# Patient Record
Sex: Female | Born: 1935 | Race: Black or African American | Hispanic: No | Smoking: Former smoker
Health system: Southern US, Community
[De-identification: ages and names within clinical notes are randomized; demographics above are authoritative.]

## PROBLEM LIST (undated history)

## (undated) DIAGNOSIS — I10 Essential (primary) hypertension: Secondary | ICD-10-CM

## (undated) DIAGNOSIS — I639 Cerebral infarction, unspecified: Secondary | ICD-10-CM

## (undated) DIAGNOSIS — N189 Chronic kidney disease, unspecified: Secondary | ICD-10-CM

## (undated) DIAGNOSIS — I2510 Atherosclerotic heart disease of native coronary artery without angina pectoris: Secondary | ICD-10-CM

## (undated) DIAGNOSIS — M069 Rheumatoid arthritis, unspecified: Secondary | ICD-10-CM

## (undated) DIAGNOSIS — I2721 Secondary pulmonary arterial hypertension: Secondary | ICD-10-CM

## (undated) DIAGNOSIS — I4891 Unspecified atrial fibrillation: Secondary | ICD-10-CM

## (undated) HISTORY — DX: Essential (primary) hypertension: I10

## (undated) HISTORY — DX: Secondary pulmonary arterial hypertension: I27.21

## (undated) HISTORY — DX: Unspecified atrial fibrillation: I48.91

## (undated) HISTORY — DX: Rheumatoid arthritis, unspecified: M06.9

## (undated) HISTORY — DX: Chronic kidney disease, unspecified: N18.9

---

## 1998-05-07 ENCOUNTER — Encounter: Admission: RE | Admit: 1998-05-07 | Discharge: 1998-05-07 | Payer: Self-pay | Admitting: Internal Medicine

## 1998-05-18 ENCOUNTER — Encounter: Admission: RE | Admit: 1998-05-18 | Discharge: 1998-05-18 | Payer: Self-pay | Admitting: Internal Medicine

## 1998-05-28 ENCOUNTER — Encounter: Admission: RE | Admit: 1998-05-28 | Discharge: 1998-05-28 | Payer: Self-pay | Admitting: Internal Medicine

## 1998-08-05 ENCOUNTER — Encounter: Admission: RE | Admit: 1998-08-05 | Discharge: 1998-08-05 | Payer: Self-pay | Admitting: Internal Medicine

## 1999-01-19 ENCOUNTER — Encounter: Admission: RE | Admit: 1999-01-19 | Discharge: 1999-01-19 | Payer: Self-pay | Admitting: Hematology and Oncology

## 1999-07-19 ENCOUNTER — Encounter: Admission: RE | Admit: 1999-07-19 | Discharge: 1999-07-19 | Payer: Self-pay | Admitting: Internal Medicine

## 2000-01-10 ENCOUNTER — Ambulatory Visit (HOSPITAL_COMMUNITY): Admission: RE | Admit: 2000-01-10 | Discharge: 2000-01-10 | Payer: Self-pay | Admitting: Internal Medicine

## 2000-01-10 ENCOUNTER — Encounter: Admission: RE | Admit: 2000-01-10 | Discharge: 2000-01-10 | Payer: Self-pay | Admitting: Internal Medicine

## 2000-01-10 ENCOUNTER — Encounter: Payer: Self-pay | Admitting: Internal Medicine

## 2000-07-06 ENCOUNTER — Encounter: Admission: RE | Admit: 2000-07-06 | Discharge: 2000-07-06 | Payer: Self-pay | Admitting: Hematology and Oncology

## 2000-07-21 ENCOUNTER — Encounter: Admission: RE | Admit: 2000-07-21 | Discharge: 2000-07-21 | Payer: Self-pay | Admitting: Internal Medicine

## 2000-08-31 ENCOUNTER — Encounter: Admission: RE | Admit: 2000-08-31 | Discharge: 2000-08-31 | Payer: Self-pay | Admitting: Hematology and Oncology

## 2000-12-28 ENCOUNTER — Encounter: Admission: RE | Admit: 2000-12-28 | Discharge: 2000-12-28 | Payer: Self-pay | Admitting: Hematology and Oncology

## 2001-01-24 ENCOUNTER — Encounter: Admission: RE | Admit: 2001-01-24 | Discharge: 2001-01-24 | Payer: Self-pay | Admitting: Hematology and Oncology

## 2001-01-29 HISTORY — PX: CARDIAC CATHETERIZATION: SHX172

## 2001-01-31 ENCOUNTER — Encounter: Admission: RE | Admit: 2001-01-31 | Discharge: 2001-01-31 | Payer: Self-pay | Admitting: Hematology and Oncology

## 2001-02-15 ENCOUNTER — Encounter: Admission: RE | Admit: 2001-02-15 | Discharge: 2001-02-15 | Payer: Self-pay | Admitting: Hematology and Oncology

## 2001-02-15 ENCOUNTER — Ambulatory Visit (HOSPITAL_COMMUNITY): Admission: RE | Admit: 2001-02-15 | Discharge: 2001-02-15 | Payer: Self-pay | Admitting: Hematology and Oncology

## 2001-02-17 ENCOUNTER — Encounter: Payer: Self-pay | Admitting: Emergency Medicine

## 2001-02-17 ENCOUNTER — Inpatient Hospital Stay (HOSPITAL_COMMUNITY): Admission: EM | Admit: 2001-02-17 | Discharge: 2001-02-21 | Payer: Self-pay | Admitting: Emergency Medicine

## 2001-02-20 ENCOUNTER — Encounter: Payer: Self-pay | Admitting: *Deleted

## 2001-03-02 ENCOUNTER — Encounter: Admission: RE | Admit: 2001-03-02 | Discharge: 2001-03-02 | Payer: Self-pay | Admitting: Internal Medicine

## 2001-04-02 ENCOUNTER — Encounter: Admission: RE | Admit: 2001-04-02 | Discharge: 2001-04-02 | Payer: Self-pay | Admitting: Internal Medicine

## 2001-07-30 ENCOUNTER — Encounter: Admission: RE | Admit: 2001-07-30 | Discharge: 2001-07-30 | Payer: Self-pay | Admitting: Internal Medicine

## 2001-08-02 ENCOUNTER — Ambulatory Visit (HOSPITAL_COMMUNITY): Admission: RE | Admit: 2001-08-02 | Discharge: 2001-08-02 | Payer: Self-pay | Admitting: Internal Medicine

## 2002-01-14 ENCOUNTER — Encounter: Admission: RE | Admit: 2002-01-14 | Discharge: 2002-01-14 | Payer: Self-pay | Admitting: Internal Medicine

## 2002-02-11 ENCOUNTER — Encounter: Admission: RE | Admit: 2002-02-11 | Discharge: 2002-02-11 | Payer: Self-pay | Admitting: Internal Medicine

## 2002-07-29 ENCOUNTER — Encounter: Admission: RE | Admit: 2002-07-29 | Discharge: 2002-07-29 | Payer: Self-pay | Admitting: Internal Medicine

## 2002-07-29 ENCOUNTER — Ambulatory Visit (HOSPITAL_COMMUNITY): Admission: RE | Admit: 2002-07-29 | Discharge: 2002-07-29 | Payer: Self-pay | Admitting: Internal Medicine

## 2002-08-09 ENCOUNTER — Encounter: Payer: Self-pay | Admitting: Internal Medicine

## 2002-08-09 ENCOUNTER — Encounter: Admission: RE | Admit: 2002-08-09 | Discharge: 2002-08-09 | Payer: Self-pay | Admitting: Internal Medicine

## 2002-08-14 ENCOUNTER — Ambulatory Visit (HOSPITAL_COMMUNITY): Admission: RE | Admit: 2002-08-14 | Discharge: 2002-08-14 | Payer: Self-pay | Admitting: Internal Medicine

## 2002-08-14 ENCOUNTER — Encounter: Payer: Self-pay | Admitting: Cardiology

## 2002-08-19 ENCOUNTER — Encounter: Admission: RE | Admit: 2002-08-19 | Discharge: 2002-08-19 | Payer: Self-pay | Admitting: Internal Medicine

## 2002-09-16 ENCOUNTER — Encounter: Admission: RE | Admit: 2002-09-16 | Discharge: 2002-09-16 | Payer: Self-pay | Admitting: Internal Medicine

## 2003-01-03 ENCOUNTER — Encounter: Admission: RE | Admit: 2003-01-03 | Discharge: 2003-01-03 | Payer: Self-pay | Admitting: Internal Medicine

## 2003-08-07 ENCOUNTER — Encounter: Admission: RE | Admit: 2003-08-07 | Discharge: 2003-08-07 | Payer: Self-pay | Admitting: Internal Medicine

## 2003-08-15 ENCOUNTER — Encounter: Admission: RE | Admit: 2003-08-15 | Discharge: 2003-08-15 | Payer: Self-pay | Admitting: Internal Medicine

## 2004-02-23 ENCOUNTER — Encounter: Admission: RE | Admit: 2004-02-23 | Discharge: 2004-02-23 | Payer: Self-pay | Admitting: Internal Medicine

## 2004-03-03 ENCOUNTER — Encounter: Admission: RE | Admit: 2004-03-03 | Discharge: 2004-03-03 | Payer: Self-pay | Admitting: Internal Medicine

## 2004-05-25 ENCOUNTER — Encounter: Admission: RE | Admit: 2004-05-25 | Discharge: 2004-05-25 | Payer: Self-pay | Admitting: Internal Medicine

## 2004-06-02 ENCOUNTER — Encounter: Admission: RE | Admit: 2004-06-02 | Discharge: 2004-06-02 | Payer: Self-pay | Admitting: *Deleted

## 2004-06-30 ENCOUNTER — Emergency Department (HOSPITAL_COMMUNITY): Admission: EM | Admit: 2004-06-30 | Discharge: 2004-06-30 | Payer: Self-pay | Admitting: Emergency Medicine

## 2004-07-09 ENCOUNTER — Ambulatory Visit: Payer: Self-pay | Admitting: Internal Medicine

## 2004-10-05 ENCOUNTER — Ambulatory Visit: Payer: Self-pay | Admitting: Internal Medicine

## 2004-10-19 ENCOUNTER — Ambulatory Visit: Payer: Self-pay | Admitting: Internal Medicine

## 2004-12-07 ENCOUNTER — Ambulatory Visit (HOSPITAL_COMMUNITY): Admission: RE | Admit: 2004-12-07 | Discharge: 2004-12-07 | Payer: Self-pay | Admitting: Internal Medicine

## 2004-12-07 ENCOUNTER — Ambulatory Visit: Payer: Self-pay | Admitting: Internal Medicine

## 2005-01-17 ENCOUNTER — Ambulatory Visit (HOSPITAL_COMMUNITY): Admission: RE | Admit: 2005-01-17 | Discharge: 2005-01-17 | Payer: Self-pay | Admitting: *Deleted

## 2005-02-25 ENCOUNTER — Ambulatory Visit (HOSPITAL_COMMUNITY): Admission: RE | Admit: 2005-02-25 | Discharge: 2005-02-25 | Payer: Self-pay | Admitting: Orthopedic Surgery

## 2005-03-04 ENCOUNTER — Ambulatory Visit: Payer: Self-pay | Admitting: Internal Medicine

## 2005-04-11 ENCOUNTER — Ambulatory Visit: Payer: Self-pay | Admitting: Internal Medicine

## 2005-06-02 ENCOUNTER — Ambulatory Visit: Payer: Self-pay | Admitting: Internal Medicine

## 2005-06-09 ENCOUNTER — Ambulatory Visit: Payer: Self-pay | Admitting: Internal Medicine

## 2005-06-17 ENCOUNTER — Ambulatory Visit: Payer: Self-pay | Admitting: Internal Medicine

## 2005-06-27 ENCOUNTER — Ambulatory Visit: Payer: Self-pay | Admitting: Cardiology

## 2005-07-05 ENCOUNTER — Ambulatory Visit: Payer: Self-pay | Admitting: Internal Medicine

## 2005-07-14 ENCOUNTER — Ambulatory Visit: Payer: Self-pay | Admitting: Internal Medicine

## 2005-10-13 ENCOUNTER — Ambulatory Visit: Payer: Self-pay | Admitting: Internal Medicine

## 2006-03-01 ENCOUNTER — Ambulatory Visit: Payer: Self-pay | Admitting: Internal Medicine

## 2006-03-15 ENCOUNTER — Encounter: Admission: RE | Admit: 2006-03-15 | Discharge: 2006-03-15 | Payer: Self-pay | Admitting: Internal Medicine

## 2006-04-20 ENCOUNTER — Encounter: Admission: RE | Admit: 2006-04-20 | Discharge: 2006-04-20 | Payer: Self-pay | Admitting: Otolaryngology

## 2006-05-09 ENCOUNTER — Ambulatory Visit: Payer: Self-pay | Admitting: Hospitalist

## 2006-08-22 ENCOUNTER — Ambulatory Visit: Payer: Self-pay | Admitting: Internal Medicine

## 2006-08-22 ENCOUNTER — Encounter (INDEPENDENT_AMBULATORY_CARE_PROVIDER_SITE_OTHER): Payer: Self-pay | Admitting: *Deleted

## 2006-08-22 LAB — CONVERTED CEMR LAB
ALT: 17 units/L (ref 0–40)
HCT: 41.1 % (ref 36.0–46.0)
Hemoglobin: 14.1 g/dL (ref 12.0–15.0)
MCV: 89.5 fL (ref 78.0–100.0)
Potassium: 3.3 meq/L — ABNORMAL LOW (ref 3.5–5.3)
RBC: 4.59 M/uL (ref 3.87–5.11)
TSH: 3.217 microintl units/mL (ref 0.350–5.50)

## 2006-11-13 ENCOUNTER — Encounter: Admission: RE | Admit: 2006-11-13 | Discharge: 2006-11-13 | Payer: Self-pay | Admitting: Orthopedic Surgery

## 2007-01-23 ENCOUNTER — Telehealth: Payer: Self-pay | Admitting: *Deleted

## 2007-01-24 ENCOUNTER — Telehealth: Payer: Self-pay | Admitting: *Deleted

## 2007-02-06 ENCOUNTER — Ambulatory Visit: Payer: Self-pay | Admitting: *Deleted

## 2007-02-06 DIAGNOSIS — M1A9XX1 Chronic gout, unspecified, with tophus (tophi): Secondary | ICD-10-CM

## 2007-02-06 DIAGNOSIS — N259 Disorder resulting from impaired renal tubular function, unspecified: Secondary | ICD-10-CM | POA: Insufficient documentation

## 2007-02-06 DIAGNOSIS — I10 Essential (primary) hypertension: Secondary | ICD-10-CM | POA: Insufficient documentation

## 2007-02-06 DIAGNOSIS — E785 Hyperlipidemia, unspecified: Secondary | ICD-10-CM

## 2007-02-06 DIAGNOSIS — K449 Diaphragmatic hernia without obstruction or gangrene: Secondary | ICD-10-CM | POA: Insufficient documentation

## 2007-02-06 DIAGNOSIS — K219 Gastro-esophageal reflux disease without esophagitis: Secondary | ICD-10-CM

## 2007-02-06 DIAGNOSIS — R5381 Other malaise: Secondary | ICD-10-CM | POA: Insufficient documentation

## 2007-02-06 DIAGNOSIS — H409 Unspecified glaucoma: Secondary | ICD-10-CM | POA: Insufficient documentation

## 2007-02-06 DIAGNOSIS — E876 Hypokalemia: Secondary | ICD-10-CM | POA: Insufficient documentation

## 2007-02-06 DIAGNOSIS — J84111 Idiopathic interstitial pneumonia, not otherwise specified: Secondary | ICD-10-CM | POA: Insufficient documentation

## 2007-02-06 DIAGNOSIS — R5383 Other fatigue: Secondary | ICD-10-CM

## 2007-02-06 DIAGNOSIS — F411 Generalized anxiety disorder: Secondary | ICD-10-CM | POA: Insufficient documentation

## 2007-02-06 DIAGNOSIS — M069 Rheumatoid arthritis, unspecified: Secondary | ICD-10-CM | POA: Insufficient documentation

## 2007-02-06 LAB — CONVERTED CEMR LAB
Blood Glucose, Fingerstick: 169
Calcium: 8.8 mg/dL (ref 8.4–10.5)
Creatinine, Ser: 1.61 mg/dL — ABNORMAL HIGH (ref 0.40–1.20)
Glucose, Bld: 173 mg/dL — ABNORMAL HIGH (ref 70–99)
Hgb A1c MFr Bld: 5.7 %
Potassium: 3.1 meq/L — ABNORMAL LOW (ref 3.5–5.3)
Sodium: 144 meq/L (ref 135–145)

## 2007-02-13 ENCOUNTER — Encounter (INDEPENDENT_AMBULATORY_CARE_PROVIDER_SITE_OTHER): Payer: Self-pay | Admitting: *Deleted

## 2007-02-21 ENCOUNTER — Ambulatory Visit: Payer: Self-pay | Admitting: Hospitalist

## 2007-02-21 ENCOUNTER — Encounter (INDEPENDENT_AMBULATORY_CARE_PROVIDER_SITE_OTHER): Payer: Self-pay | Admitting: *Deleted

## 2007-02-21 LAB — CONVERTED CEMR LAB
Calcium: 9.3 mg/dL (ref 8.4–10.5)
Chloride: 103 meq/L (ref 96–112)
Cholesterol: 206 mg/dL — ABNORMAL HIGH (ref 0–200)
Creatinine, Ser: 1.62 mg/dL — ABNORMAL HIGH (ref 0.40–1.20)
Glucose, Bld: 124 mg/dL — ABNORMAL HIGH (ref 70–99)
HDL: 60 mg/dL (ref >39)
VLDL: 37 mg/dL (ref 0–40)

## 2007-02-27 ENCOUNTER — Encounter (INDEPENDENT_AMBULATORY_CARE_PROVIDER_SITE_OTHER): Payer: Self-pay | Admitting: *Deleted

## 2007-07-17 ENCOUNTER — Ambulatory Visit: Payer: Self-pay | Admitting: *Deleted

## 2007-07-17 DIAGNOSIS — R634 Abnormal weight loss: Secondary | ICD-10-CM

## 2007-07-17 LAB — CONVERTED CEMR LAB: Hgb A1c MFr Bld: 5.2 %

## 2007-07-19 LAB — CONVERTED CEMR LAB
ALT: 17 units/L (ref 0–35)
Alkaline Phosphatase: 121 units/L — ABNORMAL HIGH (ref 39–117)
Calcium: 9.5 mg/dL (ref 8.4–10.5)
Creatinine, Ser: 1.56 mg/dL — ABNORMAL HIGH (ref 0.40–1.20)
Glucose, Bld: 95 mg/dL (ref 70–99)
Total Bilirubin: 1.1 mg/dL (ref 0.3–1.2)
Total Protein: 7.4 g/dL (ref 6.0–8.3)

## 2007-08-29 ENCOUNTER — Encounter (INDEPENDENT_AMBULATORY_CARE_PROVIDER_SITE_OTHER): Payer: Self-pay | Admitting: *Deleted

## 2007-09-01 HISTORY — PX: OTHER SURGICAL HISTORY: SHX169

## 2007-09-04 ENCOUNTER — Ambulatory Visit: Payer: Self-pay | Admitting: *Deleted

## 2007-09-05 LAB — CONVERTED CEMR LAB
BUN: 26 mg/dL — ABNORMAL HIGH (ref 6–23)
CO2: 24 meq/L (ref 19–32)
Calcium: 9.4 mg/dL (ref 8.4–10.5)
Chloride: 101 meq/L (ref 96–112)
Glucose, Bld: 120 mg/dL — ABNORMAL HIGH (ref 70–99)
Potassium: 3.2 meq/L — ABNORMAL LOW (ref 3.5–5.3)

## 2007-09-10 ENCOUNTER — Ambulatory Visit: Payer: Self-pay | Admitting: Cardiology

## 2007-09-14 ENCOUNTER — Encounter: Payer: Self-pay | Admitting: Internal Medicine

## 2007-09-14 ENCOUNTER — Ambulatory Visit: Payer: Self-pay | Admitting: Internal Medicine

## 2007-09-14 ENCOUNTER — Inpatient Hospital Stay (HOSPITAL_COMMUNITY): Admission: RE | Admit: 2007-09-14 | Discharge: 2007-09-15 | Payer: Self-pay | Admitting: Ophthalmology

## 2007-09-24 ENCOUNTER — Ambulatory Visit: Payer: Self-pay | Admitting: Internal Medicine

## 2007-09-24 DIAGNOSIS — I4891 Unspecified atrial fibrillation: Secondary | ICD-10-CM

## 2007-10-12 ENCOUNTER — Telehealth (INDEPENDENT_AMBULATORY_CARE_PROVIDER_SITE_OTHER): Payer: Self-pay | Admitting: *Deleted

## 2007-10-15 ENCOUNTER — Ambulatory Visit: Payer: Self-pay | Admitting: Internal Medicine

## 2007-10-15 LAB — CONVERTED CEMR LAB: INR: 2

## 2007-10-29 ENCOUNTER — Telehealth: Payer: Self-pay | Admitting: *Deleted

## 2007-11-05 ENCOUNTER — Ambulatory Visit: Payer: Self-pay | Admitting: Internal Medicine

## 2007-11-05 LAB — CONVERTED CEMR LAB: INR: 2.4

## 2007-11-27 ENCOUNTER — Telehealth: Payer: Self-pay | Admitting: *Deleted

## 2007-12-03 ENCOUNTER — Ambulatory Visit: Payer: Self-pay | Admitting: Hospitalist

## 2007-12-03 LAB — CONVERTED CEMR LAB

## 2007-12-15 ENCOUNTER — Emergency Department (HOSPITAL_COMMUNITY): Admission: EM | Admit: 2007-12-15 | Discharge: 2007-12-15 | Payer: Self-pay | Admitting: Emergency Medicine

## 2007-12-24 ENCOUNTER — Ambulatory Visit: Payer: Self-pay | Admitting: Internal Medicine

## 2007-12-24 LAB — CONVERTED CEMR LAB

## 2008-01-03 ENCOUNTER — Ambulatory Visit: Payer: Self-pay | Admitting: *Deleted

## 2008-01-21 ENCOUNTER — Ambulatory Visit: Payer: Self-pay | Admitting: *Deleted

## 2008-01-21 LAB — CONVERTED CEMR LAB: INR: 2.8

## 2008-02-25 ENCOUNTER — Ambulatory Visit: Payer: Self-pay | Admitting: Hospitalist

## 2008-02-26 ENCOUNTER — Telehealth: Payer: Self-pay | Admitting: *Deleted

## 2008-03-11 ENCOUNTER — Ambulatory Visit: Payer: Self-pay | Admitting: *Deleted

## 2008-03-11 ENCOUNTER — Ambulatory Visit (HOSPITAL_COMMUNITY): Admission: RE | Admit: 2008-03-11 | Discharge: 2008-03-11 | Payer: Self-pay | Admitting: *Deleted

## 2008-03-11 ENCOUNTER — Encounter: Payer: Self-pay | Admitting: Pharmacist

## 2008-03-18 ENCOUNTER — Telehealth (INDEPENDENT_AMBULATORY_CARE_PROVIDER_SITE_OTHER): Payer: Self-pay | Admitting: *Deleted

## 2008-03-19 ENCOUNTER — Ambulatory Visit (HOSPITAL_COMMUNITY): Admission: RE | Admit: 2008-03-19 | Discharge: 2008-03-19 | Payer: Self-pay | Admitting: *Deleted

## 2008-03-19 ENCOUNTER — Ambulatory Visit: Payer: Self-pay | Admitting: Cardiovascular Disease

## 2008-03-19 ENCOUNTER — Encounter (INDEPENDENT_AMBULATORY_CARE_PROVIDER_SITE_OTHER): Payer: Self-pay | Admitting: *Deleted

## 2008-03-31 ENCOUNTER — Ambulatory Visit: Payer: Self-pay | Admitting: Internal Medicine

## 2008-03-31 LAB — CONVERTED CEMR LAB

## 2008-04-09 ENCOUNTER — Encounter: Admission: RE | Admit: 2008-04-09 | Discharge: 2008-04-09 | Payer: Self-pay | Admitting: *Deleted

## 2008-04-21 ENCOUNTER — Ambulatory Visit: Payer: Self-pay | Admitting: Internal Medicine

## 2008-04-21 LAB — CONVERTED CEMR LAB: INR: 2.3

## 2008-04-28 ENCOUNTER — Telehealth: Payer: Self-pay | Admitting: *Deleted

## 2008-05-06 ENCOUNTER — Ambulatory Visit: Payer: Self-pay | Admitting: *Deleted

## 2008-05-19 ENCOUNTER — Ambulatory Visit: Payer: Self-pay | Admitting: Infectious Diseases

## 2008-05-19 LAB — CONVERTED CEMR LAB

## 2008-05-30 ENCOUNTER — Telehealth (INDEPENDENT_AMBULATORY_CARE_PROVIDER_SITE_OTHER): Payer: Self-pay | Admitting: *Deleted

## 2008-06-02 ENCOUNTER — Ambulatory Visit: Payer: Self-pay | Admitting: Infectious Diseases

## 2008-06-02 LAB — CONVERTED CEMR LAB: INR: 1.1

## 2008-06-25 ENCOUNTER — Telehealth: Payer: Self-pay | Admitting: *Deleted

## 2008-06-30 ENCOUNTER — Ambulatory Visit: Payer: Self-pay | Admitting: Internal Medicine

## 2008-06-30 LAB — CONVERTED CEMR LAB

## 2008-07-24 ENCOUNTER — Encounter (INDEPENDENT_AMBULATORY_CARE_PROVIDER_SITE_OTHER): Payer: Self-pay | Admitting: *Deleted

## 2008-07-24 ENCOUNTER — Ambulatory Visit: Payer: Self-pay | Admitting: Internal Medicine

## 2008-07-24 DIAGNOSIS — R51 Headache: Secondary | ICD-10-CM

## 2008-07-24 DIAGNOSIS — R519 Headache, unspecified: Secondary | ICD-10-CM | POA: Insufficient documentation

## 2008-07-25 DIAGNOSIS — E079 Disorder of thyroid, unspecified: Secondary | ICD-10-CM | POA: Insufficient documentation

## 2008-07-25 LAB — CONVERTED CEMR LAB
AST: 35 units/L (ref 0–37)
Albumin: 3.1 g/dL — ABNORMAL LOW (ref 3.5–5.2)
CO2: 22 meq/L (ref 19–32)
Calcium: 9.4 mg/dL (ref 8.4–10.5)
Glucose, Bld: 113 mg/dL — ABNORMAL HIGH (ref 70–99)
HCT: 40.7 % (ref 36.0–46.0)
Lymphs Abs: 1 10*3/uL (ref 0.7–4.0)
MCHC: 35.1 g/dL (ref 30.0–36.0)
MCV: 90.3 fL (ref 78.0–100.0)
Monocytes Relative: 13 % — ABNORMAL HIGH (ref 3–12)
RBC: 4.51 M/uL (ref 3.87–5.11)
Sed Rate: 2 mm/hr (ref 0–22)
Sodium: 142 meq/L (ref 135–145)
WBC: 5.3 10*3/uL (ref 4.0–10.5)

## 2008-07-28 ENCOUNTER — Ambulatory Visit: Payer: Self-pay | Admitting: Internal Medicine

## 2008-07-28 LAB — CONVERTED CEMR LAB: INR: 3.4

## 2008-08-02 ENCOUNTER — Ambulatory Visit (HOSPITAL_COMMUNITY): Admission: RE | Admit: 2008-08-02 | Discharge: 2008-08-02 | Payer: Self-pay | Admitting: *Deleted

## 2008-08-04 ENCOUNTER — Telehealth: Payer: Self-pay | Admitting: *Deleted

## 2008-08-04 DIAGNOSIS — I6789 Other cerebrovascular disease: Secondary | ICD-10-CM

## 2008-08-18 ENCOUNTER — Ambulatory Visit: Payer: Self-pay | Admitting: Internal Medicine

## 2008-08-18 ENCOUNTER — Encounter (INDEPENDENT_AMBULATORY_CARE_PROVIDER_SITE_OTHER): Payer: Self-pay | Admitting: *Deleted

## 2008-08-19 ENCOUNTER — Ambulatory Visit: Payer: Self-pay | Admitting: Internal Medicine

## 2008-08-21 LAB — CONVERTED CEMR LAB
Free T4: 0.69 ng/dL — ABNORMAL LOW (ref 0.89–1.80)
TSH: 4.468 microintl units/mL (ref 0.350–4.50)

## 2008-09-02 ENCOUNTER — Ambulatory Visit: Payer: Self-pay | Admitting: *Deleted

## 2008-09-02 ENCOUNTER — Encounter (INDEPENDENT_AMBULATORY_CARE_PROVIDER_SITE_OTHER): Payer: Self-pay | Admitting: Internal Medicine

## 2008-09-03 LAB — CONVERTED CEMR LAB
Calcium: 9.2 mg/dL (ref 8.4–10.5)
Creatinine, Ser: 1.65 mg/dL — ABNORMAL HIGH (ref 0.40–1.20)
Glucose, Bld: 132 mg/dL — ABNORMAL HIGH (ref 70–99)
Sodium: 142 meq/L (ref 135–145)

## 2008-09-16 ENCOUNTER — Ambulatory Visit: Payer: Self-pay | Admitting: *Deleted

## 2008-09-16 LAB — CONVERTED CEMR LAB
Cholesterol: 205 mg/dL — ABNORMAL HIGH (ref 0–200)
HDL: 46 mg/dL (ref >39)
INR: 1.4
Total CHOL/HDL Ratio: 4.5
Triglycerides: 259 mg/dL — ABNORMAL HIGH (ref <150)
VLDL: 52 mg/dL — ABNORMAL HIGH (ref 0–40)

## 2008-09-22 ENCOUNTER — Ambulatory Visit: Payer: Self-pay | Admitting: *Deleted

## 2008-09-22 ENCOUNTER — Encounter: Payer: Self-pay | Admitting: Pharmacist

## 2008-09-22 LAB — CONVERTED CEMR LAB: INR: 1.8

## 2008-10-13 ENCOUNTER — Ambulatory Visit: Payer: Self-pay | Admitting: Internal Medicine

## 2008-11-03 ENCOUNTER — Telehealth (INDEPENDENT_AMBULATORY_CARE_PROVIDER_SITE_OTHER): Payer: Self-pay | Admitting: *Deleted

## 2008-11-03 ENCOUNTER — Ambulatory Visit: Payer: Self-pay | Admitting: Internal Medicine

## 2008-11-03 LAB — CONVERTED CEMR LAB: INR: 1.9

## 2008-11-04 ENCOUNTER — Encounter (INDEPENDENT_AMBULATORY_CARE_PROVIDER_SITE_OTHER): Payer: Self-pay | Admitting: *Deleted

## 2008-11-14 IMAGING — CR DG KNEE 1-2V*L*
2 series · 2 of 2 positions shown · non-contrast
Comparison: None available.

CLINICAL DATA: Bilateral hip pain.   Patient with rheumatoid arthritis. 
SINGLE VIEW OF PELVIS AND TWO VIEWS OF EACH HIP:

[t knee ap left]
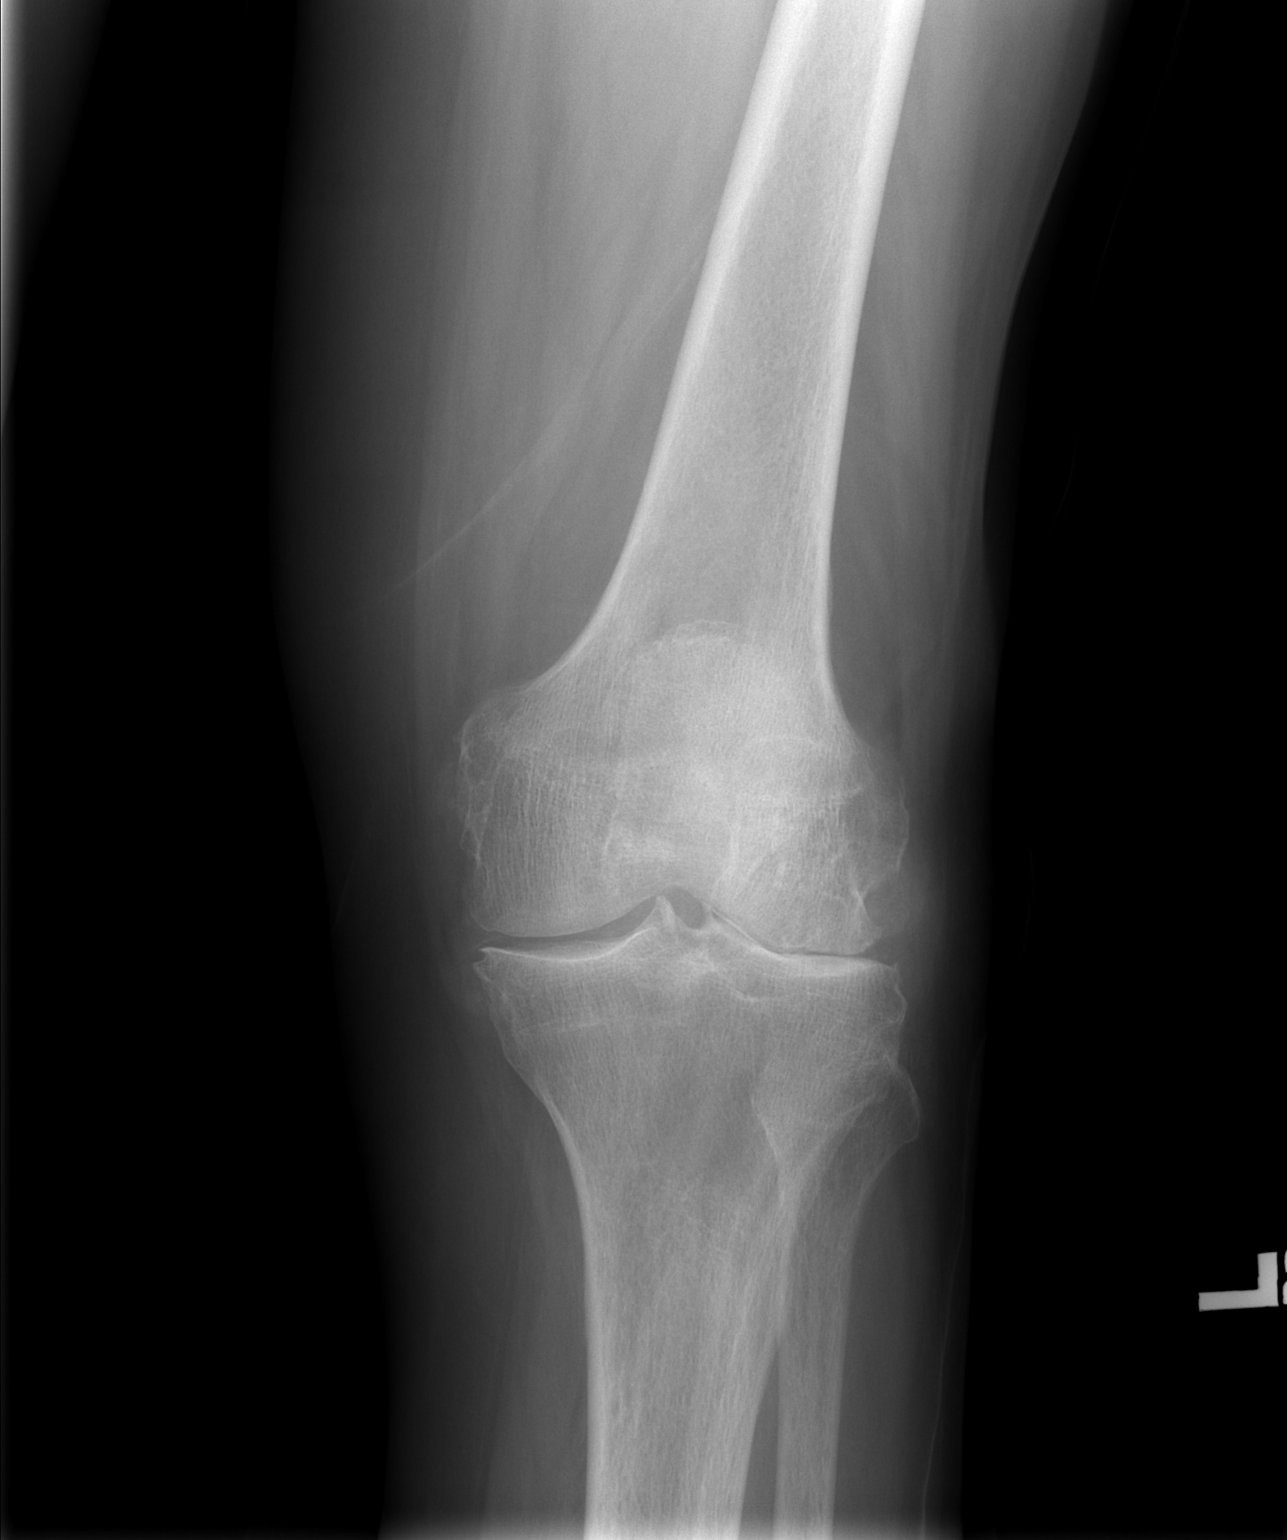

[t knee lat left]
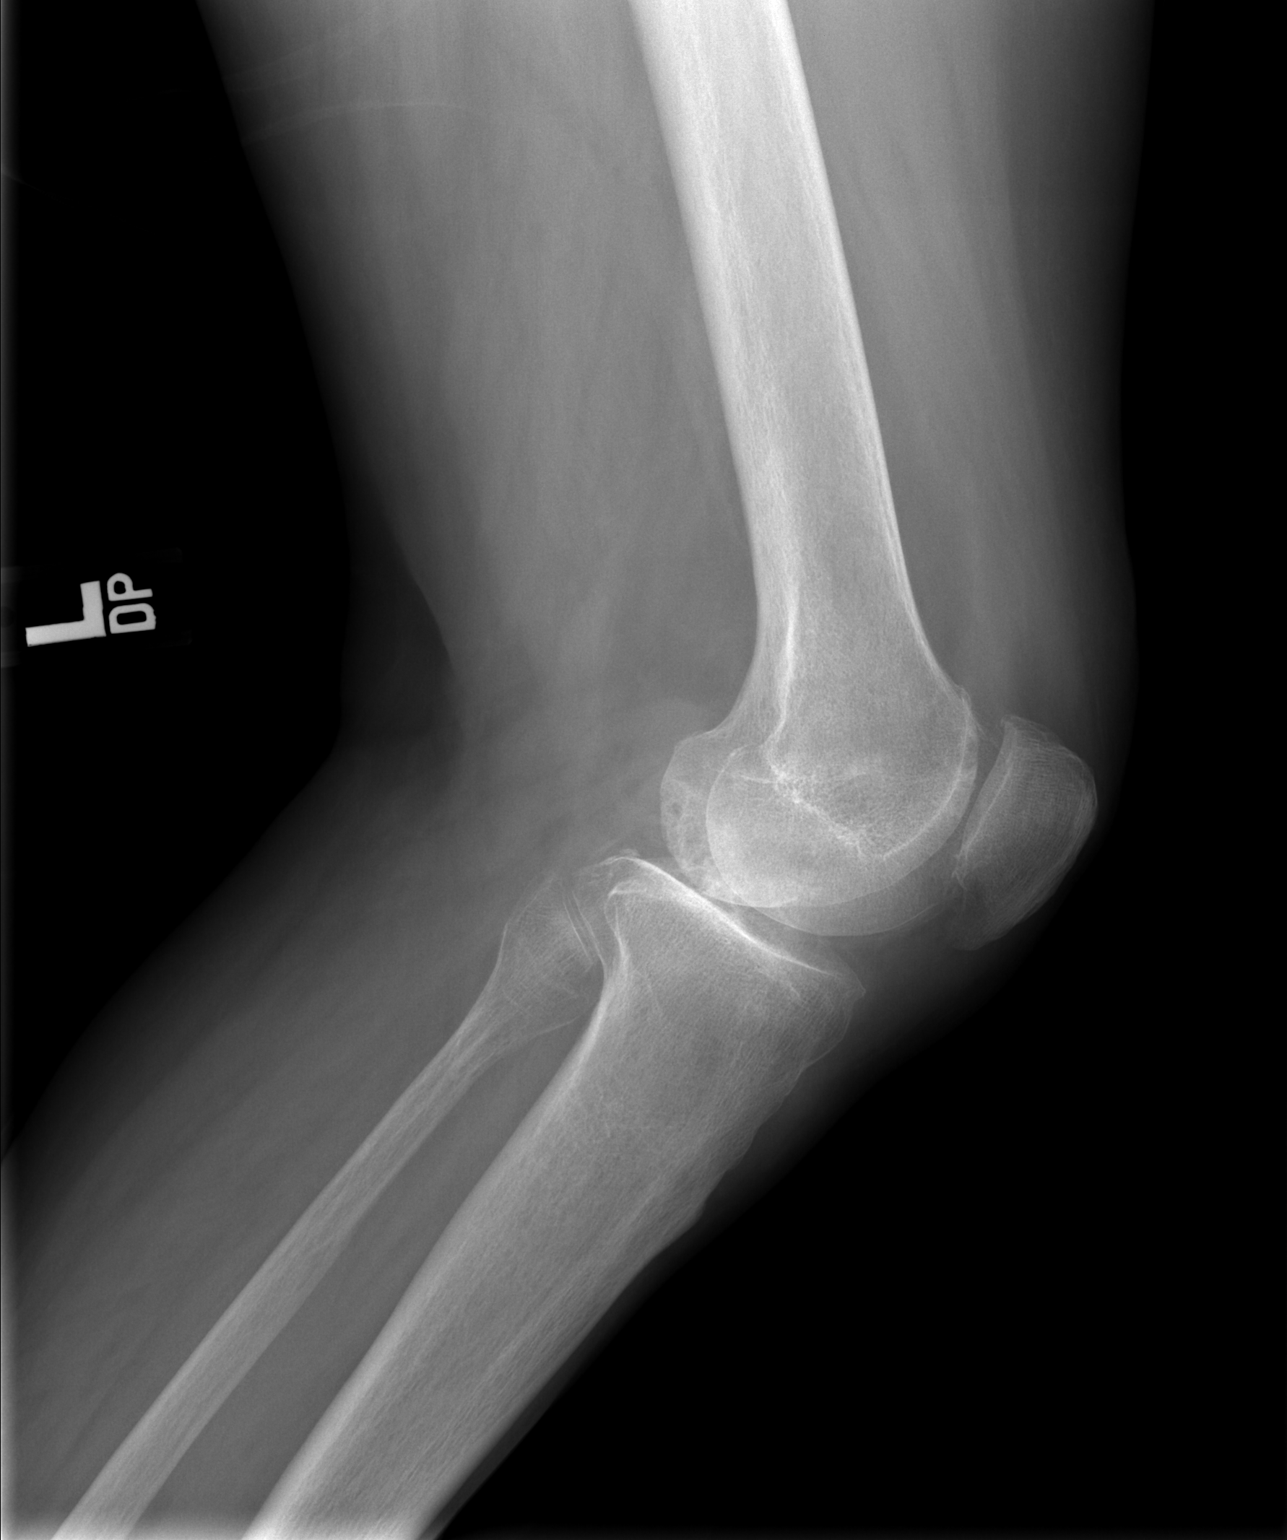

[2 of 2 positions shown; findings below may reference images not displayed]

FINDINGS: The patient does have bilateral axial joint space narrowing compatible with given diagnosis of rheumatoid arthritis.  The bones are somewhat osteopenic.  There is no fracture.  No notable acetabular protrusio.
IMPRESSION: 1.  Bilateral axial joint space narrowing of the hips compatible with the given diagnosis of rheumatoid arthritis.
2.  Osteopenia. 
LEFT KNEE - 2 VIEW:
FINDINGS: There is bone-on-bone joint space narrowing in the lateral compartment with marked medial compartment joint space narrowing also identified.  There is evidence of erosive change along the lateral femoral condyle.  A small joint effusion is noted.  Small osteophytes are present in all three compartments.  Chondrocalcinosis is visualized in the medial compartment.  The bones are osteopenic.   No fracture.
IMPRESSION: Findings compatible with both the given diagnosis rheumatoid arthritis and osteoarthritis about the knee.  The changes are most severe in the lateral compartment. 
RIGHT KNEE - 2 VIEW:
FINDINGS: There is marked tricompartmental joint space narrowing.  Subchondral cyst formation and possibly erosions are identified in the patellofemoral compartment and the lateral compartment.  Chondrocalcinosis is noted in both the medial and lateral compartments.  There is a small joint effusion.  No fracture.
IMPRESSION: Findings compatible with both rheumatoid arthritis and osteoarthritis of the knee, worst in the lateral and patellofemoral compartments.  Note is made that the patellofemoral compartment is much more severely affected in the right knee compared to the left.

## 2008-12-08 ENCOUNTER — Ambulatory Visit: Payer: Self-pay | Admitting: Pulmonary Disease

## 2008-12-08 ENCOUNTER — Ambulatory Visit: Payer: Self-pay | Admitting: Infectious Disease

## 2008-12-08 ENCOUNTER — Observation Stay (HOSPITAL_COMMUNITY): Admission: EM | Admit: 2008-12-08 | Discharge: 2008-12-10 | Payer: Self-pay | Admitting: Emergency Medicine

## 2008-12-09 ENCOUNTER — Encounter (INDEPENDENT_AMBULATORY_CARE_PROVIDER_SITE_OTHER): Payer: Self-pay | Admitting: Emergency Medicine

## 2008-12-25 ENCOUNTER — Ambulatory Visit: Payer: Self-pay | Admitting: Internal Medicine

## 2008-12-25 LAB — CONVERTED CEMR LAB

## 2009-01-05 ENCOUNTER — Encounter: Payer: Self-pay | Admitting: Internal Medicine

## 2009-01-05 ENCOUNTER — Ambulatory Visit: Payer: Self-pay | Admitting: Internal Medicine

## 2009-01-05 DIAGNOSIS — I5032 Chronic diastolic (congestive) heart failure: Secondary | ICD-10-CM

## 2009-01-05 LAB — CONVERTED CEMR LAB
BUN: 20 mg/dL (ref 6–23)
Calcium: 9 mg/dL (ref 8.4–10.5)
Chloride: 102 meq/L (ref 96–112)
Creatinine, Ser: 1.78 mg/dL — ABNORMAL HIGH (ref 0.40–1.20)
INR: 3.1

## 2009-01-13 ENCOUNTER — Telehealth (INDEPENDENT_AMBULATORY_CARE_PROVIDER_SITE_OTHER): Payer: Self-pay | Admitting: *Deleted

## 2009-01-14 ENCOUNTER — Encounter (INDEPENDENT_AMBULATORY_CARE_PROVIDER_SITE_OTHER): Payer: Self-pay | Admitting: *Deleted

## 2009-01-26 ENCOUNTER — Ambulatory Visit: Payer: Self-pay | Admitting: Internal Medicine

## 2009-01-26 LAB — CONVERTED CEMR LAB: INR: 2.1

## 2009-01-27 ENCOUNTER — Telehealth: Payer: Self-pay | Admitting: *Deleted

## 2009-02-02 ENCOUNTER — Ambulatory Visit: Payer: Self-pay | Admitting: Internal Medicine

## 2009-02-02 ENCOUNTER — Encounter (INDEPENDENT_AMBULATORY_CARE_PROVIDER_SITE_OTHER): Payer: Self-pay | Admitting: *Deleted

## 2009-02-02 LAB — FECAL OCCULT BLOOD, GUAIAC: Fecal Occult Blood: NEGATIVE

## 2009-02-05 DIAGNOSIS — K921 Melena: Secondary | ICD-10-CM | POA: Insufficient documentation

## 2009-02-17 ENCOUNTER — Ambulatory Visit: Payer: Self-pay | Admitting: Internal Medicine

## 2009-02-17 ENCOUNTER — Encounter (INDEPENDENT_AMBULATORY_CARE_PROVIDER_SITE_OTHER): Payer: Self-pay | Admitting: Internal Medicine

## 2009-02-17 DIAGNOSIS — R040 Epistaxis: Secondary | ICD-10-CM | POA: Insufficient documentation

## 2009-02-17 LAB — CONVERTED CEMR LAB
Eosinophils Absolute: 0 10*3/uL (ref 0.0–0.7)
Eosinophils Relative: 0 % (ref 0–5)
HCT: 40.9 % (ref 36.0–46.0)
Hemoglobin: 14.3 g/dL (ref 12.0–15.0)
INR: 3.9
Lymphocytes Relative: 13 % (ref 12–46)
Lymphs Abs: 0.7 10*3/uL (ref 0.7–4.0)
MCV: 90.4 fL (ref 78.0–100.0)
Monocytes Absolute: 0.6 10*3/uL (ref 0.1–1.0)
RDW: 16.1 % — ABNORMAL HIGH (ref 11.5–15.5)
WBC: 5.5 10*3/uL (ref 4.0–10.5)

## 2009-02-18 ENCOUNTER — Encounter (INDEPENDENT_AMBULATORY_CARE_PROVIDER_SITE_OTHER): Payer: Self-pay | Admitting: *Deleted

## 2009-02-23 ENCOUNTER — Ambulatory Visit: Payer: Self-pay | Admitting: Internal Medicine

## 2009-02-23 ENCOUNTER — Encounter: Payer: Self-pay | Admitting: Pharmacist

## 2009-02-23 LAB — CONVERTED CEMR LAB: INR: 2.4

## 2009-03-11 ENCOUNTER — Telehealth (INDEPENDENT_AMBULATORY_CARE_PROVIDER_SITE_OTHER): Payer: Self-pay | Admitting: *Deleted

## 2009-03-23 ENCOUNTER — Ambulatory Visit: Payer: Self-pay | Admitting: Internal Medicine

## 2009-03-23 LAB — CONVERTED CEMR LAB

## 2009-03-24 ENCOUNTER — Telehealth: Payer: Self-pay | Admitting: *Deleted

## 2009-04-02 ENCOUNTER — Encounter: Payer: Self-pay | Admitting: Internal Medicine

## 2009-04-05 ENCOUNTER — Encounter (INDEPENDENT_AMBULATORY_CARE_PROVIDER_SITE_OTHER): Payer: Self-pay | Admitting: Emergency Medicine

## 2009-04-05 ENCOUNTER — Inpatient Hospital Stay (HOSPITAL_COMMUNITY): Admission: EM | Admit: 2009-04-05 | Discharge: 2009-04-07 | Payer: Self-pay | Admitting: Emergency Medicine

## 2009-04-05 ENCOUNTER — Ambulatory Visit: Payer: Self-pay | Admitting: Vascular Surgery

## 2009-04-05 ENCOUNTER — Ambulatory Visit: Payer: Self-pay | Admitting: *Deleted

## 2009-04-07 ENCOUNTER — Encounter (INDEPENDENT_AMBULATORY_CARE_PROVIDER_SITE_OTHER): Payer: Self-pay | Admitting: *Deleted

## 2009-04-20 ENCOUNTER — Ambulatory Visit: Payer: Self-pay | Admitting: Internal Medicine

## 2009-04-20 LAB — CONVERTED CEMR LAB: INR: 5.6

## 2009-04-28 ENCOUNTER — Encounter (INDEPENDENT_AMBULATORY_CARE_PROVIDER_SITE_OTHER): Payer: Self-pay | Admitting: *Deleted

## 2009-04-29 ENCOUNTER — Telehealth (INDEPENDENT_AMBULATORY_CARE_PROVIDER_SITE_OTHER): Payer: Self-pay | Admitting: Internal Medicine

## 2009-05-11 ENCOUNTER — Ambulatory Visit: Payer: Self-pay | Admitting: Internal Medicine

## 2009-05-11 LAB — CONVERTED CEMR LAB: INR: 2.7

## 2009-05-13 ENCOUNTER — Telehealth: Payer: Self-pay | Admitting: Internal Medicine

## 2009-05-18 ENCOUNTER — Encounter: Payer: Self-pay | Admitting: Internal Medicine

## 2009-05-18 ENCOUNTER — Ambulatory Visit: Payer: Self-pay | Admitting: Internal Medicine

## 2009-05-18 DIAGNOSIS — D485 Neoplasm of uncertain behavior of skin: Secondary | ICD-10-CM

## 2009-05-18 LAB — CONVERTED CEMR LAB
Calcium: 9.5 mg/dL (ref 8.4–10.5)
Cholesterol, target level: 200 mg/dL
Creatinine, Ser: 1.98 mg/dL — ABNORMAL HIGH (ref 0.40–1.20)
HDL goal, serum: 40 mg/dL
LDL Goal: 130 mg/dL

## 2009-06-08 ENCOUNTER — Ambulatory Visit: Payer: Self-pay | Admitting: Internal Medicine

## 2009-06-08 LAB — CONVERTED CEMR LAB: INR: 3.2

## 2009-06-09 LAB — CONVERTED CEMR LAB
Calcium: 9.3 mg/dL (ref 8.4–10.5)
Glucose, Bld: 103 mg/dL — ABNORMAL HIGH (ref 70–99)
Sodium: 142 meq/L (ref 135–145)

## 2009-06-22 ENCOUNTER — Ambulatory Visit: Payer: Self-pay | Admitting: Internal Medicine

## 2009-06-23 ENCOUNTER — Encounter: Payer: Self-pay | Admitting: Internal Medicine

## 2009-06-23 LAB — CONVERTED CEMR LAB
BUN: 15 mg/dL (ref 6–23)
Chloride: 101 meq/L (ref 96–112)
Creatinine, Ser: 1.62 mg/dL — ABNORMAL HIGH (ref 0.40–1.20)
Glucose, Bld: 115 mg/dL — ABNORMAL HIGH (ref 70–99)
Sodium: 142 meq/L (ref 135–145)

## 2009-07-13 ENCOUNTER — Ambulatory Visit: Payer: Self-pay | Admitting: Infectious Diseases

## 2009-07-13 LAB — CONVERTED CEMR LAB: INR: 4.2

## 2009-07-20 ENCOUNTER — Telehealth: Payer: Self-pay | Admitting: Internal Medicine

## 2009-08-03 ENCOUNTER — Ambulatory Visit: Payer: Self-pay | Admitting: Internal Medicine

## 2009-08-12 ENCOUNTER — Ambulatory Visit (HOSPITAL_COMMUNITY): Admission: RE | Admit: 2009-08-12 | Discharge: 2009-08-12 | Payer: Self-pay | Admitting: Ophthalmology

## 2009-08-31 ENCOUNTER — Ambulatory Visit: Payer: Self-pay | Admitting: Internal Medicine

## 2009-08-31 LAB — CONVERTED CEMR LAB: INR: 2.2

## 2009-09-21 ENCOUNTER — Telehealth: Payer: Self-pay | Admitting: Internal Medicine

## 2009-09-28 ENCOUNTER — Ambulatory Visit: Payer: Self-pay | Admitting: Internal Medicine

## 2009-09-28 LAB — CONVERTED CEMR LAB: INR: 2.3

## 2009-11-02 ENCOUNTER — Ambulatory Visit: Payer: Self-pay | Admitting: Internal Medicine

## 2009-11-02 LAB — CONVERTED CEMR LAB: INR: 2

## 2009-11-20 ENCOUNTER — Telehealth: Payer: Self-pay | Admitting: Internal Medicine

## 2009-11-23 ENCOUNTER — Ambulatory Visit: Payer: Self-pay | Admitting: Internal Medicine

## 2009-12-18 ENCOUNTER — Telehealth: Payer: Self-pay | Admitting: Internal Medicine

## 2009-12-21 ENCOUNTER — Ambulatory Visit: Payer: Self-pay | Admitting: Internal Medicine

## 2009-12-21 LAB — CONVERTED CEMR LAB

## 2009-12-28 ENCOUNTER — Encounter: Payer: Self-pay | Admitting: Internal Medicine

## 2010-01-01 ENCOUNTER — Encounter: Payer: Self-pay | Admitting: Internal Medicine

## 2010-01-01 ENCOUNTER — Emergency Department (HOSPITAL_COMMUNITY): Admission: EM | Admit: 2010-01-01 | Discharge: 2010-01-01 | Payer: Self-pay | Admitting: Emergency Medicine

## 2010-01-04 ENCOUNTER — Ambulatory Visit: Payer: Self-pay | Admitting: Internal Medicine

## 2010-01-04 DIAGNOSIS — B029 Zoster without complications: Secondary | ICD-10-CM | POA: Insufficient documentation

## 2010-01-21 ENCOUNTER — Telehealth: Payer: Self-pay | Admitting: Internal Medicine

## 2010-01-26 ENCOUNTER — Encounter (INDEPENDENT_AMBULATORY_CARE_PROVIDER_SITE_OTHER): Payer: Self-pay | Admitting: Cardiovascular Disease

## 2010-01-26 ENCOUNTER — Inpatient Hospital Stay (HOSPITAL_COMMUNITY): Admission: EM | Admit: 2010-01-26 | Discharge: 2010-02-02 | Payer: Self-pay | Admitting: Emergency Medicine

## 2010-01-26 ENCOUNTER — Ambulatory Visit: Payer: Self-pay | Admitting: Internal Medicine

## 2010-02-02 ENCOUNTER — Encounter (INDEPENDENT_AMBULATORY_CARE_PROVIDER_SITE_OTHER): Payer: Self-pay | Admitting: Internal Medicine

## 2010-02-08 ENCOUNTER — Ambulatory Visit: Payer: Self-pay | Admitting: Internal Medicine

## 2010-02-08 LAB — CONVERTED CEMR LAB: INR: 6.8

## 2010-02-15 ENCOUNTER — Ambulatory Visit: Payer: Self-pay | Admitting: Internal Medicine

## 2010-02-15 LAB — CONVERTED CEMR LAB

## 2010-02-22 ENCOUNTER — Ambulatory Visit: Payer: Self-pay | Admitting: Internal Medicine

## 2010-02-22 LAB — CONVERTED CEMR LAB: INR: 2.9

## 2010-03-15 ENCOUNTER — Ambulatory Visit: Payer: Self-pay | Admitting: Internal Medicine

## 2010-03-15 LAB — CONVERTED CEMR LAB

## 2010-03-16 ENCOUNTER — Inpatient Hospital Stay (HOSPITAL_COMMUNITY): Admission: AD | Admit: 2010-03-16 | Discharge: 2010-03-18 | Payer: Self-pay | Admitting: Cardiovascular Disease

## 2010-03-17 ENCOUNTER — Encounter (INDEPENDENT_AMBULATORY_CARE_PROVIDER_SITE_OTHER): Payer: Self-pay | Admitting: Cardiovascular Disease

## 2010-03-18 ENCOUNTER — Telehealth: Payer: Self-pay | Admitting: Internal Medicine

## 2010-03-22 ENCOUNTER — Ambulatory Visit: Payer: Self-pay | Admitting: Internal Medicine

## 2010-03-26 ENCOUNTER — Telehealth (INDEPENDENT_AMBULATORY_CARE_PROVIDER_SITE_OTHER): Payer: Self-pay | Admitting: *Deleted

## 2010-03-30 ENCOUNTER — Ambulatory Visit: Payer: Self-pay | Admitting: Internal Medicine

## 2010-03-30 LAB — CONVERTED CEMR LAB: INR: 2.1

## 2010-04-19 ENCOUNTER — Ambulatory Visit: Payer: Self-pay | Admitting: Internal Medicine

## 2010-04-19 LAB — CONVERTED CEMR LAB: INR: 2

## 2010-05-10 ENCOUNTER — Ambulatory Visit: Payer: Self-pay | Admitting: Internal Medicine

## 2010-05-10 LAB — CONVERTED CEMR LAB: INR: 2.6

## 2010-05-25 ENCOUNTER — Encounter: Payer: Self-pay | Admitting: Licensed Clinical Social Worker

## 2010-05-25 ENCOUNTER — Ambulatory Visit: Payer: Self-pay | Admitting: Internal Medicine

## 2010-05-25 ENCOUNTER — Telehealth: Payer: Self-pay | Admitting: Internal Medicine

## 2010-05-25 DIAGNOSIS — E559 Vitamin D deficiency, unspecified: Secondary | ICD-10-CM | POA: Insufficient documentation

## 2010-05-28 ENCOUNTER — Telehealth: Payer: Self-pay | Admitting: Internal Medicine

## 2010-05-28 DIAGNOSIS — E039 Hypothyroidism, unspecified: Secondary | ICD-10-CM | POA: Insufficient documentation

## 2010-05-28 LAB — CONVERTED CEMR LAB
ALT: 38 units/L — ABNORMAL HIGH (ref 0–35)
Albumin: 3.8 g/dL (ref 3.5–5.2)
Basophils Absolute: 0.1 10*3/uL (ref 0.0–0.1)
CO2: 20 meq/L (ref 19–32)
Chloride: 102 meq/L (ref 96–112)
Cholesterol: 183 mg/dL (ref 0–200)
Eosinophils Relative: 3 % (ref 0–5)
Free T4: 0.8 ng/dL (ref 0.80–1.80)
Glucose, Bld: 155 mg/dL — ABNORMAL HIGH (ref 70–99)
Lymphocytes Relative: 22 % (ref 12–46)
Lymphs Abs: 1.3 10*3/uL (ref 0.7–4.0)
Neutro Abs: 3.7 10*3/uL (ref 1.7–7.7)
Neutrophils Relative %: 63 % (ref 43–77)
Platelets: 263 10*3/uL (ref 150–400)
Potassium: 3.7 meq/L (ref 3.5–5.3)
RDW: 17.3 % — ABNORMAL HIGH (ref 11.5–15.5)
Sodium: 139 meq/L (ref 135–145)
TSH: 6.532 microintl units/mL — ABNORMAL HIGH (ref 0.350–4.5)
Total Bilirubin: 0.8 mg/dL (ref 0.3–1.2)
Total Protein: 6.9 g/dL (ref 6.0–8.3)
VLDL: 42 mg/dL — ABNORMAL HIGH (ref 0–40)
Vitamin B-12: 877 pg/mL (ref 211–911)
WBC: 5.8 10*3/uL (ref 4.0–10.5)

## 2010-05-31 ENCOUNTER — Telehealth: Payer: Self-pay | Admitting: *Deleted

## 2010-06-07 ENCOUNTER — Ambulatory Visit: Payer: Self-pay | Admitting: Internal Medicine

## 2010-06-07 LAB — CONVERTED CEMR LAB

## 2010-06-09 ENCOUNTER — Encounter: Admission: RE | Admit: 2010-06-09 | Discharge: 2010-06-09 | Payer: Self-pay | Admitting: Internal Medicine

## 2010-06-09 LAB — HM MAMMOGRAPHY: HM Mammogram: NEGATIVE

## 2010-06-11 ENCOUNTER — Telehealth: Payer: Self-pay | Admitting: Internal Medicine

## 2010-06-28 ENCOUNTER — Ambulatory Visit: Payer: Self-pay | Admitting: Internal Medicine

## 2010-07-12 ENCOUNTER — Telehealth: Payer: Self-pay | Admitting: Licensed Clinical Social Worker

## 2010-07-26 ENCOUNTER — Ambulatory Visit: Payer: Self-pay | Admitting: Internal Medicine

## 2010-07-26 LAB — CONVERTED CEMR LAB

## 2010-08-16 ENCOUNTER — Ambulatory Visit: Payer: Self-pay | Admitting: Internal Medicine

## 2010-08-16 LAB — CONVERTED CEMR LAB: INR: 1.2

## 2010-08-30 ENCOUNTER — Ambulatory Visit: Payer: Self-pay | Admitting: Internal Medicine

## 2010-08-30 LAB — CONVERTED CEMR LAB: INR: 2

## 2010-09-17 ENCOUNTER — Telehealth (INDEPENDENT_AMBULATORY_CARE_PROVIDER_SITE_OTHER): Payer: Self-pay | Admitting: *Deleted

## 2010-09-22 ENCOUNTER — Telehealth (INDEPENDENT_AMBULATORY_CARE_PROVIDER_SITE_OTHER): Payer: Self-pay | Admitting: *Deleted

## 2010-10-04 ENCOUNTER — Ambulatory Visit: Payer: Self-pay | Admitting: Internal Medicine

## 2010-10-04 LAB — CONVERTED CEMR LAB: INR: 2.9

## 2010-10-05 ENCOUNTER — Telehealth (INDEPENDENT_AMBULATORY_CARE_PROVIDER_SITE_OTHER): Payer: Self-pay | Admitting: Pharmacist

## 2010-11-01 ENCOUNTER — Ambulatory Visit: Payer: Self-pay

## 2010-11-08 ENCOUNTER — Ambulatory Visit
Admission: RE | Admit: 2010-11-08 | Discharge: 2010-11-08 | Payer: Self-pay | Source: Home / Self Care | Attending: Internal Medicine | Admitting: Internal Medicine

## 2010-11-21 DIAGNOSIS — I6789 Other cerebrovascular disease: Secondary | ICD-10-CM

## 2010-11-21 DIAGNOSIS — I4891 Unspecified atrial fibrillation: Secondary | ICD-10-CM

## 2010-11-21 DIAGNOSIS — Z7901 Long term (current) use of anticoagulants: Secondary | ICD-10-CM

## 2010-11-28 LAB — CONVERTED CEMR LAB
Albumin: 3.2 g/dL — ABNORMAL LOW (ref 3.5–5.2)
Alkaline Phosphatase: 92 units/L (ref 39–117)
BUN: 15 mg/dL (ref 6–23)
Creatinine, Ser: 1.51 mg/dL — ABNORMAL HIGH (ref 0.40–1.20)
Creatinine, Urine: 83.5 mg/dL
Microalb Creat Ratio: 13.9 mg/g (ref 0.0–30.0)
Sodium: 140 meq/L (ref 135–145)
TSH: 3.379 microintl units/mL (ref 0.350–5.50)
Total Protein: 7 g/dL (ref 6.0–8.3)

## 2010-11-29 ENCOUNTER — Emergency Department (HOSPITAL_COMMUNITY)
Admission: EM | Admit: 2010-11-29 | Discharge: 2010-11-29 | Payer: Self-pay | Source: Home / Self Care | Admitting: Emergency Medicine

## 2010-11-29 ENCOUNTER — Ambulatory Visit: Admit: 2010-11-29 | Payer: Self-pay

## 2010-11-30 NOTE — Assessment & Plan Note (Signed)
Summary: 261/CFB  Anticoagulant Therapy Managed by: [name]. [name]  PharmD CACP PCP: [name] [facility] Attending: Lowella Bandy MD Indication 1: Atrial fibrillation Indication 2: Encounter for therapeutic drug monitoring  V58.83 Start date: 09/15/2007  Patient Assessment Reviewed by: [name] PharmD  February 08, 2010 Medication review: verified warfarin dosage & schedule,verified previous prescription medications, verified doses & any changes, verified new medications, reviewed OTC medications, reviewed OTC health products-vitamins supplements etc Complications: dangerous INR (critical high or far low with high risk of thrombosis) Comments: HIGH INR today 6.8 (was greater than 10 on admission to hospital) Dietary changes: none   Health status changes: none   Lifestyle changes: none   Recent/future hospitalizations: yes  Details: YES. See details of Dr. Janalyn Harder discharge summary. Recent/future procedures: none   Recent/future dental: none Patient Assessment Part 2:  Have you MISSED ANY DOSES or CHANGED TABLETS?  No missed Warfarin doses or changed tablets.  Have you had any BRUISING or BLEEDING ( nose or gum bleeds,blood in urine or stool)?  No reported bruising or bleeding in nose, gums, urine, stool.  Have you STARTED or STOPPED any MEDICATIONS, including OTC meds,herbals or supplements?  No other medications or herbal supplements were started or stopped.  Have you CHANGED your DIET, especially green vegetables,or ALCOHOL intake?  No changes in diet or alcohol intake.  Have you had any ILLNESSES or HOSPITALIZATIONS?  YES. Hospitalized last week.  Have you had any signs of CLOTTING?(chest discomfort,dizziness,shortness of breath,arms tingling,slurred speech,swelling or redness in leg)    No chest discomfort, dizziness, shortness of breath, tingling in arm, slurred speech, swelling, or redness in leg.     Treatment  Target INR: 2.0-3.0 INR: 6.8  Date:  02/08/2010 Regimen In:  35.0mg /week INR reflects regimen in: 6.8  New  Tablet strength: : 5mg  Regimen Out:     Sunday: 1 Tablet     Monday: 1/2 Tablet     Tuesday: 1 Tablet     Wednesday: 1/2 Tablet     Thursday: 1/2 Tablet      Friday: 1/2 Tablet     Saturday: 1/2 Tablet Total Weekly: 22.5mg /wk mg  Next INR Due: 02/15/2010 Adjusted by: [name]. Calob Baskette [name] PharmD CACP   Return to anticoagulation clinic:  02/15/2010 Time of next visit: 1045  Hold:  2 Days    Comments: OMIT/HOLD x 2 days/doses; decrease 15+% to 22.5mg /wk, RTC in 1 week.  Allergies: No Known Drug Allergies

## 2010-11-30 NOTE — Assessment & Plan Note (Signed)
Summary: COU/CH  Anticoagulant Therapy Managed by: [name]. Janie Morning  PharmD CACP PCP: [name] [facility] Attending: Lowella Bandy MD Indication 1: Atrial fibrillation Indication 2: Encounter for therapeutic drug monitoring  V58.83 Start date: 09/15/2007  Patient Assessment Reviewed by: [name] PharmD  February 22, 2010 Medication review: verified warfarin dosage & schedule,verified previous prescription medications, verified doses & any changes, verified new medications, reviewed OTC medications, reviewed OTC health products-vitamins supplements etc Complications: none Dietary changes: none   Health status changes: none   Lifestyle changes: none   Recent/future hospitalizations: none   Recent/future procedures: none   Recent/future dental: none Patient Assessment Part 2:  Have you MISSED ANY DOSES or CHANGED TABLETS?  No missed Warfarin doses or changed tablets.  Have you had any BRUISING or BLEEDING ( nose or gum bleeds,blood in urine or stool)?  No reported bruising or bleeding in nose, gums, urine, stool.  Have you STARTED or STOPPED any MEDICATIONS, including OTC meds,herbals or supplements?  No other medications or herbal supplements were started or stopped.  Have you CHANGED your DIET, especially green vegetables,or ALCOHOL intake?  No changes in diet or alcohol intake.  Have you had any ILLNESSES or HOSPITALIZATIONS?  No reported illnesses or hospitalizations  Have you had any signs of CLOTTING?(chest discomfort,dizziness,shortness of breath,arms tingling,slurred speech,swelling or redness in leg)    No chest discomfort, dizziness, shortness of breath, tingling in arm, slurred speech, swelling, or redness in leg.     Treatment  Target INR: 2.0-3.0 INR: 2.9  Date: 02/22/2010 Regimen In:  22.5mg /wk INR reflects regimen in: 2.9  New  Tablet strength: : 5mg  Regimen Out:     Sunday: 1 Tablet     Monday: 1/2 Tablet     Tuesday: 1/2 Tablet     Wednesday: 1/2  Tablet     Thursday: 1/2 Tablet      Friday: 1/2 Tablet     Saturday: 1/2 Tablet Total Weekly: 20mg /wk mg  Next INR Due: 03/15/2010 Adjusted by: [name]. [name] PharmD CACP   Return to anticoagulation clinic:  03/15/2010 Time of next visit: 1100    Allergies: No Known Drug Allergies

## 2010-11-30 NOTE — Progress Notes (Signed)
Summary: med refill/gp  Phone Note Refill Request Message from:  Fax from Pharmacy on September 22, 2010 10:45 AM  Refills Requested: Medication #1:  WARFARIN SODIUM 5 MG TABS Take NO tablets on Wednesdays and Saturdays   Last Refilled: 08/23/2010 Last appt. 7/26; saw [name] 10/31.   Method Requested: Electronic Initial call taken by: Chinita Pester RN,  September 22, 2010 10:46 AM    Prescriptions: WARFARIN SODIUM 5 MG TABS (WARFARIN SODIUM) Take NO tablets on Wednesdays and Saturdays, and 1/2 tablet on all other days of the week  #12 x 6   Entered and Authorized by:   Zoila Shutter MD   Signed by:   Zoila Shutter MD on 09/22/2010   Method used:   Electronically to        [facility] (retail)       [address]       [place], [place]  [id number]       Ph: [phone]       Fax: [phone]   RxID:   [id number]

## 2010-11-30 NOTE — Consult Note (Signed)
Summary: [facility]   Imported By: Bradly Bienenstock 01/11/2010 16:32:50  _____________________________________________________________________  External Attachment:    Type:   Image     Comment:   External Document

## 2010-11-30 NOTE — Miscellaneous (Signed)
Summary: ED Visit  Patient came to [facility] ED with presentation of Herpes Zoster. Patient vital signs stable. Chest xray was normal. Creatinine was at baseline. INR was 1.77. Patient had been taking dexamathesone for gout which may have contributed to her development of Herpes Zoster. Will have patient take colchicine for gout, have her discontinue dexamethasone, start valacyclovir 1000mg  three times a day X 7 days, and give vicodin for pain. Will have patient follow up in clinic on monday or tuesday to reasses.  Patient Instructions: 1)  Please return to the clinic on Monday, January 04, 2010.  2)  Start taking Valacyclovir and Vicodin as directed. 3)  Page [phone] and punch in your telephone number if you would like to talk to a Doctor about any concerns during the weekend. 4)  Call clinic on monday to see at what time your appointment is.  5)  Stop taking dexamethasone.    Prescriptions: VICODIN 5-500 MG TABS (HYDROCODONE-ACETAMINOPHEN) Take 1 tablet by mouth every 6 hours as needed for pain  #20 x 0   Entered and Authorized by:   Laren Everts MD   Signed by:   Laren Everts MD on 01/01/2010   Method used:   Print then Give to Patient   RxID:   [id number] VALACYCLOVIR HCL 1 GM TABS (VALACYCLOVIR HCL) Take 1 tablet by mouth three times a day X 7 days  #21 x 0   Entered and Authorized by:   Laren Everts MD   Signed by:   Laren Everts MD on 01/01/2010   Method used:   Electronically to        [facility] (retail)       [address]       [place], [place]  [address]       Ph: [phone]       Fax: [phone]   RxID:   [id number]

## 2010-11-30 NOTE — Assessment & Plan Note (Signed)
Summary: COU/CH  Nurse Visit   Allergies: 1)  ! * Pain Pills "just About All of Them"

## 2010-11-30 NOTE — Assessment & Plan Note (Signed)
Summary: 1100 AM APPT PAGE JAY/CH  Anticoagulant Therapy Managed by: [name]. Janie Morning  PharmD CACP PCP: [name] [facility] Attending: Lowella Bandy MD Indication 1: Atrial fibrillation Indication 2: Encounter for therapeutic drug monitoring  V58.83 Start date: 09/15/2007  Patient Assessment Reviewed by: [name] PharmD  Mar 30, 2010 Medication review: verified warfarin dosage & schedule,verified previous prescription medications, verified doses & any changes, verified new medications, reviewed OTC medications, reviewed OTC health products-vitamins supplements etc Complications: none Dietary changes: none   Health status changes: none   Lifestyle changes: none   Recent/future hospitalizations: none   Recent/future procedures: none   Recent/future dental: none Patient Assessment Part 2:  Have you MISSED ANY DOSES or CHANGED TABLETS?  No missed Warfarin doses or changed tablets.  Have you had any BRUISING or BLEEDING ( nose or gum bleeds,blood in urine or stool)?  No reported bruising or bleeding in nose, gums, urine, stool.  Have you STARTED or STOPPED any MEDICATIONS, including OTC meds,herbals or supplements?  No other medications or herbal supplements were started or stopped.  Have you CHANGED your DIET, especially green vegetables,or ALCOHOL intake?  No changes in diet or alcohol intake.  Have you had any ILLNESSES or HOSPITALIZATIONS?  No reported illnesses or hospitalizations  Have you had any signs of CLOTTING?(chest discomfort,dizziness,shortness of breath,arms tingling,slurred speech,swelling or redness in leg)    No chest discomfort, dizziness, shortness of breath, tingling in arm, slurred speech, swelling, or redness in leg.     Treatment  Target INR: 2.0-3.0 INR: 2.1  Date: 03/30/2010 Regimen In:  17.5mg /wk INR reflects regimen in: 2.1  New  Tablet strength: : 5mg  Regimen Out:     Sunday: 1/2 Tablet     Monday: 1/2 Tablet     Tuesday: 1/2 Tablet     Wednesday: 1/2 Tablet     Thursday: 1/2 Tablet      Friday: 1/2 Tablet     Saturday: 0 Tablet Total Weekly: 15mg /wk mg  Next INR Due: 04/19/2010 Adjusted by: [name]. [name] PharmD CACP   Return to anticoagulation clinic:  04/19/2010 Time of next visit: 0945    Allergies: No Known Drug Allergies

## 2010-11-30 NOTE — Assessment & Plan Note (Signed)
Summary: PER DR VEGA VISIT FOR F/U HERPES FOSTER(BOGGALA)/CFB   Vital Signs:  Patient profile:   77 year old female Height:      64.25 inches (163.19 cm) Weight:      159.4 pounds (72.45 kg) BMI:     27.25 Temp:     96.6 degrees F (35.89 degrees C) oral Pulse rate:   74 / minute BP sitting:   123 / 80  (left arm)  Vitals Entered By: Kim Pester RN (January 04, 2010 2:39 PM) CC: ED f/u for shingles; rash on right side. Is Patient Diabetic? No Pain Assessment Patient in pain? no      Nutritional Status BMI of 25 - 29 = overweight  Have you ever been in a relationship where you felt threatened, hurt or afraid?No   Does patient need assistance? Functional Status Self care, Cook/clean, Shopping Ambulation Impaired:Risk for fall Comments uses a cane Family lives nearby.   Primary Care Provider:  [name]  CC:  ED f/u for shingles; rash on right side.Marland Kitchen  History of Present Illness: Kim [name] is a 77 yo F recently diagnosed on 1/4 with a herpes zoster infection who presents for follow-up. She was prescribed Valtrex at that time to be taken for 1 wk. She says the rash has already improved considerably; the vesicles are "drying" and they no longer hurt unless she accidentally opens them up. She has no other concerns.  Depression History:      The patient denies a depressed mood most of the day.         Preventive Screening-Counseling & Management  Alcohol-Tobacco     Alcohol drinks/day: 0     Smoking Status: quit     Year Quit: ABOUT 40+ YEARS A GO  Caffeine-Diet-Exercise     Caffeine use/day: 1 cup o f hot tea     Does Patient Exercise: no  Current Medications (verified): 1)  Probenecid 500 Mg Tabs (Probenecid) .... Take 1 Tablet By Mouth Two Times A Day 2)  Colchicine 0.6 Mg Tabs (Colchicine) .... Take 1 Tablet By Two Times A Day 3)  Metoprolol Tartrate 100 Mg Tabs (Metoprolol Tartrate) .... Take 1 and 1/2  Tablets By Mouth Two Times A Day 4)  Valium 5 Mg  Tabs (Diazepam) .... Take 1 Tablet By Mouth Two Times A Day As Needed 5)  Clonidine Hcl 0.2 Mg Tabs (Clonidine Hcl) .... Take 1 Tablet By Mouth Two Times A Day 6)  Tylenol  Tabs (Acetaminophen Tabs) .... Use As Directed As Needed 7)  Klor-Con M20 20 Meq Tbcr (Potassium Chloride Crys Cr) .... Take 2 Tablets By Mouth Two Times A Day 8)  Xalatan 0.005 % Soln (Latanoprost) .... Use As Directed 9)  Lasix 40 Mg Tabs (Furosemide) .... Take 2  Tablets By Mouth in The Morning and One Table in The Afternoon. 10)  Norvasc 10 Mg  Tabs (Amlodipine Besylate) .... Take 1 Tablet By Mouth Once A Day 11)  Nexium 40 Mg  Cpdr (Esomeprazole Magnesium) .... Take One By Mouth Daily 12)  Warfarin Sodium 5 Mg Tabs (Warfarin Sodium) .... Take 1 Tablet By Mouth Once A Day 13)  Zantac 75 75 Mg Tabs (Ranitidine Hcl) .... Prn 14)  Valacyclovir Hcl 1 Gm Tabs (Valacyclovir Hcl) .... Take 1 Tablet By Mouth Three Times A Day X 7 Days 15)  Vicodin 5-500 Mg Tabs (Hydrocodone-Acetaminophen) .... Take 1 Tablet By Mouth Every 6 Hours As Needed For Pain 16)  Zofran 4  Mg Tabs (Ondansetron Hcl) .Marland Kitchen.. 1 Tablet Every 4 Hours As Needed For Nausea  Allergies (verified): No Known Drug Allergies  Past History:  Past Medical History: Last updated: 01/05/2009 HTN A.fib on chronic coumadin therapy. Newly diagnosed Diastolic heart failure (Feb 8th,2010) Gout RA Mild chronic renal insuffiency Glaucoma s/p right cataract surgery  Past Surgical History: Last updated: 05/18/2009 none  Family History: Last updated: 05/18/2009 Father --CHD Mother --DM  Social History: Last updated: 05/18/2009 Has medicare. Lives alone at home.  Has 3 children in town which she is close to and is able to rely on if needed.  High school ducation. 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  Risk Factors: Alcohol Use: 0 (01/04/2010) Caffeine Use: 1 cup o f hot tea (01/04/2010) Exercise: no (01/04/2010)  Risk Factors: Smoking Status: quit  (01/04/2010)  Review of Systems      See HPI  Physical Exam  General:  Well-developed,well-nourished,in no acute distress; alert,appropriate and cooperative throughout examination Head:  Normocephalic and atraumatic without obvious abnormalities. No apparent alopecia or balding. Neurologic:  alert & oriented X3.   Skin:  erythematous rash present on RLQ of abdomen wrapping around to back and down to R side of groin and mons pubis. Vesicles appeared to have all dried save for a couple open spots. Psych:  Cognition and judgment appear intact. Alert and cooperative with normal attention span and concentration. No apparent delusions, illusions, hallucinations   Impression & Recommendations:  Problem # 1:  HERPES ZOSTER (ICD-053.9) Patient improved on Valtrex. Given the extensive nature of her rash, I will prescribe one more week of Valtrex for a total treatment duration of 2 wks. The Valtrex seems to make her nauseated, so I have also written a prescription for Zofran. She should call the clinic if her symptoms worsen.  Complete Medication List: 1)  Probenecid 500 Mg Tabs (Probenecid) .... Take 1 tablet by mouth two times a day 2)  Colchicine 0.6 Mg Tabs (Colchicine) .... Take 1 tablet by two times a day 3)  Metoprolol Tartrate 100 Mg Tabs (Metoprolol tartrate) .... Take 1 and 1/2  tablets by mouth two times a day 4)  Valium 5 Mg Tabs (Diazepam) .... Take 1 tablet by mouth two times a day as needed 5)  Clonidine Hcl 0.2 Mg Tabs (Clonidine hcl) .... Take 1 tablet by mouth two times a day 6)  Tylenol Tabs (Acetaminophen tabs) .... Use as directed as needed 7)  Klor-con M20 20 Meq Tbcr (Potassium chloride crys cr) .... Take 2 tablets by mouth two times a day 8)  Xalatan 0.005 % Soln (Latanoprost) .... Use as directed 9)  Lasix 40 Mg Tabs (Furosemide) .... Take 2  tablets by mouth in the morning and one table in the afternoon. 10)  Norvasc 10 Mg Tabs (Amlodipine besylate) .... Take 1 tablet by  mouth once a day 11)  Nexium 40 Mg Cpdr (Esomeprazole magnesium) .... Take one by mouth daily 12)  Warfarin Sodium 5 Mg Tabs (Warfarin sodium) .... Take 1 tablet by mouth once a day 13)  Zantac 75 75 Mg Tabs (Ranitidine hcl) .... Prn 14)  Valacyclovir Hcl 1 Gm Tabs (Valacyclovir hcl) .... Take 1 tablet by mouth three times a day x 7 days 15)  Vicodin 5-500 Mg Tabs (Hydrocodone-acetaminophen) .... Take 1 tablet by mouth every 6 hours as needed for pain 16)  Zofran 4 Mg Tabs (Ondansetron hcl) .Marland Kitchen.. 1 tablet every 4 hours as needed for nausea  Patient  Instructions: 1)  Please schedule a follow-up appointment with your primary doctor in 2-3 months. 2)  I have increased the time for you to be on the Valtrex (valacyclovir) from 7 days to 14 days. Please go to your pharmacy and pick up the additional week of antibiotics. 3)  I have also written an anti-nausea medication for you called Zofran. Please take this as directed for nausea. Prescriptions: ZOFRAN 4 MG TABS (ONDANSETRON HCL) 1 tablet every 4 hours as needed for nausea  #30 x 0   Entered and Authorized by:   Silvestre Gunner MD   Signed by:   Silvestre Gunner MD on 01/04/2010   Method used:   Electronically to        [facility] (retail)       [address]       [place], [place]  [address]       Ph: [phone]       Fax: [phone]   RxID:   [id number] VALACYCLOVIR HCL 1 GM TABS (VALACYCLOVIR HCL) Take 1 tablet by mouth three times a day X 7 days  #21 x 0   Entered and Authorized by:   Silvestre Gunner MD   Signed by:   Silvestre Gunner MD on 01/04/2010   Method used:   Electronically to        [facility] (retail)       [address]       [place], [place]  [id number]       Ph: [phone]       Fax: [phone]   RxID:   [id number]    Prevention & Chronic Care Immunizations   Influenza vaccine: Not documented   Influenza vaccine deferral: Refused  (04/20/2009)    Influenza vaccine due: 07/01/2009    Tetanus booster: Not documented   Td booster deferral: Refused  (04/20/2009)    Pneumococcal vaccine: Not documented   Pneumococcal vaccine deferral: Refused  (04/20/2009)    H. zoster vaccine: Not documented  Colorectal Screening   Hemoccult: Not documented   Hemoccult action/deferral: Refused  (04/20/2009)    Colonoscopy: Not documented   Colonoscopy action/deferral: Refused  (04/20/2009)  Other Screening   Pap smear: Not documented   Pap smear action/deferral: Refused  (04/20/2009)    Mammogram: Assessment: BIRADS 1.   (04/09/2008)   Mammogram action/deferral: Ordered  (04/20/2009)   Mammogram due: 03/2009    DXA bone density scan: Not documented   DXA bone density action/deferral: Not indicated  (04/20/2009)   Smoking status: quit  (01/04/2010)  Lipids   Total Cholesterol: 172  (04/06/2009)   LDL: 99  (04/06/2009)   LDL Direct: Not documented   HDL: 46  (04/06/2009)   Triglycerides: 134  (04/06/2009)    SGOT (AST): 35  (07/24/2008)   SGPT (ALT): 23  (07/24/2008)   Alkaline phosphatase: 83  (07/24/2008)   Total bilirubin: 1.1  (07/24/2008)  Hypertension   Last Blood Pressure: 123 / 80  (01/04/2010)   Serum creatinine: 1.62  (06/22/2009)   Serum potassium 4.1  (06/22/2009)  Self-Management Support :    Patient will work on the following items until the next clinic visit to reach self-care goals:     Medications and monitoring: take my medicines every day, bring all of my medications to every visit  (01/04/2010)     Eating: eat more vegetables, use fresh or frozen vegetables, eat foods that are low in salt  (01/04/2010)    Hypertension self-management  support: Animator, Resources for patients handout, Written self-care plan  (01/04/2010)   Hypertension self-care plan printed.   Hypertension education handout printed    Lipid self-management support: Education handout, Resources for patients handout, Written  self-care plan  (01/04/2010)   Lipid self-care plan printed.   Lipid education handout printed      Resource handout printed.  Appended Document: Orders Update    Clinical Lists Changes  Orders: Added new Service order of Est. Patient Level II (19147) - Signed

## 2010-11-30 NOTE — Assessment & Plan Note (Signed)
Summary: COU/CH  Anticoagulant Therapy Managed by: [name]. Janie Morning  PharmD CACP PCP: [name] [facility] Attending: Josem Kaufmann MD, Lawrence Indication 1: Atrial fibrillation Indication 2: Encounter for therapeutic drug monitoring  V58.83 Start date: 09/15/2007  Patient Assessment Reviewed by: [name] PharmD  February 15, 2010 Medication review: verified warfarin dosage & schedule,verified previous prescription medications, verified doses & any changes, verified new medications, reviewed OTC medications, reviewed OTC health products-vitamins supplements etc Complications: none Dietary changes: none   Health status changes: none   Lifestyle changes: none   Recent/future hospitalizations: none   Recent/future procedures: none   Recent/future dental: none Patient Assessment Part 2:  Have you MISSED ANY DOSES or CHANGED TABLETS?  YES. She has had her dose modified by Dr. Wylie Hail.  Have you had any BRUISING or BLEEDING ( nose or gum bleeds,blood in urine or stool)?  No reported bruising or bleeding in nose, gums, urine, stool.  Have you STARTED or STOPPED any MEDICATIONS, including OTC meds,herbals or supplements?  No other medications or herbal supplements were started or stopped.  Have you CHANGED your DIET, especially green vegetables,or ALCOHOL intake?  No changes in diet or alcohol intake.  Have you had any ILLNESSES or HOSPITALIZATIONS?  No reported illnesses or hospitalizations  Have you had any signs of CLOTTING?(chest discomfort,dizziness,shortness of breath,arms tingling,slurred speech,swelling or redness in leg)    No chest discomfort, dizziness, shortness of breath, tingling in arm, slurred speech, swelling, or redness in leg.     Treatment  Target INR: 2.0-3.0 INR: 2.0  Date: 02/15/2010 Regimen In:  22.5mg /wk INR reflects regimen in: 2.0  New  Tablet strength: : 5mg      Comments: After leaving my clinic last week (on 11-Ap-11) she saw Dr. Algie Coffer on  13-Apr-11 who documented INR = 4.8 after HOLDING/OMITTING the doses on 11-Apr-11 and on 12-Apr-11.  He had her OMIT last Thursdays dose (14-Apr-11) as well as Saturdays dose 16-Apr-11.  He checked an INR on Friday 15-Apr-11 and it was 2.1  I will restart her on the regimen I had proposed before his intervention. I appreciate his daily follow-up with her INR management.  Allergies: No Known Drug Allergies  Appended Document: COU/CH Will see her in RTC visit next Monday 25-Apr-11 at 1100h.

## 2010-11-30 NOTE — Progress Notes (Signed)
Summary: med refill/gp  Phone Note Refill Request Message from:  Fax from Pharmacy on December 18, 2009 11:07 AM  Refills Requested: Medication #1:  NORVASC 10 MG  TABS Take 1 tablet by mouth once a day   Last Refilled: 11/20/2009  Method Requested: Electronic Initial call taken by: Chinita Pester RN,  December 18, 2009 11:07 AM  Follow-up for Phone Call       Follow-up by: [name],  December 20, 2009 11:27 PM    Prescriptions: NORVASC 10 MG  TABS (AMLODIPINE BESYLATE) Take 1 tablet by mouth once a day  #31 x 5   Entered and Authorized by:   [name]   Signed by:   [name] on 12/20/2009   Method used:   Electronically to        [facility] (retail)       [address]       [place], [place]  [id number]       Ph: [phone]       Fax: [phone]   RxID:   [id number]

## 2010-11-30 NOTE — Assessment & Plan Note (Signed)
Summary: COU/CH  Anticoagulant Therapy Managed by: Kim [name]. Kim [name]  PharmD CACP PCP: Kim Reveal MD [facility] Attending: Darl [name], Kim [name] Indication 1: Atrial fibrillation Indication 2: Encounter for therapeutic drug monitoring  V58.83 Start date: 09/15/2007  Patient Assessment Reviewed by: Kim [name] PharmD  Mar 22, 2010 Medication review: verified warfarin dosage & schedule,verified previous prescription medications, verified doses & any changes, verified new medications, reviewed OTC medications, reviewed OTC health products-vitamins supplements etc Complications: none Dietary changes: none   Health status changes: none   Lifestyle changes: none   Recent/future hospitalizations: yes  Details: Hospitalized by Dr. Mora [name]. See note (phone). Recent/future procedures: none   Recent/future dental: none Patient Assessment Part 2:  Have you MISSED ANY DOSES or CHANGED TABLETS?  YES. She has been OFF of warfarin for 8 days as of today for INRs running in 5-6 range while hospitalized. Day of discharge INR was approximately 4+ as per phone discussion today with Dr. Mora [name].  Have you had any BRUISING or BLEEDING ( nose or gum bleeds,blood in urine or stool)?  No reported bruising or bleeding in nose, gums, urine, stool.  Have you STARTED or STOPPED any MEDICATIONS, including OTC meds,herbals or supplements?  No other medications or herbal supplements were started or stopped.  Have you CHANGED your DIET, especially green vegetables,or ALCOHOL intake?  No changes in diet or alcohol intake.  Have you had any ILLNESSES or HOSPITALIZATIONS?  YES. See.  Have you had any signs of CLOTTING?(chest discomfort,dizziness,shortness of breath,arms tingling,slurred speech,swelling or redness in leg)    No chest discomfort, dizziness, shortness of breath, tingling in arm, slurred speech, swelling, or redness in leg.     Treatment  Target INR: 2.0-3.0 INR: 2.0  Date: 03/22/2010 Regimen In:    15mg /wk INR reflects regimen in: 2.0  New  Tablet strength: : 5mg  Regimen Out:     Sunday: 1/2 Tablet     Monday: 1/2 Tablet     Tuesday: 1/2 Tablet     Wednesday: 1/2 Tablet     Thursday: 1/2 Tablet      Friday: 1/2 Tablet     Saturday: 1/2 Tablet Total Weekly: 17.5mg /wk mg  Next INR Due: 03/30/2010 Adjusted by: Kim [name]. Kim [name] PharmD CACP   Return to anticoagulation clinic:  03/30/2010 Time of next visit: 1100   Comments: The INR TODAY (2.0) reflects 8 (EIGHT) days OFF of warfarin. She is very sensitive to warfarin's effect of recent (April hospitalization with documented INR > 10) and this most recent hospitalization the same observations. Discussed with Dr. Mora [name] by phone just now. He indicates with Afib and Pulm HTN--that we must/should continue warfarin even in the setting of marked hypoprothrombinemic responsiveness and will have to dose down for her at this time. I have reviewed her previous dosing history--and given that, will give 17.5mg /wk and follow up in 8 days (clinic is closed next Monday-[facility]). Will see in 8 days.   Allergies: No Known Drug Allergies  Appended Document: COU/CH Have given additional thought and review to her dosing history--and elect to adjust her dose DOWNWAR by giving 1/2 x 5mg  (2.5mg ) on 5 days of week and 0 (ZERO)mg on two days of week (Wed/Sat) = 12.5mg /wk and follow up on Mar 30, 2010 at 1100h.

## 2010-11-30 NOTE — Assessment & Plan Note (Signed)
Summary: COU/CH  Anticoagulant Therapy Managed by: Kim [name]. Kim [name]  [name] CACP [name]: Kim [name], Kim [name]: Atrial fibrillation Indication 2: Encounter for therapeutic drug monitoring  V58.83 Start date: 09/15/2007  Patient Assessment Reviewed by: Kim [name]  Kim [name], Kim [name]: verified warfarin dosage & schedule,verified previous prescription medications, verified doses & any changes, verified new medications, reviewed OTC medications, reviewed OTC health products-vitamins supplements etc Complications: none Dietary changes: none   Health status changes: none   Lifestyle changes: none   Recent/future hospitalizations: none   Recent/future procedures: none   Recent/future dental: none Patient Assessment Part 2:  Have you MISSED ANY DOSES or CHANGED TABLETS?  No missed Warfarin doses or changed tablets.  Have you had any BRUISING or BLEEDING ( nose or gum bleeds,blood in urine or stool)?  No reported bruising or bleeding in nose, gums, urine, stool.  Have you STARTED or STOPPED any MEDICATIONS, including OTC meds,herbals or supplements?  No other medications or herbal supplements were started or stopped.  Have you CHANGED your DIET, especially green vegetables,or ALCOHOL intake?  No changes in diet or alcohol intake.  Have you had any ILLNESSES or HOSPITALIZATIONS?  No reported illnesses or hospitalizations  Have you had any signs of CLOTTING?(chest discomfort,dizziness,shortness of breath,arms tingling,slurred speech,swelling or redness in leg)    No chest discomfort, dizziness, shortness of breath, tingling in arm, slurred speech, swelling, or redness in leg.     Treatment  Target INR: 2.0-3.0 INR: 2.0  Date: 10/[name]/Kim [name] Regimen In:  20.0mg /week INR reflects regimen in: 2.0  New  Tablet strength: : 5mg  Regimen Out:     Sunday: [name]/2 Tablet     Monday: [name] Tablet     Tuesday: [name]/2 Tablet  Wednesday: [name]/2 Tablet     Thursday: [name] Tablet      Friday: [name]/2 Tablet     Saturday: [name]/2 Tablet Total Weekly: 22.5mg /week mg  Next INR Due: 11/21/Kim [name] Adjusted by: Kim [name]. [name] [name] CACP   Return to anticoagulation clinic:  11/21/Kim [name] Time of next visit: 1100    Allergies: [name])  ! * Pain Pills "just About All of Them"

## 2010-11-30 NOTE — Progress Notes (Signed)
  Phone Note From Other Clinic   Caller: Provider Reason for Call: Schedule Patient Appt, Medication Check Request: Talk with Provider, Call Patient Action Taken: Phone Call Completed, Provider Notified Details for Reason: Seen by Dr. Coralee Pesa yesterday in my absence. INR = 2.9 on 22.5mg /week warfarin. Details of Complaint: Denies any signs or symptoms of bleeding. Details of Action Taken: Left upon same regimen. RTC in 1 month. Summary of Call: Seen by Dr. Coralee Pesa. Initial call taken by: [name] PharmD

## 2010-11-30 NOTE — Assessment & Plan Note (Signed)
Summary: COU/CH  Anticoagulant Therapy Managed by: [name]. Janie Morning  PharmD CACP PCP: [name] [facility] Attending: Darl Pikes, [name] Indication 1: Atrial fibrillation Indication 2: Encounter for therapeutic drug monitoring  V58.83 Start date: 09/15/2007  Patient Assessment Reviewed by: [name] PharmD  Mar 15, 2010 Medication review: verified warfarin dosage & schedule,verified previous prescription medications, verified doses & any changes, verified new medications, reviewed OTC medications, reviewed OTC health products-vitamins supplements etc Complications: none Dietary changes: none   Health status changes: none   Lifestyle changes: none   Recent/future hospitalizations: none   Recent/future procedures: none   Recent/future dental: none Patient Assessment Part 2:  Have you MISSED ANY DOSES or CHANGED TABLETS?  No missed Warfarin doses or changed tablets.  Have you had any BRUISING or BLEEDING ( nose or gum bleeds,blood in urine or stool)?  No reported bruising or bleeding in nose, gums, urine, stool.  Have you STARTED or STOPPED any MEDICATIONS, including OTC meds,herbals or supplements?  No other medications or herbal supplements were started or stopped.  Have you CHANGED your DIET, especially green vegetables,or ALCOHOL intake?  No changes in diet or alcohol intake.  Have you had any ILLNESSES or HOSPITALIZATIONS?  No reported illnesses or hospitalizations  Have you had any signs of CLOTTING?(chest discomfort,dizziness,shortness of breath,arms tingling,slurred speech,swelling or redness in leg)    No chest discomfort, dizziness, shortness of breath, tingling in arm, slurred speech, swelling, or redness in leg.     Treatment  Target INR: 2.0-3.0 INR: 6.4  Date: 03/15/2010 Regimen In:  20mg /wk INR reflects regimen in: 6.4  New  Tablet strength: : 5mg  Regimen Out:     Sunday: 1/2 Tablet     Monday: 0 Tablet     Tuesday: 0 Tablet     Wednesday: 1/2  Tablet     Thursday: 0 Tablet      Friday: 1/2 Tablet     Saturday: 1/2 Tablet Total Weekly: 15mg /wk mg  Next INR Due: 03/22/2010 Adjusted by: [name]. [name] PharmD CACP   Return to anticoagulation clinic:  03/22/2010 Time of next visit: 1130  Hold:  1 Days     Allergies: No Known Drug Allergies

## 2010-11-30 NOTE — Assessment & Plan Note (Signed)
Summary: COU/APPT 9:30AM/VS  Anticoagulant Therapy Managed by: [name]. Janie Morning  PharmD CACP PCP: [name] [facility] Attending: Josem Kaufmann MD, Lawrence Indication 1: Atrial fibrillation Indication 2: Encounter for therapeutic drug monitoring  V58.83 Start date: 09/15/2007  Patient Assessment Reviewed by: [name] PharmD  December 21, 2009 Medication review: verified warfarin dosage & schedule,verified previous prescription medications, verified doses & any changes, verified new medications, reviewed OTC medications, reviewed OTC health products-vitamins supplements etc Complications: none Dietary changes: none   Health status changes: none   Lifestyle changes: none   Recent/future hospitalizations: none   Recent/future procedures: none   Recent/future dental: none Patient Assessment Part 2:  Have you MISSED ANY DOSES or CHANGED TABLETS?  No missed Warfarin doses or changed tablets.  Have you had any BRUISING or BLEEDING ( nose or gum bleeds,blood in urine or stool)?  No reported bruising or bleeding in nose, gums, urine, stool.  Have you STARTED or STOPPED any MEDICATIONS, including OTC meds,herbals or supplements?  No other medications or herbal supplements were started or stopped.  Have you CHANGED your DIET, especially green vegetables,or ALCOHOL intake?  No changes in diet or alcohol intake.  Have you had any ILLNESSES or HOSPITALIZATIONS?  No reported illnesses or hospitalizations  Have you had any signs of CLOTTING?(chest discomfort,dizziness,shortness of breath,arms tingling,slurred speech,swelling or redness in leg)    No chest discomfort, dizziness, shortness of breath, tingling in arm, slurred speech, swelling, or redness in leg.     Treatment  Target INR: 2.0-3.0 INR: 2.7  Date: 12/21/2009 Regimen In:  35.0mg /week INR reflects regimen in: 2.7  New  Tablet strength: : 5mg  Regimen Out:     Sunday: 1 Tablet     Monday: 1 Tablet     Tuesday: 1 Tablet  Wednesday: 1 Tablet     Thursday: 1 Tablet      Friday: 1 Tablet     Saturday: 1 Tablet Total Weekly: 35.0mg /week mg  Next INR Due: 01/18/2010 Adjusted by: [name]. [name] PharmD CACP   Return to anticoagulation clinic:  01/18/2010 Time of next visit: 1000    Allergies: No Known Drug Allergies

## 2010-11-30 NOTE — Assessment & Plan Note (Signed)
Summary: COU/CH  Anticoagulant Therapy Managed by: [name]. Kim [name]  PharmD CACP PCP: [name] [facility] Attending: Blanch Media MD Indication 1: Atrial fibrillation Indication 2: Encounter for therapeutic drug monitoring  V58.83 Start date: 09/15/2007  Patient Assessment Reviewed by: [name] PharmD  August 16, 2010 Medication review: verified warfarin dosage & schedule,verified previous prescription medications, verified doses & any changes, verified new medications, reviewed OTC medications, reviewed OTC health products-vitamins supplements etc Complications: none Dietary changes: none   Health status changes: none   Lifestyle changes: none   Recent/future hospitalizations: none   Recent/future procedures: none   Recent/future dental: none Patient Assessment Part 2:  Have you MISSED ANY DOSES or CHANGED TABLETS?  No missed Warfarin doses or changed tablets.  Have you had any BRUISING or BLEEDING ( nose or gum bleeds,blood in urine or stool)?  No reported bruising or bleeding in nose, gums, urine, stool.  Have you STARTED or STOPPED any MEDICATIONS, including OTC meds,herbals or supplements?  No other medications or herbal supplements were started or stopped.  Have you CHANGED your DIET, especially green vegetables,or ALCOHOL intake?  No changes in diet or alcohol intake.  Have you had any ILLNESSES or HOSPITALIZATIONS?  No reported illnesses or hospitalizations  Have you had any signs of CLOTTING?(chest discomfort,dizziness,shortness of breath,arms tingling,slurred speech,swelling or redness in leg)    No chest discomfort, dizziness, shortness of breath, tingling in arm, slurred speech, swelling, or redness in leg.     Treatment  Target INR: 2.0-3.0 INR: 1.2  Date: 08/16/2010 Regimen In:  20mg /wk INR reflects regimen in: 1.2  New  Tablet strength: : 5mg  Regimen Out:     Sunday: 1/2 Tablet     Monday: 1 Tablet     Tuesday: 1/2 Tablet     Wednesday:  1/2 Tablet     Thursday: 1/2 Tablet      Friday: 1/2 Tablet     Saturday: 1/2 Tablet Total Weekly: 20.0mg /week mg  Next INR Due: 08/30/2010 Adjusted by: [name]. Kim [name] PharmD CACP   Return to anticoagulation clinic:  08/30/2010 Time of next visit: 1115   Comments: Patient has inquired about Pradaxa. I had a rather long-informed discussion. I indicated she should have a similar discussion mid-November when she sees her PCP/Resident Physician for her scheduled appointment. We have indeed had difficulty (documented) of establishing a therapeutic INR response for her.  She however has (she states) "GERD" for which she is on a PPI. This could mitigate AGAINST the potential for dyspepsis--seen with dabigatran. We can discuss further.  Allergies: 1)  ! * Pain Pills "just About All of Them"

## 2010-11-30 NOTE — Assessment & Plan Note (Signed)
Summary: COU/CH  Anticoagulant Therapy Managed by: [name]. Janie Morning  PharmD CACP PCP: [name] [facility] Attending: Josem Kaufmann MD, Lawrence Indication 1: Atrial fibrillation Indication 2: Encounter for therapeutic drug monitoring  V58.83 Start date: 09/15/2007  Patient Assessment Reviewed by: [name] PharmD  June 07, 2010 Medication review: verified warfarin dosage & schedule,verified previous prescription medications, verified doses & any changes, verified new medications, reviewed OTC medications, reviewed OTC health products-vitamins supplements etc Complications: none Dietary changes: none   Health status changes: none   Lifestyle changes: none   Recent/future hospitalizations: none   Recent/future procedures: none   Recent/future dental: none Patient Assessment Part 2:  Have you MISSED ANY DOSES or CHANGED TABLETS?  No missed Warfarin doses or changed tablets.  Have you had any BRUISING or BLEEDING ( nose or gum bleeds,blood in urine or stool)?  No reported bruising or bleeding in nose, gums, urine, stool.  Have you STARTED or STOPPED any MEDICATIONS, including OTC meds,herbals or supplements?  No other medications or herbal supplements were started or stopped.  Have you CHANGED your DIET, especially green vegetables,or ALCOHOL intake?  No changes in diet or alcohol intake.  Have you had any ILLNESSES or HOSPITALIZATIONS?  No reported illnesses or hospitalizations  Have you had any signs of CLOTTING?(chest discomfort,dizziness,shortness of breath,arms tingling,slurred speech,swelling or redness in leg)    No chest discomfort, dizziness, shortness of breath, tingling in arm, slurred speech, swelling, or redness in leg.     Treatment  Target INR: 2.0-3.0 INR: 1.8  Date: 06/07/2010 Regimen In:  17.5mg /week INR reflects regimen in: 1.8  New  Tablet strength: : 5mg  Regimen Out:     Sunday: 1/2 Tablet     Monday: 1 Tablet     Tuesday: 1/2 Tablet     Wednesday:  1 Tablet     Thursday: 1/2 Tablet      Friday: 1 Tablet     Saturday: 1/2 Tablet Total Weekly: 22.5mg /wk mg  Next INR Due: 06/21/2010 Adjusted by: [name]. [name] PharmD CACP   Return to anticoagulation clinic:  06/21/2010 Time of next visit: 1045    Allergies: 1)  ! * Pain Pills "just About All of Them"

## 2010-11-30 NOTE — Progress Notes (Signed)
Summary: Refill/gh  Phone Note Refill Request Message from:  Fax from Pharmacy on May 28, 2010 4:03 PM  Refills Requested: Medication #1:  VALIUM 5 MG TABS Take 1 tablet by mouth two times a day as needed   Dosage confirmed as above?Dosage Confirmed   Last Refilled: 10/19/2009  Method Requested: Pick up at Office Initial call taken by: Angelina Ok RN,  May 28, 2010 4:04 PM  Follow-up for Phone Call        Refill approved-nurse to complete Follow-up by: [name],  May 28, 2010 7:29 PM  Additional Follow-up for Phone Call Additional follow up Details #1::        Rx faxed to pharmacy Additional Follow-up by: Angelina Ok RN,  May 31, 2010 12:06 PM

## 2010-11-30 NOTE — Progress Notes (Signed)
Summary: refill/ hla  Phone Note Refill Request Message from:  Fax from Pharmacy on November 20, 2009 6:07 PM  Refills Requested: Medication #1:  METOPROLOL TARTRATE 100 MG TABS Take 1 and 1/2  tablets by mouth two times a day   Last Refilled: 12/20  Medication #2:  KLOR-CON M20 20 MEQ TBCR Take 2 tablets by mouth two times a day   Last Refilled: 11/22 Initial call taken by: Marin Roberts RN,  November 20, 2009 6:07 PM  Follow-up for Phone Call       Follow-up by: [name],  November 22, 2009 7:33 PM    Prescriptions: KLOR-CON M20 20 MEQ TBCR (POTASSIUM CHLORIDE CRYS CR) Take 2 tablets by mouth two times a day  #120 x 3   Entered and Authorized by:   [name]   Signed by:   [name] on 11/22/2009   Method used:   Electronically to        [facility] (retail)       [address]       [place], [place]  [address]       Ph: [phone]       Fax: [phone]   RxID:   [id number] METOPROLOL TARTRATE 100 MG TABS (METOPROLOL TARTRATE) Take 1 and 1/2  tablets by mouth two times a day  #90 x 5   Entered and Authorized by:   [name]   Signed by:   [name] on 11/22/2009   Method used:   Electronically to        [facility] (retail)       [address]       [place], [place]  [id number]       Ph: [phone]       Fax: [phone]   RxID:   [id number]

## 2010-11-30 NOTE — Assessment & Plan Note (Signed)
Summary: COU/CH  Anticoagulant Therapy Managed by: [name]. Kim [name]  PharmD CACP PCP: [name] [facility] Attending: [name] Indication 1: Atrial fibrillation Indication 2: Encounter for therapeutic drug monitoring  V58.83 Start date: 09/15/2007  Patient Assessment Reviewed by: [name] PharmD  May 10, 2010 Medication review: verified warfarin dosage & schedule,verified previous prescription medications, verified doses & any changes, verified new medications, reviewed OTC medications, reviewed OTC health products-vitamins supplements etc Complications: none Dietary changes: none   Health status changes: none   Lifestyle changes: none   Recent/future hospitalizations: none   Recent/future procedures: none   Recent/future dental: none Patient Assessment Part 2:  Have you MISSED ANY DOSES or CHANGED TABLETS?  No missed Warfarin doses or changed tablets.  Have you had any BRUISING or BLEEDING ( nose or gum bleeds,blood in urine or stool)?  No reported bruising or bleeding in nose, gums, urine, stool.  Have you STARTED or STOPPED any MEDICATIONS, including OTC meds,herbals or supplements?  No other medications or herbal supplements were started or stopped.  Have you CHANGED your DIET, especially green vegetables,or ALCOHOL intake?  No changes in diet or alcohol intake.  Have you had any ILLNESSES or HOSPITALIZATIONS?  No reported illnesses or hospitalizations  Have you had any signs of CLOTTING?(chest discomfort,dizziness,shortness of breath,arms tingling,slurred speech,swelling or redness in leg)    No chest discomfort, dizziness, shortness of breath, tingling in arm, slurred speech, swelling, or redness in leg.     Treatment  Target INR: 2.0-3.0 INR: 2.6  Date: 05/10/2010 Regimen In:  20mg /wk INR reflects regimen in: 2.6  New  Tablet strength: : 5mg  Regimen Out:     Sunday: 0 Tablet     Monday: 1 Tablet     Tuesday: 1/2 Tablet     Wednesday: 1 Tablet     Thursday: 0 Tablet      Friday: 1 Tablet     Saturday: 0 Tablet Total Weekly: 17.5mg /week mg  Next INR Due: 06/07/2010 Adjusted by: [name]. [name] PharmD CACP   Return to anticoagulation clinic:  06/07/2010 Time of next visit: 1045    Allergies: No Known Drug Allergies

## 2010-11-30 NOTE — Assessment & Plan Note (Signed)
Summary: COU/CH  Anticoagulant Therapy Managed by: [name]. Kim [name]  PharmD CACP PCP: [name] [facility] AttendingRogelia Boga MD, Elizabeth Indication 1: Atrial fibrillation Indication 2: Encounter for therapeutic drug monitoring  V58.83 Start date: 09/15/2007  Patient Assessment Reviewed by: [name] PharmD  April 19, 2010 Medication review: verified warfarin dosage & schedule,verified previous prescription medications, verified doses & any changes, verified new medications, reviewed OTC medications, reviewed OTC health products-vitamins supplements etc Complications: none Dietary changes: none   Health status changes: none   Lifestyle changes: none   Recent/future hospitalizations: none   Recent/future procedures: none   Recent/future dental: none Patient Assessment Part 2:  Have you MISSED ANY DOSES or CHANGED TABLETS?  No missed Warfarin doses or changed tablets.  Have you had any BRUISING or BLEEDING ( nose or gum bleeds,blood in urine or stool)?  No reported bruising or bleeding in nose, gums, urine, stool.  Have you STARTED or STOPPED any MEDICATIONS, including OTC meds,herbals or supplements?  No other medications or herbal supplements were started or stopped.  Have you CHANGED your DIET, especially green vegetables,or ALCOHOL intake?  No changes in diet or alcohol intake.  Have you had any ILLNESSES or HOSPITALIZATIONS?  No reported illnesses or hospitalizations  Have you had any signs of CLOTTING?(chest discomfort,dizziness,shortness of breath,arms tingling,slurred speech,swelling or redness in leg)    No chest discomfort, dizziness, shortness of breath, tingling in arm, slurred speech, swelling, or redness in leg.     Treatment  Target INR: 2.0-3.0 INR: 2.0  Date: 04/19/2010 Regimen In:  15mg /wk INR reflects regimen in: 2.0  New  Tablet strength: : 5mg  Regimen Out:     Sunday: 1/2 Tablet     Monday: 1 Tablet     Tuesday: 1/2 Tablet     Wednesday:  1/2 Tablet     Thursday: 1/2 Tablet      Friday: 1/2 Tablet     Saturday: 1/2 Tablet Total Weekly: 20mg /wk mg  Next INR Due: 05/10/2010 Adjusted by: [name]. [name] PharmD CACP   Return to anticoagulation clinic:  05/10/2010 Time of next visit: 1045    Allergies: No Known Drug Allergies

## 2010-11-30 NOTE — Assessment & Plan Note (Signed)
Summary: Soc. Work   Social Work Evaluation Date  05/25/2010 Patient name Kim [name]  Primary MD   : [name] Social Worker's name : Dorothe Pea MSW- LCSW  Home [phone]    Cell phone: .  Marland Kitchen     Alternate phone: . Marland Kitchen       Individual making referral: Dr. Gwenlyn Perking  Primary Reason for Referral:     Other Comments PACE assessment.  Patient is Medicare only and will not qualify for PCS aide.     Multiple medical and MH issues including CHF, RA,weight loss, htn, anxiety.  Hx of stroke.   Low social supports except for 24 yo granddaughter.  Action taken by Social Work: Met with patient in exam room and find a rather frail looking elderly woman who is here with her granddaughter Selena Batten.  Kim lives with patient but is in and out for work and will be attending [employer] in the fall.  Patient admits to only being able to stand for 5 minutes or so.  She no longer does her shopping or driving.  She has a walker with a seat and uses this in the home.  She denies that she cannot bathe or dress herself but likely needs assist with those ADL's.  Expresses need for assist with home tasks and meal prep.  Discussed the PACE program with patient and granddaughter and they are receptive to our referring to the program for home visit.  SW will refer to PACE and f/u with family.   Home phone is [phone] and Kim's cell is [phone].

## 2010-11-30 NOTE — Assessment & Plan Note (Signed)
Summary: Orders Update/INR  Clinical Lists Changes  Problems: Assessed ENCOUNTER FOR THERAPEUTIC DRUG MONITORING as comment only - INR 2.9. I have told patient to stay on her current regimen of medication and to follow up with Dr. Alexandria Lodge in 1 month.  Orders: T-Protime (in-house) [phone])  Orders: Added new Test order of T-Protime (in-house) [phone]) - Signed Observations: Added new observation of PEADULT: Kim Shutter MD ~General`Gen appear (10/04/2010 10:39) Added new observation of GEN APPEAR: alert and well-developed.  She is in NAD. (10/04/2010 10:39) Added new observation of NKA: F (10/04/2010 10:39) Added new observation of ALLERGY REV: Done (10/04/2010 10:39) Added new observation of MEDRECON: current updated (10/04/2010 10:39) Added new observation of HPI: 77 yr old who comes in to get her PT/INR checked. She thought that Dr. Alexandria Lodge was here today. She has not had any problems with bleeding. She has been taking her medicine.   (10/04/2010 10:39) Added new observation of PRIMARY MD: Kim Reveal MD (10/04/2010 10:39)      Impression & Recommendations:  Problem # 1:  ENCOUNTER FOR THERAPEUTIC DRUG MONITORING (ICD-V58.83) INR 2.9. I have told patient to stay on her current regimen of medication and to follow up with Dr. Alexandria Lodge in 1 month.  Orders: T-Protime (in-house) ([id number])  Complete Medication List: 1)  Probenecid 500 Mg Tabs (Probenecid) .... Take 1 tablet by mouth two times a day 2)  Colchicine 0.6 Mg Tabs (Colchicine) .... Take 1 tablet by two times a day 3)  Metoprolol Tartrate 100 Mg Tabs (Metoprolol tartrate) .... Take 1  tablet by mouth two times a day 4)  Valium 5 Mg Tabs (Diazepam) .... Take 1 tablet by mouth two times a day as needed 5)  Clonidine Hcl 0.2 Mg Tabs (Clonidine hcl) .... Take 1 tablet by mouth two times a day 6)  Tylenol Tabs (Acetaminophen tabs) .... Use as directed as needed 7)  Klor-con M20 20 Meq Tbcr (Potassium chloride crys cr) ....  Take 2 tablets by mouth two times a day 8)  Xalatan 0.005 % Soln (Latanoprost) .... Use as directed 9)  Lasix 40 Mg Tabs (Furosemide) .... Take 1 tablet by mouth in the morning and 1 tablet in the afternoon. 10)  Warfarin Sodium 5 Mg Tabs (Warfarin sodium) .... Take no tablets on wednesdays and saturdays, and 1/2 tablet on all other days of the week 11)  Zantac 150 Mg Tabs (Ranitidine hcl) .... Take 1 tablet by mouth once a day 12)  Cardizem 60 Mg Tabs (Diltiazem hcl) .... Take 1 tab by mouth every six hours 13)  Synthroid 88 Mcg Tabs (Levothyroxine sodium) .... Take 1 tablet by mouth once a day.(take medication in empty stomach and apart from other drugs)   Primary Care Provider:  [name]   History of Present Illness: 77 yr old who comes in to get her PT/INR checked. She thought that Dr. Alexandria Lodge was here today. She has not had any problems with bleeding. She has been taking her medicine.     Current Medications (verified): 1)  Probenecid 500 Mg Tabs (Probenecid) .... Take 1 Tablet By Mouth Two Times A Day 2)  Colchicine 0.6 Mg Tabs (Colchicine) .... Take 1 Tablet By Two Times A Day 3)  Metoprolol Tartrate 100 Mg Tabs (Metoprolol Tartrate) .... Take 1  Tablet By Mouth Two Times A Day 4)  Valium 5 Mg Tabs (Diazepam) .... Take 1 Tablet By Mouth Two Times A Day As Needed 5)  Clonidine Hcl 0.2 Mg  Tabs (Clonidine Hcl) .... Take 1 Tablet By Mouth Two Times A Day 6)  Tylenol  Tabs (Acetaminophen Tabs) .... Use As Directed As Needed 7)  Klor-Con M20 20 Meq Tbcr (Potassium Chloride Crys Cr) .... Take 2 Tablets By Mouth Two Times A Day 8)  Xalatan 0.005 % Soln (Latanoprost) .... Use As Directed 9)  Lasix 40 Mg Tabs (Furosemide) .... Take 1 Tablet By Mouth in The Morning and 1 Tablet in The Afternoon. 10)  Warfarin Sodium 5 Mg Tabs (Warfarin Sodium) .... Take No Tablets On Wednesdays and Saturdays, and 1/2 Tablet On All Other Days of The Week 11)  Zantac 150 Mg Tabs (Ranitidine Hcl)  .... Take 1 Tablet By Mouth Once A Day 12)  Cardizem 60 Mg Tabs (Diltiazem Hcl) .... Take 1 Tab By Mouth Every Six Hours 13)  Synthroid 88 Mcg Tabs (Levothyroxine Sodium) .... Take 1 Tablet By Mouth Once A Day.(Take Medication in Empty Stomach and Apart From Other Drugs)  Allergies (verified): 1)  ! * Pain Pills "just About All of Them"   Physical Exam  General:  alert and well-developed.  She is in NAD.

## 2010-11-30 NOTE — Progress Notes (Signed)
Summary: med refill/gp  Phone Note Refill Request Message from:  Patient on June 11, 2010 11:49 AM  Refills Requested: Medication #1:  KLOR-CON M20 20 MEQ TBCR Take 2 tablets by mouth two times a day   Dosage confirmed as above?Dosage Confirmed   Supply Requested: 6 months Pt. states she called x 5 days and has beenout x 5 days.   Method Requested: Electronic Initial call taken by: Chinita Pester RN,  June 11, 2010 11:49 AM  Follow-up for Phone Call        Please schedule follow-up with Dr. [name] in late October.  Thank You. Follow-up by: Doneen Poisson MD,  June 11, 2010 4:27 PM  Additional Follow-up for Phone Call Additional follow up Details #1::        Flag sent to [facility] for an appt. Additional Follow-up by: Chinita Pester RN,  June 11, 2010 5:07 PM    Prescriptions: KLOR-CON M20 20 MEQ TBCR (POTASSIUM CHLORIDE CRYS CR) Take 2 tablets by mouth two times a day  #120 x 3   Entered and Authorized by:   Doneen Poisson MD   Signed by:   Doneen Poisson MD on 06/11/2010   Method used:   Electronically to        [facility] (retail)       [address]       [place], [place]  [id number]       Ph: [phone]       Fax: [phone]   RxID:   [id number]

## 2010-11-30 NOTE — Assessment & Plan Note (Signed)
Summary: COU/CH  Anticoagulant Therapy Managed by: [name]. Janie Morning  PharmD CACP PCP: [name] [facility] AttendingRogelia Boga MD, Elizabeth Indication 1: Atrial fibrillation Indication 2: Encounter for therapeutic drug monitoring  V58.83 Start date: 09/15/2007  Patient Assessment Reviewed by: [name] PharmD  November 02, 2009 Medication review: verified warfarin dosage & schedule,verified previous prescription medications, verified doses & any changes, verified new medications, reviewed OTC medications, reviewed OTC health products-vitamins supplements etc Complications: none Dietary changes: none   Health status changes: none   Lifestyle changes: none   Recent/future hospitalizations: none   Recent/future procedures: none   Recent/future dental: none Patient Assessment Part 2:  Have you MISSED ANY DOSES or CHANGED TABLETS?  No missed Warfarin doses or changed tablets.  Have you had any BRUISING or BLEEDING ( nose or gum bleeds,blood in urine or stool)?  No reported bruising or bleeding in nose, gums, urine, stool.  Have you STARTED or STOPPED any MEDICATIONS, including OTC meds,herbals or supplements?  No other medications or herbal supplements were started or stopped.  Have you CHANGED your DIET, especially green vegetables,or ALCOHOL intake?  No changes in diet or alcohol intake.  Have you had any ILLNESSES or HOSPITALIZATIONS?  No reported illnesses or hospitalizations  Have you had any signs of CLOTTING?(chest discomfort,dizziness,shortness of breath,arms tingling,slurred speech,swelling or redness in leg)    No chest discomfort, dizziness, shortness of breath, tingling in arm, slurred speech, swelling, or redness in leg.     Treatment  Target INR: 2.0-3.0 INR: 2.0  Date: 11/02/2009 Regimen In:  27.5mg /week INR reflects regimen in: 2.0  New  Tablet strength: : 5mg  Regimen Out:     Sunday: 1 Tablet     Monday: 1 Tablet     Tuesday: 1 Tablet     Wednesday:  1/2 Tablet     Thursday: 1 Tablet      Friday: 1 Tablet     Saturday: 1 Tablet Total Weekly: 32.5mg /week mg  Next INR Due: 11/23/2009 Adjusted by: [name]. [name] PharmD CACP   Return to anticoagulation clinic:  11/23/2009 Time of next visit: 0930    Allergies: No Known Drug Allergies

## 2010-11-30 NOTE — Progress Notes (Signed)
Summary: Lab results//kg  ---- Converted from flag ---- ---- 05/28/2010 1:29 PM, Vassie Loll MD wrote: Lorel Monaco; call Ms. Macmasters and let her know that her Vit D and Thyroid function are abnormal and needs medications for them; I sent them electronically to [facility] and will like for her to start repletion.   Thanks!!!! ------------------------------  Phone Note Outgoing Call Call back at [facility] Phone [phone]   Call placed by: Cynda Familia [facility]),  May 31, 2010 1:31 PM Call placed to: Patient Summary of Call: Call made to pt to inform her of  lab results and new rx.  Pt states she is reluctant to take the synthroid because didnt like what she read about the possible side effects of this med. Instructed pt to contact the office if she decides not to take it.

## 2010-11-30 NOTE — Assessment & Plan Note (Signed)
Summary: ACUTE-LOSING WEIGHT-BOGGALA/CFB   Vital Signs:  Patient profile:   77 year old female Height:      64 inches (162.56 cm) Weight:      145.1 pounds (65.95 kg) BMI:     25.00 O2 Sat:      100 % on Room air Temp:     96.9 degrees F Pulse rate:   55 / minute BP sitting:   108 / 65  (left arm) Cuff size:   regular  Vitals Entered By: Dorie Rank RN (May 25, 2010 8:37 AM)  O2 Flow:  Room air CC: appetite declining over past year - ever since came home from hospital in April, increased wt loss "seems like a pound a day lately" - Tired, weak Is Patient Diabetic? No Pain Assessment Patient in pain? no      Nutritional Status BMI of 25 - 29 = overweight  Have you ever been in a relationship where you felt threatened, hurt or afraid?Unable to ask  Domestic Violence Intervention granddaughter at side  Does patient need assistance? Functional Status Self care, Cook/clean, Shopping, Social activities Ambulation Impaired:Risk for fall, Wheelchair Comments able to feed and help dress self but needs asst with most other ADL and ambulation - gait halting and unsteady   Primary Care Provider:  [name]  CC:  appetite declining over past year - ever since came home from hospital in April, increased wt loss "seems like a pound a day lately" - Tired, and weak.  History of Present Illness: 77 y/o female with pmh as describedin the EMR; who comes to the clinic complaining of acute weight loss. Patient has documented unintentional 25 pounds lost since July 2010; she denies any melena, hematochezia, abdominal pain, nausea, vomiting, CP, fever or SOB.  Per patient her appetite is the main problem; she is also reporting decreased energy and weakness; also complaining of decrease sleep at night.  Patient is unable to cook as she use to in the past due to pain on her legs and arms; she leaves by herself and daughter come by every day for a while, but unable to stay.   Preventive  Screening-Counseling & Management  Alcohol-Tobacco     Alcohol drinks/day: 0     Smoking Status: quit     Year Quit: ABOUT 40+ YEARS A GO  Caffeine-Diet-Exercise     Caffeine use/day: 1 cup o f hot tea     Does Patient Exercise: no     Exercise Counseling: to improve exercise regimen  Problems Prior to Update: 1)  Herpes Zoster  (ICD-053.9) 2)  Neoplasm of Uncertain Behavior of Skin  (ICD-238.2) 3)  Epistaxis  (ICD-784.7) 4)  Hemoccult Positive Stool  (ICD-578.1) 5)  Chronic Diastolic Heart Failure  (ICD-428.32) 6)  Cerebellar Infarct  (ICD-436) 7)  Thyroid Stimulating Hormone, Abnormal  (ICD-246.9) 8)  Headache, Chronic  (ICD-784.0) 9)  Encounter For Therapeutic Drug Monitoring  (ICD-V58.83) 10)  Atrial Fibrillation  (ICD-427.31) 11)  Weight Loss  (ICD-783.21) 12)  Fatigue  (ICD-780.79) 13)  Hypokalemia  (ICD-276.8) 14)  Arthritis, Rheumatoid  (ICD-714.0) 15)  Anxiety  (ICD-300.00) 16)  Glaucoma  (ICD-365.9) 17)  Renal Insufficiency  (ICD-588.9) 18)  Hyperlipidemia  (ICD-272.4) 19)  Gouty Tophi  (ICD-274.82) 20)  Reflux, Esophageal  (ICD-530.81) 21)  Hiatal Hernia  (ICD-553.3) 22)  Hypertension  (ICD-401.9) 23)  Pulmonary Fibrosis, Interstitial  (ICD-516.3)  Current Problems (verified): 1)  Herpes Zoster  (ICD-053.9) 2)  Neoplasm of Uncertain Behavior of Skin  (  ICD-238.2) 3)  Epistaxis  (ICD-784.7) 4)  Hemoccult Positive Stool  (ICD-578.1) 5)  Chronic Diastolic Heart Failure  (ICD-428.32) 6)  Cerebellar Infarct  (ICD-436) 7)  Thyroid Stimulating Hormone, Abnormal  (ICD-246.9) 8)  Headache, Chronic  (ICD-784.0) 9)  Encounter For Therapeutic Drug Monitoring  (ICD-V58.83) 10)  Atrial Fibrillation  (ICD-427.31) 11)  Weight Loss  (ICD-783.21) 12)  Fatigue  (ICD-780.79) 13)  Hypokalemia  (ICD-276.8) 14)  Arthritis, Rheumatoid  (ICD-714.0) 15)  Anxiety  (ICD-300.00) 16)  Glaucoma  (ICD-365.9) 17)  Renal Insufficiency  (ICD-588.9) 18)  Hyperlipidemia  (ICD-272.4) 19)   Gouty Tophi  (ICD-274.82) 20)  Reflux, Esophageal  (ICD-530.81) 21)  Hiatal Hernia  (ICD-553.3) 22)  Hypertension  (ICD-401.9) 23)  Pulmonary Fibrosis, Interstitial  (ICD-516.3)  Medications Prior to Update: 1)  Probenecid 500 Mg Tabs (Probenecid) .... Take 1 Tablet By Mouth Two Times A Day 2)  Colchicine 0.6 Mg Tabs (Colchicine) .... Take 1 Tablet By Two Times A Day 3)  Metoprolol Tartrate 100 Mg Tabs (Metoprolol Tartrate) .... Take 1  Tablet By Mouth Two Times A Day 4)  Valium 5 Mg Tabs (Diazepam) .... Take 1 Tablet By Mouth Two Times A Day As Needed 5)  Clonidine Hcl 0.2 Mg Tabs (Clonidine Hcl) .... Take 1 Tablet By Mouth Two Times A Day 6)  Tylenol  Tabs (Acetaminophen Tabs) .... Use As Directed As Needed 7)  Klor-Con M20 20 Meq Tbcr (Potassium Chloride Crys Cr) .... Take 2 Tablets By Mouth Two Times A Day 8)  Xalatan 0.005 % Soln (Latanoprost) .... Use As Directed 9)  Lasix 40 Mg Tabs (Furosemide) .... Take 1 Tablet By Mouth in The Morning and 1 Tablet in The Afternoon. 10)  Nexium 40 Mg  Cpdr (Esomeprazole Magnesium) .... Take One By Mouth Daily 11)  Warfarin Sodium 5 Mg Tabs (Warfarin Sodium) .... Take No Tablets On Wednesdays and Saturdays, and 1/2 Tablet On All Other Days of The Week 12)  Zantac 75 75 Mg Tabs (Ranitidine Hcl) .... Prn 13)  Vicodin 5-500 Mg Tabs (Hydrocodone-Acetaminophen) .... Take 1 Tablet By Mouth Every 6 Hours As Needed For Pain 14)  Zofran 4 Mg Tabs (Ondansetron Hcl) .Marland Kitchen.. 1 Tablet Every 8 Hours As Needed For Nausea 15)  Cardizem 60 Mg Tabs (Diltiazem Hcl) .... Take 1 Tab By Mouth Every Six Hours  Current Medications (verified): 1)  Probenecid 500 Mg Tabs (Probenecid) .... Take 1 Tablet By Mouth Two Times A Day 2)  Colchicine 0.6 Mg Tabs (Colchicine) .... Take 1 Tablet By Two Times A Day 3)  Metoprolol Tartrate 100 Mg Tabs (Metoprolol Tartrate) .... Take 1  Tablet By Mouth Two Times A Day 4)  Valium 5 Mg Tabs (Diazepam) .... Take 1 Tablet By Mouth Two Times A  Day As Needed 5)  Clonidine Hcl 0.2 Mg Tabs (Clonidine Hcl) .... Take 1 Tablet By Mouth Two Times A Day 6)  Tylenol  Tabs (Acetaminophen Tabs) .... Use As Directed As Needed 7)  Klor-Con M20 20 Meq Tbcr (Potassium Chloride Crys Cr) .... Take 2 Tablets By Mouth Two Times A Day 8)  Xalatan 0.005 % Soln (Latanoprost) .... Use As Directed 9)  Lasix 40 Mg Tabs (Furosemide) .... Take 1 Tablet By Mouth in The Morning and 1 Tablet in The Afternoon. 10)  Warfarin Sodium 5 Mg Tabs (Warfarin Sodium) .... Take No Tablets On Wednesdays and Saturdays, and 1/2 Tablet On All Other Days of The Week 11)  Zantac 75 75 Mg Tabs (Ranitidine Hcl) .Marland KitchenMarland KitchenMarland Kitchen  Prn 12)  Cardizem 60 Mg Tabs (Diltiazem Hcl) .... Take 1 Tab By Mouth Every Six Hours  Allergies (verified): 1)  ! * Pain Pills "just About All of Them"  Past History:  Past Medical History: Last updated: 01/05/2009 HTN A.fib on chronic coumadin therapy. Newly diagnosed Diastolic heart failure (Feb 8th,2010) Gout RA Mild chronic renal insuffiency Glaucoma s/p right cataract surgery  Past Surgical History: Last updated: 05/18/2009 none  Family History: Last updated: 05/18/2009 Father --CHD Mother --DM  Social History: Last updated: 05/18/2009 Has medicare. Lives alone at home.  Has 3 children in town which she is close to and is able to rely on if needed.  High school ducation. 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  Risk Factors: Alcohol Use: 0 (05/25/2010) Caffeine Use: 1 cup o f hot tea (05/25/2010) Exercise: no (05/25/2010)  Risk Factors: Smoking Status: quit (05/25/2010)  Review of Systems       The patient complains of anorexia, weight loss, and muscle weakness.  The patient denies fever, chest pain, syncope, peripheral edema, prolonged cough, abdominal pain, melena, hematochezia, severe indigestion/heartburn, hematuria, and incontinence.    Physical Exam  General:  alert, well-hydrated, and underweight appearing.   Lungs:   Normal respiratory effort, chest expands symmetrically. Lungs are clear to auscultation, no crackles or wheezes. Heart:  Normal rate and regular rhythm. S1 and S2 normal without gallop, murmur, click, rub or other extra sounds. Abdomen:  Bowel sounds positive,abdomen soft and non-tender without masses, organomegaly or hernias noted. Msk:  Patient described pain all over her joints, but especially ankles and shoulders. No joint swelling, no joint warmth, and no redness over joints.   Extremities:  No clubbing, cyanosis or edema.   Neurologic:  alert & oriented X3.  cranial nerves II-XII intact. Decreased strenght bilaterally  3-4/5 (probably due to poor effort). Patient is using a wheelchair today. Sensation is intact.    Impression & Recommendations:  Problem # 1:  HYPOTHYROIDISM (ICD-244.9)  Symptoms of poor energy, increased fatigue, sleepdisorder and also poor appetite can be associated to hypothyroidism; patient elevated TSH.Will start treatment with synthroid daily and follow levels again 3-4 months.  Her updated medication list for this problem includes:    Synthroid 88 Mcg Tabs (Levothyroxine sodium) .Marland Kitchen... Take 1 tablet by mouth once a day.(take medication in empty stomach and apart from other drugs)  Problem # 2:  VITAMIN D DEFICIENCY (ICD-268.9) Labs done today dmonstrated and Vit D level of 18, will start repletion forthe next 12 weeks with weekly high dose VIt D (50000); will repeat labs after whole acute repletion is done and will calculate maintenance dose.  Problem # 3:  WEIGHT LOSS (ICD-783.21) Due to poor appetite. Labs demonstrating a thyroid dysfunction. Will start treatment for hypothyroidism and will follow her symptoms along. Patient will also continue daily use of ensure to boost appetite and to help with calorie intake.  Orders: T-TSH [phone]) T-T4, Free [phone]) T-Vitamin D (25-Hydroxy) [phone]) T-CBC w/Diff [phone]) T-Vitamin  B12 [phone]) Social Work Referral (Social )  Problem # 4:  HYPERTENSION (ICD-401.9) Well controlled and at goal. Will continue current regimen. Patient renalfunction and electrolytes at baseline. Patient advised to follow a low sodium diet.  Her updated medication list for this problem includes:    Metoprolol Tartrate 100 Mg Tabs (Metoprolol tartrate) .Marland Kitchen... Take 1  tablet by mouth two times a day    Clonidine Hcl 0.2 Mg Tabs (Clonidine hcl) .Marland Kitchen... Take 1 tablet  by mouth two times a day    Lasix 40 Mg Tabs (Furosemide) .Marland Kitchen... Take 1 tablet by mouth in the morning and 1 tablet in the afternoon.    Cardizem 60 Mg Tabs (Diltiazem hcl) .Marland Kitchen... Take 1 tab by mouth every six hours  BP today: 108/65 Prior BP: 123/80 (01/04/2010)  Prior 10 Yr Risk Heart Disease: 8 % (05/18/2009)  Labs Reviewed: K+: 4.1 (06/22/2009) Creat: : 1.62 (06/22/2009)   Chol: 172 (04/06/2009)   HDL: 46 (04/06/2009)   LDL: 99 (04/06/2009)   TG: 134 (04/06/2009)  Problem # 5:  REFLUX, ESOPHAGEAL (ICD-530.81) Well controlled with the use of zantac. Insurance company will not pay for zantac 75mg  daily; but will pay for 150mg .Since patient is not using nexium will change drugs to 150mg  daily and will provide lifestyle modifications intructionsfor better control of her GERD.  The following medications were removed from the medication list:    Nexium 40 Mg Cpdr (Esomeprazole magnesium) .Marland Kitchen... Take one by mouth daily Her updated medication list for this problem includes:    Zantac 150 Mg Tabs (Ranitidine hcl) .Marland Kitchen... Take 1 tablet by mouth once a day  Problem # 6:  GOUTY TOPHI (ICD-274.82) Uric acid 11.8; willrefill hercolchicine and also her probenecid and will provide a list offoodto avoid.  Her updated medication list for this problem includes:    Probenecid 500 Mg Tabs (Probenecid) .Marland Kitchen... Take 1 tablet by mouth two times a day    Colchicine 0.6 Mg Tabs (Colchicine) .Marland Kitchen... Take 1 tablet by two times a day  Problem # 7:   ATRIAL FIBRILLATION (ICD-427.31) Rate controlled.Willcontinue current regimen and will encourage patient to continue following with Dr. Alexandria Lodge for coumadin adjustments.  Her updated medication list for this problem includes:    Metoprolol Tartrate 100 Mg Tabs (Metoprolol tartrate) .Marland Kitchen... Take 1  tablet by mouth two times a day    Warfarin Sodium 5 Mg Tabs (Warfarin sodium) .Marland Kitchen... Take no tablets on wednesdays and saturdays, and 1/2 tablet on all other days of the week    Cardizem 60 Mg Tabs (Diltiazem hcl) .Marland Kitchen... Take 1 tab by mouth every six hours  Problem # 8:  HYPERLIPIDEMIA (ICD-272.4) LDL at goal (which is less than 100); will advised her to follow a low fat diet andwill continue monitoring her lipid profile sporadically to determine needs of statins.  Orders: T-Lipid Profile ([id number])  Problem # 9:  RENAL INSUFFICIENCY (ICD-588.9) Assessment: Deteriorated Stable.Will continue controlling her BPand will ask to her to followa low sodium and low protein.  Complete Medication List: 1)  Probenecid 500 Mg Tabs (Probenecid) .... Take 1 tablet by mouth two times a day 2)  Colchicine 0.6 Mg Tabs (Colchicine) .... Take 1 tablet by two times a day 3)  Metoprolol Tartrate 100 Mg Tabs (Metoprolol tartrate) .... Take 1  tablet by mouth two times a day 4)  Valium 5 Mg Tabs (Diazepam) .... Take 1 tablet by mouth two times a day as needed 5)  Clonidine Hcl 0.2 Mg Tabs (Clonidine hcl) .... Take 1 tablet by mouth two times a day 6)  Tylenol Tabs (Acetaminophen tabs) .... Use as directed as needed 7)  Klor-con M20 20 Meq Tbcr (Potassium chloride crys cr) .... Take 2 tablets by mouth two times a day 8)  Xalatan 0.005 % Soln (Latanoprost) .... Use as directed 9)  Lasix 40 Mg Tabs (Furosemide) .... Take 1 tablet by mouth in the morning and 1 tablet in the afternoon. 10)  Warfarin Sodium 5  Mg Tabs (Warfarin sodium) .... Take no tablets on wednesdays and saturdays, and 1/2 tablet on all other days of the  week 11)  Zantac 150 Mg Tabs (Ranitidine hcl) .... Take 1 tablet by mouth once a day 12)  Cardizem 60 Mg Tabs (Diltiazem hcl) .... Take 1 tab by mouth every six hours 13)  Drisdol 78295 Unit Caps (Ergocalciferol) .... Take 1 capsuleby mouth every week, for 12 weeks. 14)  Synthroid 88 Mcg Tabs (Levothyroxine sodium) .... Take 1 tablet by mouth once a day.(take medication in empty stomach and apart from other drugs)  Other Orders: T-Comprehensive Metabolic Panel ([id number]) T-Uric Acid (Blood) ([id number]) Mammogram (Screening) (Mammo)  Patient Instructions: 1)  Take medications as prescribed. 2)  You will be called with any abnormalities in the tests scheduled or performed today.  If you don't hear from [place] within a week from when the test was performed, you can assume that your test was normal. 3)  Make sure you drink at least 2 ensure per day to guarantee enough calories intake. 4)  Our social worker will try to get some help and assistance for you; make sure you colaborate with her. 5)  Please schedule a follow-up appointment in 2 months. 6)  Medications has been refilled and sent electronically to [facility]. Prescriptions: SYNTHROID 88 MCG TABS (LEVOTHYROXINE SODIUM) Take 1 tablet by mouth once a day.(take medication in empty stomach and apart from other drugs)  #31 x 3   Entered and Authorized by:   Vassie Loll MD   Signed by:   Vassie Loll MD on 05/28/2010   Method used:   Electronically to        [facility] (retail)       [address]       [place], [place]  [address]       Ph: [phone]       Fax: [phone]   RxID:   [id number] DRISDOL 06301 UNIT CAPS (ERGOCALCIFEROL) Take 1 capsuleby mouth every week, for 12 weeks.  #12 x 0   Entered and Authorized by:   Vassie Loll MD   Signed by:   Vassie Loll MD on 05/28/2010   Method used:   Electronically to        [facility] (retail)        [address]       [place], [place]  [address]       Ph: [phone]       Fax: [phone]   RxID:   [id number] ZANTAC 150 MG TABS (RANITIDINE HCL) Take 1 tablet by mouth once a day  #31 x 5   Entered and Authorized by:   Vassie Loll MD   Signed by:   Vassie Loll MD on 05/28/2010   Method used:   Electronically to        [facility] (retail)       [address]       [place], [place]  [address]       Ph: [phone]       Fax: [phone]   RxID:   [id number] CARDIZEM 60 MG TABS (DILTIAZEM HCL) Take 1 tab by mouth every six hours  #120 x 5   Entered and Authorized by:   Vassie Loll MD   Signed by:   Vassie Loll MD on 05/25/2010   Method used:   Electronically to        [facility] (retail)       [address]       [place], [place]  [address]       Ph: [phone]       Fax: [phone]   RxID:   [id number] ZANTAC 75 75 MG TABS (RANITIDINE HCL) PRN  #30 x 5   Entered and Authorized by:   Vassie Loll MD   Signed by:   Vassie Loll MD on 05/25/2010   Method used:   Electronically to        [facility] (retail)       [address]       [place], [place]  [address]       Ph: [phone]       Fax: [phone]   RxID:   [id number] LASIX 40 MG TABS (FUROSEMIDE) Take 1 tablet by mouth in the morning and 1 tablet in the afternoon.  #60 x 5   Entered and Authorized by:   Vassie Loll MD   Signed by:   Vassie Loll MD on 05/25/2010   Method used:   Electronically to        [facility] (retail)       [address]       [place], [place]  [address]       Ph: [phone]       Fax: [phone]   RxID:   [id number] XALATAN 0.005 % SOLN (LATANOPROST) Use as directed  #1 x 5   Entered and Authorized by:   Vassie Loll MD   Signed by:   Vassie Loll MD on 05/25/2010   Method used:   Electronically to        [facility] (retail)       [address]       [place], [place]  [address]       Ph: [phone]       Fax: [phone]   RxID:   [id number] CLONIDINE HCL 0.2 MG TABS (CLONIDINE HCL) Take 1 tablet by mouth two times a day  #60 x 6   Entered and Authorized by:   Vassie Loll MD   Signed by:   Vassie Loll MD on 05/25/2010   Method used:   Electronically to        [facility] (retail)       [address]       [place], [place]  [address]       Ph: [phone]       Fax: [phone]   RxID:   [id number] COLCHICINE 0.6 MG TABS (COLCHICINE) Take 1 tablet by two times a day  #62 x 6   Entered and Authorized by:   Vassie Loll MD   Signed by:   Vassie Loll MD on 05/25/2010   Method used:   Electronically to        [facility] (retail)       [address]       [place], [place]  [address]       Ph: [phone]       Fax: [phone]   RxID:   [id number] PROBENECID 500 MG TABS (PROBENECID) Take 1 tablet by mouth two times a day  #60 x 6   Entered and Authorized by:   Vassie Loll MD   Signed by:   Vassie Loll MD on 05/25/2010   Method used:   Electronically to  [facility]* (retail)       [address]       [place], [place]  [address]       Ph: [phone]       Fax: [phone]   RxID:   [id number]  Process Orders Check Orders Results:     [facility]: Order checked:     [phone] -- T-Vitamin D (25-Hydroxy) -- ABN required due to diagnosis (CPT: [phone]) Tests Sent for requisitioning (May 28, 2010 1:02 PM):     05/25/2010: [facility] -- T-Lipid Profile [id number] (signed)     05/25/2010: [facility] -- T-Comprehensive Metabolic Panel [[id number]] (signed)     05/25/2010: [facility] -- T-TSH [phone] (signed)     05/25/2010: [facility] -- [place], [place]  [[id number]] (signed)     05/25/2010: [facility] -- T-Vitamin D (25-Hydroxy) [id number] (signed)     05/25/2010: [facility] -- T-CBC w/Diff [[id number]] (signed)     05/25/2010: [facility] -- T-Vitamin B12 [id number] (signed)     05/25/2010: [facility] -- T-Uric Acid (Blood) [phone] (signed)    Prevention & Chronic Care Immunizations   Influenza vaccine: Not documented   Influenza vaccine deferral: Deferred  (05/25/2010)   Influenza vaccine due: 07/01/2009    Tetanus booster: Not documented   Td booster deferral: Refused  (05/25/2010)    Pneumococcal vaccine: Not documented   Pneumococcal vaccine deferral: Refused  (05/25/2010)    H. zoster vaccine: Not documented  Colorectal Screening   Hemoccult: Not documented   Hemoccult action/deferral: Refused  (05/25/2010)    Colonoscopy: Not documented   Colonoscopy action/deferral: Refused  (05/25/2010)  Other Screening   Pap smear: Not documented   Pap smear action/deferral: Refused  (04/20/2009)    Mammogram: Assessment: BIRADS 1.   (04/09/2008)   Mammogram action/deferral: Ordered  (05/25/2010)   Mammogram due: 03/2009    DXA bone density scan: Not documented   DXA bone density action/deferral: Not indicated  (04/20/2009)   Smoking status: quit  (05/25/2010)  Lipids   Total Cholesterol: 172  (04/06/2009)   Lipid panel action/deferral: Lipid Panel ordered   LDL: 99  (04/06/2009)   LDL Direct: Not documented   HDL: 46  (04/06/2009)   Triglycerides: 134  (04/06/2009)    SGOT (AST): 35  (07/24/2008)   BMP action: Ordered   SGPT (ALT): 23  (07/24/2008) CMP ordered    Alkaline phosphatase: 83  (07/24/2008)   Total bilirubin: 1.1  (07/24/2008)    Lipid flowsheet reviewed?: Yes   Progress toward LDL goal: Improved  Hypertension   Last Blood Pressure: 108 / 65  (05/25/2010)   Serum creatinine: 1.62  (06/22/2009)   BMP action: Ordered    Serum potassium 4.1  (06/22/2009) CMP ordered     Hypertension flowsheet reviewed?: Yes   Progress toward BP goal: At goal  Self-Management Support :    Patient will work on the following items until the next clinic visit to reach self-care goals:     Medications and monitoring: take my medicines every day, bring all of my medications to every visit  (05/25/2010)     Eating: eat more vegetables, use fresh or frozen vegetables, eat foods that are low in salt  (01/04/2010)     Other: not watching types of food she eats since so little - also takes potassium and warfarin meds - unable to exercise due to fatigue and arthritis  (05/25/2010)    Hypertension  self-management support: Written self-care plan  (05/25/2010)   Hypertension self-care plan printed.    Lipid self-management support: Written self-care plan  (05/25/2010)   Lipid self-care plan printed.   Nursing Instructions: Schedule screening mammogram (see order)

## 2010-11-30 NOTE — Progress Notes (Signed)
Summary: Soc. Work  Programme researcher, broadcasting/film/video of Call: Called patient directly to find out if she was still interested in [facility] since gdaughter was not any help in connecting patient.  Patient said she was available today and so I called Star to let her know to call patient directly rather than deal with gdaughter who is 74 yrs old and in school and quite busy.    Star said she would call patient today.

## 2010-11-30 NOTE — Miscellaneous (Signed)
Summary: [facility] Discharge  Date of admission: 01/26/10  Date of discharge: 02/02/10  Brief reason for admission/active problems: Pt was admitted with a supratherapeutic INR and A-fib with RVR.  She was cardioverted and had her rate control titrated up.  She developed acute CHF with pulmonary edema in the context of being off of lasix and getting fluid.  She responded to diuresis and was put back on by mouth lasix at dc.  Followup needed: She wil follow up with her cardiologist prior to her PCP visit.  At [facility] please make sure that she is taking her diltiazem and lopressor as prescribed.  Make sure see saw Dr. Algie Coffer.  Make sure she has been following up with Dr. Alexandria Lodge regarding her coumadin.  Inquire about any palpitations, SOB.  Address her Lasix dose and make sure she is adequately volume controlled.  Check a B-Met to eval her CRI and her potassium in the context of changing Lasix doses.  The medication and problem lists have been updated.  Please see the dictated discharge summary for details.   Prescriptions: CARDIZEM 60 MG TABS (DILTIAZEM HCL) Take 1 tab by mouth every six hours  #120 x 0   Entered and Authorized by:   Brooks Sailors MD   Signed by:   Brooks Sailors MD on 02/02/2010   Method used:   Print then Give to Patient   RxID:   [id number] WARFARIN SODIUM 5 MG TABS (WARFARIN SODIUM) Take 1/2 tablet by mouth on M, W, F once a day and 1 tab by mouth on every other day.  #30 x 0   Entered and Authorized by:   Brooks Sailors MD   Signed by:   Brooks Sailors MD on 02/02/2010   Method used:   Print then Give to Patient   RxID:   [id number] LASIX 40 MG TABS (FUROSEMIDE) Take 1 tablet by mouth in the morning and 1 tablet in the afternoon.  #60 x 0   Entered and Authorized by:   Brooks Sailors MD   Signed by:   Brooks Sailors MD on 02/02/2010   Method used:   Print then Give to Patient   RxID:   [id number] METOPROLOL TARTRATE 100 MG TABS (METOPROLOL TARTRATE) Take 1  tablet by  mouth two times a day  #60 x 0   Entered and Authorized by:   Brooks Sailors MD   Signed by:   Brooks Sailors MD on 02/02/2010   Method used:   Print then Give to Patient   RxID:   [id number]    Patient Instructions: 1)  Please come to your appointment with Dr. Alexandria Lodge on Monday 4/11 at 11:00am. 2)  Please go to your appointment with Dr. Algie Coffer on Tuesday 4/12 at 11:30am. 3)  Please come to your follow up appointment with Dr. [name] on 03/04/10 at 2:10pm. 4)  You will be taking some new medicines: Cardizem 60mg  by mouth every 6 hrs. 5)  Your coumadin dosing will be 1 tab (5mg ) Tuesdays, Thursdays, Saturday, and Sunday, and 1/2 tab (2.5 mg) Monday, Wednesday, and Friday (until you see Dr. Alexandria Lodge). 6)  You will take less Lopressor: 100mg  by mouth two times a day. 7)  If you have palpitations, chest pain, or worsening SOB either call the clinic or come back to the ED.

## 2010-11-30 NOTE — Progress Notes (Signed)
Summary: med refill/gp  Phone Note Refill Request Message from:  Fax from Pharmacy on Mar 26, 2010 12:40 PM  Refills Requested: Medication #1:  METOPROLOL TARTRATE 100 MG TABS Take 1  tablet by mouth two times a day   Last Refilled: 02/23/2010  Medication #2:  WARFARIN SODIUM 5 MG TABS Take 1/2 tablet by mouth on M   Last Refilled: 02/23/2010  Method Requested: Electronic Initial call taken by: Chinita Pester RN,  Mar 26, 2010 12:40 PM  Follow-up for Phone Call        rx written Follow-up by: Acey Lav MD,  Mar 26, 2010 2:22 PM    New/Updated Medications: WARFARIN SODIUM 5 MG TABS (WARFARIN SODIUM) Take NO tablets on Wednesdays and Saturdays, and 1/2 tablet on all other days of the week Prescriptions: METOPROLOL TARTRATE 100 MG TABS (METOPROLOL TARTRATE) Take 1  tablet by mouth two times a day  #60 x 4   Entered and Authorized by:   Acey Lav MD   Signed by:   Paulette Blanch Dam MD on 03/26/2010   Method used:   Electronically to        [facility] (retail)       [address]       [place], [place]  [address]       Ph: [phone]       Fax: [phone]   RxID:   [id number] WARFARIN SODIUM 5 MG TABS (WARFARIN SODIUM) Take NO tablets on Wednesdays and Saturdays, and 1/2 tablet on all other days of the week  #12 x 4   Entered and Authorized by:   Acey Lav MD   Signed by:   Paulette Blanch Dam MD on 03/26/2010   Method used:   Electronically to        [facility] (retail)       [address]       [place], [place]  [address]       Ph: [phone]       Fax: [phone]   RxID:   [id number]

## 2010-11-30 NOTE — Progress Notes (Signed)
Summary: phone call/gp  Phone Note Call from Patient   Summary of Call: Pt. requests  an ointment to use on shingles rash which has healed; scabbing and itching.  Also wants something for nausea.  She was seen 3/7 by Dr. Tobie Lords; she was given Zofran which she no more (said she took it q4h as she was told).  Also she has not taken Valtrex as ordered by Dr. Tobie Lords. I called [facility] it was too early for her insurance to pay for it. But it will be delivered today. Thanks Initial call taken by: Chinita Pester RN,  January 21, 2010 9:21 AM  Follow-up for Phone Call        The best advice for shingles rash is to wash skin with soap and water and keep it dry and clean.  Creams have no role in the tx.  I have heard that gentle astringent soaks can help but have no experience prescribing them.  If she is still experiencing pain,it is likely post herpetic neuralgia and needs appt for tx.  For nausea, i'll refill short course of zofran but again if she is having significant enough sxs that need RX meds,she probably needs eval. Follow-up by: Blanch Media MD,  January 21, 2010 10:24 AM  Additional Follow-up for Phone Call Additional follow up Details #1::        I talked to pt. about washing skin w/soap and water and to keep it dry. Also about new Rx for Zofran per Dr. Rogelia Boga.  Instructed her to call for an appt. if nausea continues; she agreed. Additional Follow-up by: Chinita Pester RN,  January 21, 2010 3:09 PM    New/Updated Medications: ZOFRAN 4 MG TABS (ONDANSETRON HCL) 1 tablet every 8 hours as needed for nausea Prescriptions: ZOFRAN 4 MG TABS (ONDANSETRON HCL) 1 tablet every 8 hours as needed for nausea  #6 x 0   Entered and Authorized by:   Blanch Media MD   Signed by:   Chinita Pester RN on 01/21/2010   Method used:   Electronically to        [facility] (retail)       [address]       [place], [place]  [address]       Ph: [phone]       Fax:  [phone]   RxID:   [id number]

## 2010-11-30 NOTE — Progress Notes (Signed)
Summary: PREVENTIVE COLONOSCOPY  Phone Note Outgoing Call   Summary of Call: Checked EMR,ECHART and Old paperchart.  No information on patient ever having a colonoscopy performed. Pulled old paperchart,  patient in 2005 refused to have a colonoscopy and as of 2010 has also refused colonoscopy.  Please refer to the ORDERS in the EMR as stated by nurse Stanton Kidney Ditzler, patient does not want one done. Initial call taken by: Shon Hough,  September 17, 2010 3:08 PM

## 2010-11-30 NOTE — Assessment & Plan Note (Signed)
Summary: COU/CH  Anticoagulant Therapy Managed by: [name]. Janie Morning  PharmD CACP PCP: [name] [facility] AttendingRogelia Boga MD, Elizabeth Indication 1: Atrial fibrillation Indication 2: Encounter for therapeutic drug monitoring  V58.83 Start date: 09/15/2007  Patient Assessment Reviewed by: [name] PharmD  June 28, 2010 Medication review: verified warfarin dosage & schedule,verified previous prescription medications, verified doses & any changes, verified new medications, reviewed OTC medications, reviewed OTC health products-vitamins supplements etc Complications: none Dietary changes: none   Health status changes: none   Lifestyle changes: none   Recent/future hospitalizations: none   Recent/future procedures: none   Recent/future dental: none Patient Assessment Part 2:  Have you MISSED ANY DOSES or CHANGED TABLETS?  No missed Warfarin doses or changed tablets.  Have you had any BRUISING or BLEEDING ( nose or gum bleeds,blood in urine or stool)?  No reported bruising or bleeding in nose, gums, urine, stool.  Have you STARTED or STOPPED any MEDICATIONS, including OTC meds,herbals or supplements?  No other medications or herbal supplements were started or stopped.  Have you CHANGED your DIET, especially green vegetables,or ALCOHOL intake?  No changes in diet or alcohol intake.  Have you had any ILLNESSES or HOSPITALIZATIONS?  No reported illnesses or hospitalizations  Have you had any signs of CLOTTING?(chest discomfort,dizziness,shortness of breath,arms tingling,slurred speech,swelling or redness in leg)    No chest discomfort, dizziness, shortness of breath, tingling in arm, slurred speech, swelling, or redness in leg.     Treatment  Target INR: 2.0-3.0 INR: 3.5  Date: 06/28/2010 Regimen In:  22.5mg /wk INR reflects regimen in: 3.5  New  Tablet strength: : 5mg  Regimen Out:     Sunday: 1/2 Tablet     Monday: 1/2 Tablet     Tuesday: 1/2 Tablet  Wednesday: 1 Tablet     Thursday: 1/2 Tablet      Friday: 1/2 Tablet     Saturday: 1/2 Tablet Total Weekly: 20mg /wk mg  Next INR Due: 07/19/2010 Adjusted by: [name]. [name] PharmD CACP   Return to anticoagulation clinic:  07/19/2010 Time of next visit: 1000    Allergies: 1)  ! * Pain Pills "just About All of Them"

## 2010-11-30 NOTE — Assessment & Plan Note (Signed)
Summary: [name]/FU/MEDS/CFB   Vital Signs:  Patient profile:   77 year old female Height:      64.25 inches Weight:      158.5 pounds BMI:     27.09 Pulse rate:   66 / minute BP sitting:   130 / 60  (right arm)  Vitals Entered By: Filomena Jungling NT II (November 23, 2009 9:35 AM) CC: checkup Is Patient Diabetic? No Nutritional Status BMI of 25 - 29 = overweight  Have you ever been in a relationship where you felt threatened, hurt or afraid?No   Does patient need assistance? Functional Status Self care Ambulation Normal   Primary Care Provider:  [name]  CC:  checkup.  History of Present Illness: 77 yr old lady with PMH as mentioned in the EMR comes to the office for a follow up.  I saw in her in my office for her gout and joint pains and made a referral to Dr. Dareen [name]( Rheumatology) and she reports that they never called her with an appointment. She has been having these chronic joint pains in both hands and sees a ortho (Dr. Myrtie [name]) who started her on Dexamethasone 4 mg as needed for her gout pains. She reports that she has tried several different pain medicines but Dexamethasone is "  the greatest pain medicine" that she ever had and is asking for a refill on it.  She denies any other complaints.  Preventive Screening-Counseling & Management  Alcohol-Tobacco     Alcohol drinks/day: 0     Smoking Status: quit     Year Quit: ABOUT 40+ YEARS A GO  Caffeine-Diet-Exercise     Caffeine use/day: 1 cup o f hot tea     Does Patient Exercise: no     Exercise Counseling: to improve exercise regimen  Problems Prior to Update: 1)  Neoplasm of Uncertain Behavior of Skin  (ICD-238.2) 2)  Epistaxis  (ICD-784.7) 3)  Hemoccult Positive Stool  (ICD-578.1) 4)  Chronic Diastolic Heart Failure  (ICD-428.32) 5)  Cerebellar Infarct  (ICD-436) 6)  Thyroid Stimulating Hormone, Abnormal  (ICD-246.9) 7)  Headache, Chronic  (ICD-784.0) 8)  Encounter For Therapeutic Drug  Monitoring  (ICD-V58.83) 9)  Atrial Fibrillation  (ICD-427.31) 10)  Weight Loss  (ICD-783.21) 11)  Fatigue  (ICD-780.79) 12)  Hypokalemia  (ICD-276.8) 13)  Arthritis, Rheumatoid  (ICD-714.0) 14)  Anxiety  (ICD-300.00) 15)  Glaucoma  (ICD-365.9) 16)  Renal Insufficiency  (ICD-588.9) 17)  Hyperlipidemia  (ICD-272.4) 18)  Gouty Tophi  (ICD-274.82) 19)  Reflux, Esophageal  (ICD-530.81) 20)  Hiatal Hernia  (ICD-553.3) 21)  Hypertension  (ICD-401.9) 22)  Pulmonary Fibrosis, Interstitial  (ICD-516.3)  Medications Prior to Update: 1)  Probenecid 500 Mg Tabs (Probenecid) .... Take 1 Tablet By Mouth Two Times A Day 2)  Colchicine 0.6 Mg Tabs (Colchicine) .... Take 1 Tablet By Two Times A Day 3)  Metoprolol Tartrate 100 Mg Tabs (Metoprolol Tartrate) .... Take 1 and 1/2  Tablets By Mouth Two Times A Day 4)  Valium 5 Mg Tabs (Diazepam) .... Take 1 Tablet By Mouth Two Times A Day As Needed 5)  Clonidine Hcl 0.2 Mg Tabs (Clonidine Hcl) .... Take 1 Tablet By Mouth Two Times A Day 6)  Tylenol  Tabs (Acetaminophen Tabs) .... Use As Directed As Needed 7)  Klor-Con M20 20 Meq Tbcr (Potassium Chloride Crys Cr) .... Take 2 Tablets By Mouth Two Times A Day 8)  Xalatan 0.005 % Soln (Latanoprost) .... Use As Directed 9)  Lasix 40 Mg Tabs (Furosemide) .... Take 2  Tablets By Mouth in The Morning and One Table in The Afternoon. 10)  Norvasc 10 Mg  Tabs (Amlodipine Besylate) .... Take 1 Tablet By Mouth Once A Day 11)  Nexium 40 Mg  Cpdr (Esomeprazole Magnesium) .... Take One By Mouth Daily 12)  Warfarin Sodium 5 Mg Tabs (Warfarin Sodium) .... Take 1 Tablet By Mouth Once A Day 13)  Zantac 75 75 Mg Tabs (Ranitidine Hcl) .... Prn 14)  Darvocet-N 50 50-325 Mg Tabs (Propoxyphene N-Apap) .... Take 1 Tablet By Mouth Two Times A Day As Needed For Arhtritis Pain. Caution: Drowziness  Current Medications (verified): 1)  Probenecid 500 Mg Tabs (Probenecid) .... Take 1 Tablet By Mouth Two Times A Day 2)  Colchicine 0.6 Mg  Tabs (Colchicine) .... Take 1 Tablet By Two Times A Day 3)  Metoprolol Tartrate 100 Mg Tabs (Metoprolol Tartrate) .... Take 1 and 1/2  Tablets By Mouth Two Times A Day 4)  Valium 5 Mg Tabs (Diazepam) .... Take 1 Tablet By Mouth Two Times A Day As Needed 5)  Clonidine Hcl 0.2 Mg Tabs (Clonidine Hcl) .... Take 1 Tablet By Mouth Two Times A Day 6)  Tylenol  Tabs (Acetaminophen Tabs) .... Use As Directed As Needed 7)  Klor-Con M20 20 Meq Tbcr (Potassium Chloride Crys Cr) .... Take 2 Tablets By Mouth Two Times A Day 8)  Xalatan 0.005 % Soln (Latanoprost) .... Use As Directed 9)  Lasix 40 Mg Tabs (Furosemide) .... Take 2  Tablets By Mouth in The Morning and One Table in The Afternoon. 10)  Norvasc 10 Mg  Tabs (Amlodipine Besylate) .... Take 1 Tablet By Mouth Once A Day 11)  Nexium 40 Mg  Cpdr (Esomeprazole Magnesium) .... Take One By Mouth Daily 12)  Warfarin Sodium 5 Mg Tabs (Warfarin Sodium) .... Take 1 Tablet By Mouth Once A Day 13)  Zantac 75 75 Mg Tabs (Ranitidine Hcl) .... Prn 14)  Dexamethasone 4 Mg Tabs (Dexamethasone) .... Take 1 Tablet By Mouth Once A Day For 3 Days and Stop. Take Only If Your Pain Is Severe.  Allergies (verified): No Known Drug Allergies  Past History:  Family History: Last updated: 05/18/2009 Father --CHD Mother --DM  Social History: Last updated: 05/18/2009 Has medicare. Lives alone at home.  Has 3 children in town which she is close to and is able to rely on if needed.  High school ducation. 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  Risk Factors: Alcohol Use: 0 (11/23/2009) Caffeine Use: 1 cup o f hot tea (11/23/2009) Exercise: no (11/23/2009)  Risk Factors: Smoking Status: quit (11/23/2009)  Past Medical History: Reviewed history from 01/05/2009 and no changes required. HTN A.fib on chronic coumadin therapy. Newly diagnosed Diastolic heart failure (Feb 8th,2010) Gout RA Mild chronic renal insuffiency Glaucoma s/p right cataract  surgery  Past Surgical History: Reviewed history from 05/18/2009 and no changes required. none  Family History: Reviewed history from 05/18/2009 and no changes required. Father --CHD Mother --DM  Social History: Reviewed history from 05/18/2009 and no changes required. Has medicare. Lives alone at home.  Has 3 children in town which she is close to and is able to rely on if needed.  High school ducation. 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  Review of Systems      See HPI  Physical Exam  General:  Well-developed,well-nourished,in no acute distress; alert,appropriate and cooperative throughout examination Mouth:  pharynx  pink and moist, no erythema, and no exudates.   Neck:  supple and no masses.   Lungs:  Normal respiratory effort, chest expands symmetrically. Lungs are clear to auscultation, no crackles or wheezes. Heart:  Normal rate and regular rhythm. S1 and S2 normal without gallop, murmur, click, rub or other extra sounds. Abdomen:  Bowel sounds positive,abdomen soft and non-tender without masses, organomegaly or hernias noted. Msk:  Joint deformity to both UE 1st MP joints with ulnar deviation of all digits. Buchard's nodes noted with worse on right hand. Decreased ROM with flexion to wrists and MP, PIP and DIP joints. Pulses:  R radial normal.   Extremities:  No clubbing, cyanosis, edema, or deformity noted with normal full range of motion of all joints.   Neurologic:  alert & oriented X3 and strength normal in all extremities.     Impression & Recommendations:  Problem # 1:  GOUTY TOPHI (ICD-274.82) Rheumatology referral was made but unofrtunately hasnt seen them yet. Will call her rheumatology office again and make another appointment sooner. As started by her ortho, will continue Dexamethasone 4 mg tablets as needed for her severe pains. Recommended her not take them every day. She is to use them for 2-3 days for severe pains. Her 20 tablets of dexamethasone  lasted about an year.   Her updated medication list for this problem includes:    Probenecid 500 Mg Tabs (Probenecid) .Marland Kitchen... Take 1 tablet by mouth two times a day    Colchicine 0.6 Mg Tabs (Colchicine) .Marland Kitchen... Take 1 tablet by two times a day  Problem # 2:  CHRONIC DIASTOLIC HEART FAILURE (ICD-428.32) No active issues. Cont current regimen.  Her updated medication list for this problem includes:    Metoprolol Tartrate 100 Mg Tabs (Metoprolol tartrate) .Marland Kitchen... Take 1 and 1/2  tablets by mouth two times a day    Lasix 40 Mg Tabs (Furosemide) .Marland Kitchen... Take 2  tablets by mouth in the morning and one table in the afternoon.    Warfarin Sodium 5 Mg Tabs (Warfarin sodium) .Marland Kitchen... Take 1 tablet by mouth once a day  Problem # 3:  ATRIAL FIBRILLATION (ICD-427.31) In sinus rhythm. Cont anticoagulation.  Her updated medication list for this problem includes:    Metoprolol Tartrate 100 Mg Tabs (Metoprolol tartrate) .Marland Kitchen... Take 1 and 1/2  tablets by mouth two times a day    Norvasc 10 Mg Tabs (Amlodipine besylate) .Marland Kitchen... Take 1 tablet by mouth once a day    Warfarin Sodium 5 Mg Tabs (Warfarin sodium) .Marland Kitchen... Take 1 tablet by mouth once a day  Problem # 4:  HYPERTENSION (ICD-401.9) well controlled.  Her updated medication list for this problem includes:    Metoprolol Tartrate 100 Mg Tabs (Metoprolol tartrate) .Marland Kitchen... Take 1 and 1/2  tablets by mouth two times a day    Clonidine Hcl 0.2 Mg Tabs (Clonidine hcl) .Marland Kitchen... Take 1 tablet by mouth two times a day    Lasix 40 Mg Tabs (Furosemide) .Marland Kitchen... Take 2  tablets by mouth in the morning and one table in the afternoon.    Norvasc 10 Mg Tabs (Amlodipine besylate) .Marland Kitchen... Take 1 tablet by mouth once a day  BP today: 130/60 Prior BP: 112/79 (06/22/2009)  Prior 10 Yr Risk Heart Disease: 8 % (05/18/2009)  Labs Reviewed: K+: 4.1 (06/22/2009) Creat: : 1.62 (06/22/2009)   Chol: 172 (04/06/2009)   HDL: 46 (04/06/2009)   LDL: 99 (04/06/2009)   TG: 134 (04/06/2009)  Complete  Medication List: 1)  Probenecid 500 Mg Tabs (Probenecid) .... Take 1 tablet by mouth two times a day 2)  Colchicine 0.6 Mg Tabs (Colchicine) .... Take 1 tablet by two times a day 3)  Metoprolol Tartrate 100 Mg Tabs (Metoprolol tartrate) .... Take 1 and 1/2  tablets by mouth two times a day 4)  Valium 5 Mg Tabs (Diazepam) .... Take 1 tablet by mouth two times a day as needed 5)  Clonidine Hcl 0.2 Mg Tabs (Clonidine hcl) .... Take 1 tablet by mouth two times a day 6)  Tylenol Tabs (Acetaminophen tabs) .... Use as directed as needed 7)  Klor-con M20 20 Meq Tbcr (Potassium chloride crys cr) .... Take 2 tablets by mouth two times a day 8)  Xalatan 0.005 % Soln (Latanoprost) .... Use as directed 9)  Lasix 40 Mg Tabs (Furosemide) .... Take 2  tablets by mouth in the morning and one table in the afternoon. 10)  Norvasc 10 Mg Tabs (Amlodipine besylate) .... Take 1 tablet by mouth once a day 11)  Nexium 40 Mg Cpdr (Esomeprazole magnesium) .... Take one by mouth daily 12)  Warfarin Sodium 5 Mg Tabs (Warfarin sodium) .... Take 1 tablet by mouth once a day 13)  Zantac 75 75 Mg Tabs (Ranitidine hcl) .... Prn 14)  Dexamethasone 4 Mg Tabs (Dexamethasone) .... Take 1 tablet by mouth once a day for 3 days and stop. take only if your pain is severe.  Patient Instructions: 1)  Please schedule a follow-up appointment in 3 months. 2)  Take all the medications as advised below. 3)  We will inform you about your appointment with rheumatologist. If you dont hear from [place] in a week, please give [place] a call. Prescriptions: DEXAMETHASONE 4 MG TABS (DEXAMETHASONE) Take 1 tablet by mouth once a day for 3 days and stop. Take only if your pain is severe.  #20 x 0   Entered and Authorized by:   [name]   Signed by:   [name] on 11/23/2009   Method used:   Electronically to        [facility] (retail)       [address]       [place], [place]  [id number]       Ph: [phone]        Fax: [phone]   RxID:   [id number]     Prevention & Chronic Care Immunizations   Influenza vaccine: Not documented   Influenza vaccine deferral: Refused  (04/20/2009)   Influenza vaccine due: 07/01/2009    Tetanus booster: Not documented   Td booster deferral: Refused  (04/20/2009)    Pneumococcal vaccine: Not documented   Pneumococcal vaccine deferral: Refused  (04/20/2009)    H. zoster vaccine: Not documented  Colorectal Screening   Hemoccult: Not documented   Hemoccult action/deferral: Refused  (04/20/2009)    Colonoscopy: Not documented   Colonoscopy action/deferral: Refused  (04/20/2009)  Other Screening   Pap smear: Not documented   Pap smear action/deferral: Refused  (04/20/2009)    Mammogram: Assessment: BIRADS 1.   (04/09/2008)   Mammogram action/deferral: Ordered  (04/20/2009)   Mammogram due: 03/2009    DXA bone density scan: Not documented   DXA bone density action/deferral: Not indicated  (04/20/2009)   Smoking status: quit  (11/23/2009)  Lipids   Total Cholesterol: 172  (04/06/2009)   LDL: 99  (04/06/2009)   LDL Direct: Not documented   HDL: 46  (04/06/2009)   Triglycerides: 134  (  04/06/2009)    SGOT (AST): 35  (07/24/2008)   SGPT (ALT): 23  (07/24/2008)   Alkaline phosphatase: 83  (07/24/2008)   Total bilirubin: 1.1  (07/24/2008)  Hypertension   Last Blood Pressure: 130 / 60  (11/23/2009)   Serum creatinine: 1.62  (06/22/2009)   Serum potassium 4.1  (06/22/2009)  Self-Management Support :    Patient will work on the following items until the next clinic visit to reach self-care goals:     Medications and monitoring: take my medicines every day  (11/23/2009)     Eating: eat more vegetables, eat foods that are low in salt, eat baked foods instead of fried foods  (11/23/2009)    Hypertension self-management support: Not documented    Lipid self-management support: Not documented

## 2010-11-30 NOTE — Progress Notes (Signed)
Summary: hosp disch/ hla  Phone Note From Other Clinic   Summary of Call: dr Algie Coffer calls and leaves this message:    adm for chest pain is being disch, will need f/u mon for INR, coumadin was stopped , neg stress test, has been placed on diltiazem 90mg  twice daily instead of norvasc, NTG added, O2 added for pul htn and pul fibrosis, will f/u w/ dr Algie Coffer in 2 wks.  if you have ?'s please call him at 60 [phone] i called and spoke to pt, offered her appt for f/u on 6/8 and she declined stated she will call for an appt when she feels better. Initial call taken by: Marin Roberts RN,  Mar 18, 2010 5:58 PM  Follow-up for Phone Call        We will see her at follow up. Follow-up by: [name],  Mar 19, 2010 10:49 AM

## 2010-11-30 NOTE — Progress Notes (Signed)
Summary: med change/gp  Phone Note Refill Request Message from:  Fax from Pharmacy on May 25, 2010 4:10 PM  Refills Requested: Medication #1:  ZANTAC 75 75 MG TABS PRN  Insurance  co. will not pay for Zantac 75mg  OTC. But will pay for 150mg  1 tab once daily as needed.  Please send new Rx.   Method Requested: Electronic Initial call taken by: Chinita Pester RN,  May 25, 2010 4:10 PM  Follow-up for Phone Call        Medication has been changed to zantac 150mg  daily.

## 2010-11-30 NOTE — Assessment & Plan Note (Signed)
Summary: COU/VS  Anticoagulant Therapy Managed by: [name]. Janie Morning  PharmD CACP PCP: [name] [facility] AttendingRogelia Boga MD, Elizabeth Indication 1: Atrial fibrillation Indication 2: Encounter for therapeutic drug monitoring  V58.83 Start date: 09/15/2007  Patient Assessment Reviewed by: [name] PharmD  November 23, 2009 Medication review: verified warfarin dosage & schedule,verified previous prescription medications, verified doses & any changes, verified new medications, reviewed OTC medications, reviewed OTC health products-vitamins supplements etc Complications: none Dietary changes: none   Health status changes: none   Lifestyle changes: none   Recent/future hospitalizations: none   Recent/future procedures: none   Recent/future dental: none Patient Assessment Part 2:  Have you MISSED ANY DOSES or CHANGED TABLETS?  No missed Warfarin doses or changed tablets.  Have you had any BRUISING or BLEEDING ( nose or gum bleeds,blood in urine or stool)?  No reported bruising or bleeding in nose, gums, urine, stool.  Have you STARTED or STOPPED any MEDICATIONS, including OTC meds,herbals or supplements?  No other medications or herbal supplements were started or stopped.  Have you CHANGED your DIET, especially green vegetables,or ALCOHOL intake?  No changes in diet or alcohol intake.  Have you had any ILLNESSES or HOSPITALIZATIONS?  No reported illnesses or hospitalizations  Have you had any signs of CLOTTING?(chest discomfort,dizziness,shortness of breath,arms tingling,slurred speech,swelling or redness in leg)    No chest discomfort, dizziness, shortness of breath, tingling in arm, slurred speech, swelling, or redness in leg.     Treatment  Target INR: 2.0-3.0 INR: 2.1  Date: 11/23/2009 Regimen In:  32.5mg /week INR reflects regimen in: 2.1  New  Tablet strength: : 5mg  Regimen Out:     Sunday: 1 Tablet     Monday: 1 Tablet     Tuesday: 1 Tablet     Wednesday:  1 Tablet     Thursday: 1 Tablet      Friday: 1 Tablet     Saturday: 1 Tablet Total Weekly: 35.0mg /week mg  Next INR Due: 12/21/2009 Adjusted by: [name]. [name] PharmD CACP   Return to anticoagulation clinic:  12/21/2009 Time of next visit: 0930    Allergies: No Known Drug Allergies

## 2010-12-02 NOTE — Assessment & Plan Note (Addendum)
Summary: COU/CH  Anticoagulant Therapy Managed by: [name]. Janie Morning  PharmD CACP PCP: [name] [facility] AttendingRogelia Boga MD, Elizabeth Indication 1: Atrial fibrillation Indication 2: Encounter for therapeutic drug monitoring  V58.83 Start date: 09/15/2007  Patient Assessment Reviewed by: [name] PharmD  November 08, 2010 Medication review: verified warfarin dosage & schedule,verified previous prescription medications, verified doses & any changes, verified new medications, reviewed OTC medications, reviewed OTC health products-vitamins supplements etc Complications: none Dietary changes: none   Health status changes: none   Lifestyle changes: none   Recent/future hospitalizations: none   Recent/future procedures: none   Recent/future dental: none Patient Assessment Part 2:  Have you MISSED ANY DOSES or CHANGED TABLETS?  No missed Warfarin doses or changed tablets.  Have you had any BRUISING or BLEEDING ( nose or gum bleeds,blood in urine or stool)?  No reported bruising or bleeding in nose, gums, urine, stool.  Have you STARTED or STOPPED any MEDICATIONS, including OTC meds,herbals or supplements?  No other medications or herbal supplements were started or stopped.  Have you CHANGED your DIET, especially green vegetables,or ALCOHOL intake?  No changes in diet or alcohol intake.  Have you had any ILLNESSES or HOSPITALIZATIONS?  No reported illnesses or hospitalizations  Have you had any signs of CLOTTING?(chest discomfort,dizziness,shortness of breath,arms tingling,slurred speech,swelling or redness in leg)    No chest discomfort, dizziness, shortness of breath, tingling in arm, slurred speech, swelling, or redness in leg.     Treatment  Target INR: 2.0-3.0 INR: 1.9  Date: 11/08/2010 Regimen In:  22.5mg /week INR reflects regimen in: 1.9  New  Tablet strength: : 5mg  Regimen Out:     Sunday: 0 Tablet     Monday: 1 Tablet     Tuesday: 1 Tablet     Wednesday:  1/2 Tablet     Thursday: 1 Tablet      Friday: 1 Tablet     Saturday: 1 Tablet Total Weekly: 27.5mg/week mg  Next INR Due: 11/29/2010 Adjusted by: Nyaira Hodgens B. Seddrick Flax [name] PharmD CACP   Return to anticoagulation clinic:  11/29/2010 Time of next visit: 1115    Allergies: 1)  ! * Pain Pills "just About All of Them" Prescriptions: WARFARIN SODIUM 5 MG TABS (WARFARIN SODIUM) Take NO tablets on Wednesdays and Saturdays, and 1/2 tablet on all other days of the week  #30 x 2   Entered by:   Jay Eudell Julian PharmD   Authorized by:   Elizabeth Butcher MD   Signed by:   Jay Raysean Graumann PharmD on 11/08/2010   Method used:   Electronically to        [facility] (retail)       [address]       [place], [place]  [id number]       Ph: [phone]       Fax: [phone]   RxID:   [id number]   Appended Document: COU/CH Will send NEW prescription to [facility] reflecting TAKE AS DIRECTED as the sig for her warfarin.

## 2010-12-06 ENCOUNTER — Ambulatory Visit (INDEPENDENT_AMBULATORY_CARE_PROVIDER_SITE_OTHER): Payer: MEDICARE | Admitting: Pharmacist

## 2010-12-06 DIAGNOSIS — Z7901 Long term (current) use of anticoagulants: Secondary | ICD-10-CM

## 2010-12-06 DIAGNOSIS — I6789 Other cerebrovascular disease: Secondary | ICD-10-CM

## 2010-12-06 DIAGNOSIS — I4891 Unspecified atrial fibrillation: Secondary | ICD-10-CM

## 2010-12-06 NOTE — Progress Notes (Signed)
Patient endorses continued weight loss that she did quantify. Otherwise doing well.

## 2010-12-06 NOTE — Patient Instructions (Signed)
Hold/Omit today's dose. Follow DoseResponse dosing instruction sheet provided.

## 2010-12-08 NOTE — Assessment & Plan Note (Signed)
Summary: COU/CH  Anticoagulant Therapy Managed by: [name]. Kim [name]  PharmD CACP PCP: [name] [facility] Attending: Lowella Bandy MD Indication 1: Atrial fibrillation Indication 2: Encounter for therapeutic drug monitoring  V58.83 Start date: 09/15/2007  Patient Assessment Reviewed by: [name] PharmD  November 30, 2010 Medication review: verified warfarin dosage & schedule,verified previous prescription medications, verified doses & any changes, verified new medications, reviewed OTC medications, reviewed OTC health products-vitamins supplements etc Complications: none Dietary changes: none   Health status changes: none   Lifestyle changes: none   Recent/future hospitalizations: none   Recent/future procedures: none   Recent/future dental: none Patient Assessment Part 2:  Have you MISSED ANY DOSES or CHANGED TABLETS?  No missed Warfarin doses or changed tablets.  Have you had any BRUISING or BLEEDING ( nose or gum bleeds,blood in urine or stool)?  No reported bruising or bleeding in nose, gums, urine, stool.  Have you STARTED or STOPPED any MEDICATIONS, including OTC meds,herbals or supplements?  No other medications or herbal supplements were started or stopped.  Have you CHANGED your DIET, especially green vegetables,or ALCOHOL intake?  No changes in diet or alcohol intake.  Have you had any ILLNESSES or HOSPITALIZATIONS?  No reported illnesses or hospitalizations  Have you had any signs of CLOTTING?(chest discomfort,dizziness,shortness of breath,arms tingling,slurred speech,swelling or redness in leg)    No chest discomfort, dizziness, shortness of breath, tingling in arm, slurred speech, swelling, or redness in leg.     Treatment  Target INR: 2.0-3.0 INR: 3.0  Date: 07/26/2010 Regimen In:  20mg /wk INR reflects regimen in: 3.0  New  Tablet strength: : 5mg  Regimen Out:     Sunday: 0 Tablet     Monday: 1 Tablet     Tuesday: 1/2 Tablet     Wednesday: 1  Tablet     Thursday: 0 Tablet      Friday: 1 Tablet     Saturday: 0 Tablet Total Weekly: 17.5mg /week mg  Next INR Due: 08/09/2010 Adjusted by: [name]. Kim [name] PharmD CACP   Return to anticoagulation clinic:  08/09/2010 Time of next visit: 1000    Allergies: 1)  ! * Pain Pills "just About All of Them"

## 2010-12-17 ENCOUNTER — Ambulatory Visit: Payer: Self-pay | Admitting: Internal Medicine

## 2010-12-20 ENCOUNTER — Ambulatory Visit: Payer: MEDICARE

## 2010-12-27 ENCOUNTER — Other Ambulatory Visit: Payer: Self-pay | Admitting: *Deleted

## 2010-12-27 MED ORDER — CLONIDINE HCL 0.2 MG PO TABS: 0.2000 mg | ORAL_TABLET | Freq: Two times a day (BID) | ORAL | Status: DC

## 2010-12-27 MED ORDER — COLCHICINE 0.6 MG PO TABS: 0.6000 mg | ORAL_TABLET | Freq: Two times a day (BID) | ORAL | Status: DC

## 2010-12-27 MED ORDER — FUROSEMIDE 40 MG PO TABS: 40.0000 mg | ORAL_TABLET | Freq: Two times a day (BID) | ORAL | Status: DC

## 2010-12-27 NOTE — Telephone Encounter (Signed)
Please schedule a follow up appointment.  Patient hasn't been seen by [facility] doctor since July 2011 as far as I can see.

## 2010-12-28 ENCOUNTER — Ambulatory Visit (INDEPENDENT_AMBULATORY_CARE_PROVIDER_SITE_OTHER): Payer: MEDICARE | Admitting: Internal Medicine

## 2010-12-28 ENCOUNTER — Encounter: Payer: Self-pay | Admitting: Internal Medicine

## 2010-12-28 DIAGNOSIS — N259 Disorder resulting from impaired renal tubular function, unspecified: Secondary | ICD-10-CM

## 2010-12-28 DIAGNOSIS — Z7901 Long term (current) use of anticoagulants: Secondary | ICD-10-CM

## 2010-12-28 DIAGNOSIS — M1A9XX1 Chronic gout, unspecified, with tophus (tophi): Secondary | ICD-10-CM

## 2010-12-28 DIAGNOSIS — R110 Nausea: Secondary | ICD-10-CM

## 2010-12-28 DIAGNOSIS — K219 Gastro-esophageal reflux disease without esophagitis: Secondary | ICD-10-CM

## 2010-12-28 DIAGNOSIS — I10 Essential (primary) hypertension: Secondary | ICD-10-CM

## 2010-12-28 DIAGNOSIS — M069 Rheumatoid arthritis, unspecified: Secondary | ICD-10-CM

## 2010-12-28 DIAGNOSIS — R634 Abnormal weight loss: Secondary | ICD-10-CM

## 2010-12-28 DIAGNOSIS — E079 Disorder of thyroid, unspecified: Secondary | ICD-10-CM

## 2010-12-28 DIAGNOSIS — E039 Hypothyroidism, unspecified: Secondary | ICD-10-CM

## 2010-12-28 DIAGNOSIS — E876 Hypokalemia: Secondary | ICD-10-CM

## 2010-12-28 LAB — CONVERTED CEMR LAB
ALT: 98 units/L — ABNORMAL HIGH (ref 0–35)
AST: 48 units/L — ABNORMAL HIGH (ref 0–37)
Albumin: 3.8 g/dL (ref 3.5–5.2)
Basophils Absolute: 0 10*3/uL (ref 0.0–0.1)
Bilirubin Urine: NEGATIVE
Blood, UA: NEGATIVE
CO2: 26 meq/L (ref 19–32)
Calcium: 9.8 mg/dL (ref 8.4–10.5)
Chloride: 100 meq/L (ref 96–112)
Creatinine, Ser: 1.75 mg/dL — ABNORMAL HIGH (ref 0.40–1.20)
Eosinophils Relative: 0 % (ref 0–5)
HCT: 46 % (ref 36.0–46.0)
INR: 2.7 — ABNORMAL HIGH (ref <1.50)
Ketones, ur: NEGATIVE mg/dL
Lymphocytes Relative: 8 % — ABNORMAL LOW (ref 12–46)
Lymphs Abs: 1 10*3/uL (ref 0.7–4.0)
Neutro Abs: 10.8 10*3/uL — ABNORMAL HIGH (ref 1.7–7.7)
Nitrite: NEGATIVE
Platelets: 360 10*3/uL (ref 150–400)
Potassium: 4.8 meq/L (ref 3.5–5.3)
Prothrombin Time: 28.8 s — ABNORMAL HIGH (ref 11.6–15.2)
RDW: 15.5 % (ref 11.5–15.5)
Rhuematoid fact SerPl-aCnc: <10 intl units/mL (ref <=14)
Sed Rate: 2 mm/hr (ref 0–22)
Sodium: 138 meq/L (ref 135–145)
Specific Gravity, Urine: 1.016 (ref 1.005–1.030)
Total Protein: 6.7 g/dL (ref 6.0–8.3)
Uric Acid, Serum: 13.1 mg/dL — ABNORMAL HIGH (ref 2.4–7.0)
Urine Glucose: NEGATIVE mg/dL
WBC: 12.8 10*3/uL — ABNORMAL HIGH (ref 4.0–10.5)
pH: 5.5 (ref 5.0–8.0)

## 2010-12-28 LAB — CBC WITH DIFFERENTIAL/PLATELET
Basophils Absolute: 0 10*3/uL (ref 0.0–0.1)
Basophils Relative: 0 % (ref 0–1)
Eosinophils Relative: 0 % (ref 0–5)
HCT: 46 % (ref 36.0–46.0)
MCHC: 34.3 g/dL (ref 30.0–36.0)
Monocytes Absolute: 0.9 10*3/uL (ref 0.1–1.0)
Neutro Abs: 10.8 10*3/uL — ABNORMAL HIGH (ref 1.7–7.7)
RDW: 15.5 % (ref 11.5–15.5)

## 2010-12-28 LAB — COMPREHENSIVE METABOLIC PANEL
ALT: 98 U/L — ABNORMAL HIGH (ref 0–35)
AST: 48 U/L — ABNORMAL HIGH (ref 0–37)
Calcium: 9.8 mg/dL (ref 8.4–10.5)
Chloride: 100 mEq/L (ref 96–112)
Creat: 1.75 mg/dL — ABNORMAL HIGH (ref 0.40–1.20)
Potassium: 4.8 mEq/L (ref 3.5–5.3)
Sodium: 138 mEq/L (ref 135–145)
Total Protein: 6.7 g/dL (ref 6.0–8.3)

## 2010-12-28 LAB — PROTIME-INR
INR: 2.7 — ABNORMAL HIGH (ref <1.50)
Prothrombin Time: 28.8 seconds — ABNORMAL HIGH (ref 11.6–15.2)

## 2010-12-28 LAB — TSH: TSH: 4.456 u[IU]/mL (ref 0.350–4.500)

## 2010-12-28 MED ORDER — LATANOPROST 0.005 % OP SOLN: 1.0000 [drp] | Freq: Every day | OPHTHALMIC | Status: DC

## 2010-12-28 MED ORDER — DILTIAZEM HCL 60 MG PO TABS: 60.0000 mg | ORAL_TABLET | Freq: Four times a day (QID) | ORAL | Status: DC

## 2010-12-28 MED ORDER — WARFARIN SODIUM 5 MG PO TABS: 5.0000 mg | ORAL_TABLET | ORAL | Status: DC

## 2010-12-28 MED ORDER — FUROSEMIDE 40 MG PO TABS: 40.0000 mg | ORAL_TABLET | Freq: Two times a day (BID) | ORAL | Status: DC

## 2010-12-28 NOTE — Progress Notes (Signed)
Addended byDonia Guiles on: 12/28/2010 12:18 PM   Modules accepted: Orders

## 2010-12-28 NOTE — Progress Notes (Addendum)
  Subjective:    Patient ID: Kim [name], female    DOB: Mar 24, 1929, 77 y.o.   MRN: [id number]  HPI    Review of Systems     Objective:   Physical Exam        Assessment & Plan:   It is also possible is is overdiuresed, and is experiencing either prerenal or infrarenal azotemia.

## 2010-12-28 NOTE — Progress Notes (Signed)
  Subjective:    Patient ID: Kim [name], female    DOB: [birth year], 77 y.o.   MRN: [id number]  HPI Pleasant woman with multiple medical problems as outlined-her history reviewed. Of note, she has hx of pulmonary fibrosis although plain films not significant and her pulmonary exam, O2 sats not abnormal She has diagnosis of both RA and gout, with single xray of hand and clinical history both strongly suggestive of gouty arthritis- Records of her RA not immediately to hand, but she is currently on no DMARDS. She notes indolent history of nausea and weight loss over last two to three months-review of her flow sheets confirm this, with an average weight of 160-165 lb now at 130 range. Says she feels distinct anorexia and does not want to fix food or eat, and has had reduced food intake during the time of her weight loss. No melena, abdominal pain, cramping, change in bowell habits. She has no clear flare in her joint sx's by her report, and no SOB, anginal equivilents, new neurologic sx's. Urinary habits the same. No flank pain or tenderness and no hematuria.  Has her medications with her and these reviewed.   Review of Systems per HPI    Objective:   Physical Exam  Frail-appearing woman in wheelchair, alert, oriented heent-no OP lesions or thrush No cervical LAD Lungs CTA-I detected no crackles at bases , wheezes or rales Car-RRR no M/R//G Abd Soft, NT NABS Ext-has large tphaceous deformity over left olecrenon-no other RA nodules Wrists with very limited flex/ext Bilateral ulanar deviation and subluxation of MTP's PIP's very roughed up, with nodularity-no large tophaceous dfeformities or erosions Grip strength fair bilateral Feet: has nodularity and irregularity of naviculars, overall foot architectuere preserved.       Assessment & Plan:   Frail woman with problems listed with anorexia and very significant weight loss (20-60 lb) Obviously this has a broad DDX- At this  stage, begin with the following concerns:  -Medication toxicity-given her age and frailty, colchicine, digoxin the leading candidates followed by clonidine (either generalized malaise +/- constipation) and potassium. (-I note she is on probenecid, which will need to be reviewed further, since I am not clear what studies were done prior to placing her on this)  Hypothyroidism, Hyperglycemia  Erosive gastritis   Flare in her RA  Occult.Marland KitchenMarland KitchenMailignancy, Heart Failure  Next steps:  Labs See orders)-will consider dose reductions and possibly discontinuing both digoxin and colchicine regardless of results. RTC in one week to review and plan next steps

## 2010-12-29 LAB — URINALYSIS
Bilirubin Urine: NEGATIVE
Ketones, ur: NEGATIVE mg/dL
Specific Gravity, Urine: 1.016 (ref 1.005–1.030)
pH: 5.5 (ref 5.0–8.0)

## 2011-01-04 ENCOUNTER — Ambulatory Visit: Payer: MEDICARE | Admitting: Internal Medicine

## 2011-01-17 ENCOUNTER — Ambulatory Visit (INDEPENDENT_AMBULATORY_CARE_PROVIDER_SITE_OTHER): Payer: MEDICARE | Admitting: Pharmacist

## 2011-01-17 DIAGNOSIS — I4891 Unspecified atrial fibrillation: Secondary | ICD-10-CM

## 2011-01-17 DIAGNOSIS — Z7901 Long term (current) use of anticoagulants: Secondary | ICD-10-CM

## 2011-01-17 DIAGNOSIS — I6789 Other cerebrovascular disease: Secondary | ICD-10-CM

## 2011-01-17 LAB — CARDIAC PANEL(CRET KIN+CKTOT+MB+TROPI)
CK, MB: 1.7 ng/mL (ref 0.3–4.0)
Relative Index: INVALID (ref 0.0–2.5)
Relative Index: INVALID (ref 0.0–2.5)
Total CK: 71 U/L (ref 7–177)
Total CK: 75 U/L (ref 7–177)
Total CK: 90 U/L (ref 7–177)
Troponin I: 0.02 ng/mL (ref 0.00–0.06)

## 2011-01-17 LAB — CBC
MCV: 86.6 fL (ref 78.0–100.0)
Platelets: 247 10*3/uL (ref 150–400)
RBC: 5.11 MIL/uL (ref 3.87–5.11)
WBC: 4.4 10*3/uL (ref 4.0–10.5)

## 2011-01-17 LAB — PROTIME-INR
INR: 4.32 — ABNORMAL HIGH (ref 0.00–1.49)
Prothrombin Time: 41.1 seconds — ABNORMAL HIGH (ref 11.6–15.2)

## 2011-01-17 LAB — COMPREHENSIVE METABOLIC PANEL
ALT: 35 U/L (ref 0–35)
AST: 60 U/L — ABNORMAL HIGH (ref 0–37)
CO2: 24 mEq/L (ref 19–32)
Calcium: 8.6 mg/dL (ref 8.4–10.5)
GFR calc Af Amer: 32 mL/min — ABNORMAL LOW (ref >60)
GFR calc non Af Amer: 27 mL/min — ABNORMAL LOW (ref >60)
Potassium: 3.4 mEq/L — ABNORMAL LOW (ref 3.5–5.1)
Sodium: 138 mEq/L (ref 135–145)
Total Protein: 7.2 g/dL (ref 6.0–8.3)

## 2011-01-17 LAB — LIPID PANEL
Cholesterol: 163 mg/dL (ref 0–200)
LDL Cholesterol: 73 mg/dL (ref 0–99)
Triglycerides: 264 mg/dL — ABNORMAL HIGH (ref <150)

## 2011-01-17 LAB — DIFFERENTIAL
Eosinophils Absolute: 0.1 10*3/uL (ref 0.0–0.7)
Eosinophils Relative: 3 % (ref 0–5)
Lymphs Abs: 1.2 10*3/uL (ref 0.7–4.0)
Monocytes Relative: 12 % (ref 3–12)

## 2011-01-17 NOTE — Patient Instructions (Signed)
Patient instructed to take medications as defined in the Anti-coagulation Track section of this encounter.  Patient instructed to OMIT today's dose.  Patient verbalized understanding of these instructions.    

## 2011-01-17 NOTE — Progress Notes (Signed)
Anti-Coagulation Progress Note  Alethia L Cedotal is a 77 y.o. female who is currently on an anti-coagulation regimen.    RECENT RESULTS: Recent results are below, the most recent result is correlated with a dose of 22.5 mg. per week: Lab Results  Component Value Date   INR 4.1 01/17/2011   INR 2.70* 12/28/2010   INR 2.70* 12/28/2010    ANTI-COAG DOSE:   Latest dosing instructions   Total Sun Mon Tue Wed Thu Fri Sat   17.5 2.5 mg 2.5 mg 2.5 mg 2.5 mg 2.5 mg 2.5 mg 2.5 mg    (5 mg0.5) (5 mg0.5) (5 mg0.5) (5 mg0.5) (5 mg0.5) (5 mg0.5) (5 mg0.5)         ANTICOAG SUMMARY: Anticoagulation Episode Summary              Current INR goal 2.0-3.0 Next INR check 01/31/2011   INR from last check 4.1! (01/17/2011)     Weekly max dose (mg)  Target end date Indefinite   Indications ATRIAL FIBRILLATION, CEREBELLAR INFARCT, Long term current use of anticoagulant   [facility]    Send INR reminders to [facility]   Comments        Provider Role Specialty Phone number   Blanch Media  Internal Medicine [phone]        ANTICOAG TODAY: Anticoagulation Summary as of 01/17/2011              INR goal 2.0-3.0     Selected INR 4.1! (01/17/2011) Next INR check 01/31/2011   Weekly max dose (mg)  Target end date Indefinite   Indications ATRIAL FIBRILLATION, CEREBELLAR INFARCT, Long term current use of anticoagulant    Anticoagulation Episode Summary              INR check location [facility] Preferred lab    Send INR reminders to [facility]   Comments        Provider Role Specialty Phone number   Blanch Media  Internal Medicine [phone]        PATIENT INSTRUCTIONS: Patient Instructions  Patient instructed to take medications as defined in the Anti-coagulation Track section of this encounter.  Patient instructed to OMIT today's dose.  Patient verbalized understanding of these instructions.        FOLLOW-UP Return in 2  weeks (on 01/31/2011).  [name], [name] Pharm.D., CACP

## 2011-01-19 LAB — PROTIME-INR
INR: 2.03 — ABNORMAL HIGH (ref 0.00–1.49)
INR: 2.19 — ABNORMAL HIGH (ref 0.00–1.49)
Prothrombin Time: 24 seconds — ABNORMAL HIGH (ref 11.6–15.2)

## 2011-01-19 LAB — BRAIN NATRIURETIC PEPTIDE
Pro B Natriuretic peptide (BNP): 641 pg/mL — ABNORMAL HIGH (ref 0.0–100.0)
Pro B Natriuretic peptide (BNP): 716 pg/mL — ABNORMAL HIGH (ref 0.0–100.0)

## 2011-01-19 LAB — CBC
HCT: 30.6 % — ABNORMAL LOW (ref 36.0–46.0)
HCT: 30.6 % — ABNORMAL LOW (ref 36.0–46.0)
HCT: 32.1 % — ABNORMAL LOW (ref 36.0–46.0)
HCT: 32.4 % — ABNORMAL LOW (ref 36.0–46.0)
Hemoglobin: 10.4 g/dL — ABNORMAL LOW (ref 12.0–15.0)
Hemoglobin: 10.6 g/dL — ABNORMAL LOW (ref 12.0–15.0)
Hemoglobin: 10.6 g/dL — ABNORMAL LOW (ref 12.0–15.0)
Hemoglobin: 11 g/dL — ABNORMAL LOW (ref 12.0–15.0)
MCHC: 33.8 g/dL (ref 30.0–36.0)
MCHC: 34.2 g/dL (ref 30.0–36.0)
MCV: 92.5 fL (ref 78.0–100.0)
MCV: 92.7 fL (ref 78.0–100.0)
MCV: 92.8 fL (ref 78.0–100.0)
Platelets: 250 10*3/uL (ref 150–400)
RBC: 3.3 MIL/uL — ABNORMAL LOW (ref 3.87–5.11)
RBC: 3.35 MIL/uL — ABNORMAL LOW (ref 3.87–5.11)
RBC: 3.35 MIL/uL — ABNORMAL LOW (ref 3.87–5.11)
RBC: 3.49 MIL/uL — ABNORMAL LOW (ref 3.87–5.11)
RDW: 17.7 % — ABNORMAL HIGH (ref 11.5–15.5)
RDW: 17.8 % — ABNORMAL HIGH (ref 11.5–15.5)
RDW: 18 % — ABNORMAL HIGH (ref 11.5–15.5)
WBC: 11.1 10*3/uL — ABNORMAL HIGH (ref 4.0–10.5)
WBC: 6.6 10*3/uL (ref 4.0–10.5)

## 2011-01-19 LAB — BLOOD GAS, ARTERIAL
Acid-Base Excess: 1.6 mmol/L (ref 0.0–2.0)
Drawn by: 246861
O2 Content: 2 L/min
O2 Content: 2.5 L/min
O2 Saturation: 99.3 %
TCO2: 25.9 mmol/L (ref 0–100)
pCO2 arterial: 31 mmHg — ABNORMAL LOW (ref 35.0–45.0)
pH, Arterial: 7.469 — ABNORMAL HIGH (ref 7.350–7.400)
pO2, Arterial: 110 mmHg — ABNORMAL HIGH (ref 80.0–100.0)
pO2, Arterial: 47.3 mmHg — ABNORMAL LOW (ref 80.0–100.0)

## 2011-01-19 LAB — BASIC METABOLIC PANEL
BUN: 15 mg/dL (ref 6–23)
BUN: 21 mg/dL (ref 6–23)
CO2: 26 mEq/L (ref 19–32)
CO2: 27 mEq/L (ref 19–32)
Calcium: 8.5 mg/dL (ref 8.4–10.5)
Calcium: 8.7 mg/dL (ref 8.4–10.5)
Chloride: 102 mEq/L (ref 96–112)
Chloride: 106 mEq/L (ref 96–112)
Chloride: 106 mEq/L (ref 96–112)
Creatinine, Ser: 1.24 mg/dL — ABNORMAL HIGH (ref 0.4–1.2)
GFR calc Af Amer: 41 mL/min — ABNORMAL LOW (ref >60)
GFR calc Af Amer: 43 mL/min — ABNORMAL LOW (ref >60)
GFR calc Af Amer: 50 mL/min — ABNORMAL LOW (ref >60)
GFR calc non Af Amer: 35 mL/min — ABNORMAL LOW (ref >60)
GFR calc non Af Amer: 35 mL/min — ABNORMAL LOW (ref >60)
Glucose, Bld: 102 mg/dL — ABNORMAL HIGH (ref 70–99)
Glucose, Bld: 114 mg/dL — ABNORMAL HIGH (ref 70–99)
Glucose, Bld: 133 mg/dL — ABNORMAL HIGH (ref 70–99)
Glucose, Bld: 92 mg/dL (ref 70–99)
Potassium: 3.4 mEq/L — ABNORMAL LOW (ref 3.5–5.1)
Potassium: 3.6 mEq/L (ref 3.5–5.1)
Potassium: 3.7 mEq/L (ref 3.5–5.1)
Potassium: 4.4 mEq/L (ref 3.5–5.1)
Sodium: 138 mEq/L (ref 135–145)
Sodium: 138 mEq/L (ref 135–145)
Sodium: 140 mEq/L (ref 135–145)
Sodium: 141 mEq/L (ref 135–145)

## 2011-01-19 LAB — URINALYSIS, ROUTINE W REFLEX MICROSCOPIC
Bilirubin Urine: NEGATIVE
Ketones, ur: NEGATIVE mg/dL
Nitrite: NEGATIVE
Urobilinogen, UA: 0.2 mg/dL (ref 0.0–1.0)
pH: 5 (ref 5.0–8.0)

## 2011-01-19 LAB — URINE MICROSCOPIC-ADD ON

## 2011-01-19 LAB — MRSA PCR SCREENING: MRSA by PCR: NEGATIVE

## 2011-01-20 NOTE — Telephone Encounter (Signed)
Please schedule an appt, thank you.

## 2011-01-24 LAB — BASIC METABOLIC PANEL
BUN: 11 mg/dL (ref 6–23)
CO2: 25 mEq/L (ref 19–32)
Calcium: 7.9 mg/dL — ABNORMAL LOW (ref 8.4–10.5)
Calcium: 8.3 mg/dL — ABNORMAL LOW (ref 8.4–10.5)
Chloride: 99 mEq/L (ref 96–112)
Creatinine, Ser: 1.24 mg/dL — ABNORMAL HIGH (ref 0.4–1.2)
Creatinine, Ser: 1.63 mg/dL — ABNORMAL HIGH (ref 0.4–1.2)
GFR calc Af Amer: 37 mL/min — ABNORMAL LOW (ref >60)
GFR calc Af Amer: 51 mL/min — ABNORMAL LOW (ref >60)
GFR calc non Af Amer: 42 mL/min — ABNORMAL LOW (ref >60)
GFR calc non Af Amer: 42 mL/min — ABNORMAL LOW (ref >60)
Glucose, Bld: 161 mg/dL — ABNORMAL HIGH (ref 70–99)
Potassium: 3.1 mEq/L — ABNORMAL LOW (ref 3.5–5.1)
Potassium: 4.4 mEq/L (ref 3.5–5.1)
Potassium: 4.8 mEq/L (ref 3.5–5.1)
Sodium: 137 mEq/L (ref 135–145)
Sodium: 140 mEq/L (ref 135–145)

## 2011-01-24 LAB — COMPREHENSIVE METABOLIC PANEL
ALT: 24 U/L (ref 0–35)
AST: 39 U/L — ABNORMAL HIGH (ref 0–37)
CO2: 19 mEq/L (ref 19–32)
Calcium: 7.5 mg/dL — ABNORMAL LOW (ref 8.4–10.5)
GFR calc Af Amer: 41 mL/min — ABNORMAL LOW (ref >60)
GFR calc non Af Amer: 34 mL/min — ABNORMAL LOW (ref >60)
Potassium: 3.2 mEq/L — ABNORMAL LOW (ref 3.5–5.1)
Sodium: 136 mEq/L (ref 135–145)
Total Protein: 6.7 g/dL (ref 6.0–8.3)

## 2011-01-24 LAB — CARDIAC PANEL(CRET KIN+CKTOT+MB+TROPI)
CK, MB: 1.3 ng/mL (ref 0.3–4.0)
Relative Index: 0.6 (ref 0.0–2.5)
Total CK: 205 U/L — ABNORMAL HIGH (ref 7–177)
Total CK: 209 U/L — ABNORMAL HIGH (ref 7–177)
Troponin I: 0.01 ng/mL (ref 0.00–0.06)
Troponin I: 0.01 ng/mL (ref 0.00–0.06)

## 2011-01-24 LAB — PROTIME-INR
INR: 1.77 — ABNORMAL HIGH (ref 0.00–1.49)
INR: 5.21 (ref 0.00–1.49)
INR: 9.57 (ref 0.00–1.49)
Prothrombin Time: 76.5 seconds — ABNORMAL HIGH (ref 11.6–15.2)

## 2011-01-24 LAB — GIARDIA/CRYPTOSPORIDIUM SCREEN(EIA): Cryptosporidium Screen (EIA): NEGATIVE

## 2011-01-24 LAB — HEPATIC FUNCTION PANEL
ALT: 26 U/L (ref 0–35)
AST: 47 U/L — ABNORMAL HIGH (ref 0–37)
Bilirubin, Direct: 0.4 mg/dL — ABNORMAL HIGH (ref 0.0–0.3)
Indirect Bilirubin: 1.3 mg/dL — ABNORMAL HIGH (ref 0.3–0.9)
Total Bilirubin: 1.7 mg/dL — ABNORMAL HIGH (ref 0.3–1.2)

## 2011-01-24 LAB — POCT I-STAT, CHEM 8
BUN: 21 mg/dL (ref 6–23)
Calcium, Ion: 0.97 mmol/L — ABNORMAL LOW (ref 1.12–1.32)
Chloride: 107 mEq/L (ref 96–112)
HCT: 41 % (ref 36.0–46.0)
Potassium: 3.6 mEq/L (ref 3.5–5.1)
Sodium: 139 mEq/L (ref 135–145)

## 2011-01-24 LAB — CBC
HCT: 29.9 % — ABNORMAL LOW (ref 36.0–46.0)
HCT: 39.5 % (ref 36.0–46.0)
Hemoglobin: 13.5 g/dL (ref 12.0–15.0)
Hemoglobin: 15.6 g/dL — ABNORMAL HIGH (ref 12.0–15.0)
MCHC: 34.1 g/dL (ref 30.0–36.0)
MCHC: 34.8 g/dL (ref 30.0–36.0)
MCV: 90.3 fL (ref 78.0–100.0)
Platelets: 245 10*3/uL (ref 150–400)
Platelets: 266 10*3/uL (ref 150–400)
RBC: 3.52 MIL/uL — ABNORMAL LOW (ref 3.87–5.11)
RBC: 4.35 MIL/uL (ref 3.87–5.11)
RBC: 4.95 MIL/uL (ref 3.87–5.11)
RDW: 17.3 % — ABNORMAL HIGH (ref 11.5–15.5)
RDW: 17.5 % — ABNORMAL HIGH (ref 11.5–15.5)
WBC: 5.8 10*3/uL (ref 4.0–10.5)
WBC: 6.4 10*3/uL (ref 4.0–10.5)

## 2011-01-24 LAB — RAPID URINE DRUG SCREEN, HOSP PERFORMED
Barbiturates: NOT DETECTED
Opiates: NOT DETECTED

## 2011-01-24 LAB — LACTIC ACID, PLASMA
Lactic Acid, Venous: 2.5 mmol/L — ABNORMAL HIGH (ref 0.5–2.2)
Lactic Acid, Venous: 4.4 mmol/L — ABNORMAL HIGH (ref 0.5–2.2)

## 2011-01-24 LAB — CULTURE, BLOOD (ROUTINE X 2)
Culture: NO GROWTH
Culture: NO GROWTH

## 2011-01-24 LAB — URINALYSIS, ROUTINE W REFLEX MICROSCOPIC
Glucose, UA: NEGATIVE mg/dL
Ketones, ur: NEGATIVE mg/dL
Nitrite: NEGATIVE
pH: 5 (ref 5.0–8.0)

## 2011-01-24 LAB — URINE MICROSCOPIC-ADD ON

## 2011-01-24 LAB — DIFFERENTIAL
Basophils Relative: 0 % (ref 0–1)
Eosinophils Absolute: 0 10*3/uL (ref 0.0–0.7)
Eosinophils Relative: 0 % (ref 0–5)
Lymphocytes Relative: 21 % (ref 12–46)
Lymphs Abs: 0.5 10*3/uL — ABNORMAL LOW (ref 0.7–4.0)
Monocytes Absolute: 0.5 10*3/uL (ref 0.1–1.0)
Monocytes Absolute: 1.1 10*3/uL — ABNORMAL HIGH (ref 0.1–1.0)
Monocytes Relative: 16 % — ABNORMAL HIGH (ref 3–12)
Monocytes Relative: 8 % (ref 3–12)
Neutro Abs: 4.3 10*3/uL (ref 1.7–7.7)
Neutrophils Relative %: 85 % — ABNORMAL HIGH (ref 43–77)

## 2011-01-24 LAB — URINE CULTURE: Colony Count: NO GROWTH

## 2011-01-24 LAB — POCT CARDIAC MARKERS
CKMB, poc: <1 ng/mL — ABNORMAL LOW (ref 1.0–8.0)
Myoglobin, poc: 187 ng/mL (ref 12–200)
Myoglobin, poc: 206 ng/mL (ref 12–200)
Troponin i, poc: <0.05 ng/mL (ref 0.00–0.09)

## 2011-01-24 LAB — MAGNESIUM: Magnesium: 2 mg/dL (ref 1.5–2.5)

## 2011-01-24 LAB — CK TOTAL AND CKMB (NOT AT ARMC): Total CK: 196 U/L — ABNORMAL HIGH (ref 7–177)

## 2011-01-24 LAB — IRON AND TIBC: TIBC: 267 ug/dL (ref 250–470)

## 2011-01-24 LAB — APTT
aPTT: 124 seconds — ABNORMAL HIGH (ref 24–37)
aPTT: 32 seconds (ref 24–37)

## 2011-01-24 LAB — FOLATE: Folate: 8.5 ng/mL

## 2011-01-24 LAB — CLOSTRIDIUM DIFFICILE EIA: C difficile Toxins A+B, EIA: NEGATIVE

## 2011-01-24 LAB — RETICULOCYTES
Retic Count, Absolute: 178.2 10*3/uL (ref 19.0–186.0)
Retic Ct Pct: 5.4 % — ABNORMAL HIGH (ref 0.4–3.1)

## 2011-01-31 ENCOUNTER — Encounter: Payer: Self-pay | Admitting: Pharmacist

## 2011-01-31 ENCOUNTER — Ambulatory Visit (INDEPENDENT_AMBULATORY_CARE_PROVIDER_SITE_OTHER): Payer: MEDICARE | Admitting: Pharmacist

## 2011-01-31 DIAGNOSIS — I6789 Other cerebrovascular disease: Secondary | ICD-10-CM

## 2011-01-31 DIAGNOSIS — I4891 Unspecified atrial fibrillation: Secondary | ICD-10-CM

## 2011-01-31 DIAGNOSIS — Z7901 Long term (current) use of anticoagulants: Secondary | ICD-10-CM

## 2011-01-31 LAB — POCT INR: INR: 4

## 2011-01-31 NOTE — Progress Notes (Signed)
Will OMIT today's dose. Decrease weekly dose from 17.5mg /wk warfarin to 15mg /wk warfarin and RTC in 2 weeks at steady-state on new regimen.

## 2011-01-31 NOTE — Patient Instructions (Signed)
Patient instructed to take medications as defined in the Anti-coagulation Track section of this encounter.  Patient instructed to OMIT today's dose.  Patient verbalized understanding of these instructions.    

## 2011-02-07 LAB — GLUCOSE, CAPILLARY: Glucose-Capillary: 122 mg/dL — ABNORMAL HIGH (ref 70–99)

## 2011-02-07 LAB — URINE MICROSCOPIC-ADD ON

## 2011-02-07 LAB — COMPREHENSIVE METABOLIC PANEL
ALT: 24 U/L (ref 0–35)
AST: 30 U/L (ref 0–37)
CO2: 24 mEq/L (ref 19–32)
Chloride: 111 mEq/L (ref 96–112)
GFR calc Af Amer: 36 mL/min — ABNORMAL LOW (ref >60)
GFR calc non Af Amer: 30 mL/min — ABNORMAL LOW (ref >60)
Sodium: 147 mEq/L — ABNORMAL HIGH (ref 135–145)
Total Bilirubin: 1.4 mg/dL — ABNORMAL HIGH (ref 0.3–1.2)

## 2011-02-07 LAB — CBC
Hemoglobin: 13.4 g/dL (ref 12.0–15.0)
MCHC: 34.4 g/dL (ref 30.0–36.0)
MCHC: 35.4 g/dL (ref 30.0–36.0)
MCV: 90.9 fL (ref 78.0–100.0)
MCV: 90.9 fL (ref 78.0–100.0)
Platelets: 155 10*3/uL (ref 150–400)
Platelets: 160 10*3/uL (ref 150–400)
RBC: 4.27 MIL/uL (ref 3.87–5.11)
WBC: 4.8 10*3/uL (ref 4.0–10.5)

## 2011-02-07 LAB — URINE CULTURE: Colony Count: 6000

## 2011-02-07 LAB — URINALYSIS, ROUTINE W REFLEX MICROSCOPIC
Nitrite: NEGATIVE
Specific Gravity, Urine: 1.01 (ref 1.005–1.030)
Urobilinogen, UA: 0.2 mg/dL (ref 0.0–1.0)

## 2011-02-07 LAB — BASIC METABOLIC PANEL
BUN: 18 mg/dL (ref 6–23)
BUN: 23 mg/dL (ref 6–23)
Chloride: 105 mEq/L (ref 96–112)
Chloride: 111 mEq/L (ref 96–112)
Creatinine, Ser: 1.79 mg/dL — ABNORMAL HIGH (ref 0.4–1.2)
GFR calc non Af Amer: 27 mL/min — ABNORMAL LOW (ref >60)
Glucose, Bld: 106 mg/dL — ABNORMAL HIGH (ref 70–99)
Potassium: 3 mEq/L — ABNORMAL LOW (ref 3.5–5.1)
Sodium: 144 mEq/L (ref 135–145)

## 2011-02-07 LAB — LIPID PANEL
HDL: 46 mg/dL (ref >39)
Total CHOL/HDL Ratio: 3.7 RATIO
Triglycerides: 134 mg/dL (ref <150)

## 2011-02-07 LAB — POCT I-STAT, CHEM 8
Calcium, Ion: 1.06 mmol/L — ABNORMAL LOW (ref 1.12–1.32)
Glucose, Bld: 110 mg/dL — ABNORMAL HIGH (ref 70–99)
HCT: 40 % (ref 36.0–46.0)
Hemoglobin: 13.6 g/dL (ref 12.0–15.0)
Potassium: 3 mEq/L — ABNORMAL LOW (ref 3.5–5.1)
TCO2: 25 mmol/L (ref 0–100)

## 2011-02-07 LAB — DIFFERENTIAL
Basophils Relative: 1 % (ref 0–1)
Lymphs Abs: 0.7 10*3/uL (ref 0.7–4.0)
Monocytes Absolute: 0.6 10*3/uL (ref 0.1–1.0)
Monocytes Relative: 9 % (ref 3–12)
Neutro Abs: 5.4 10*3/uL (ref 1.7–7.7)

## 2011-02-07 LAB — PROTIME-INR
INR: 4.9 — ABNORMAL HIGH (ref 0.00–1.49)
Prothrombin Time: 44.1 seconds — ABNORMAL HIGH (ref 11.6–15.2)
Prothrombin Time: 71.4 seconds — ABNORMAL HIGH (ref 11.6–15.2)

## 2011-02-07 LAB — POCT I-STAT 3, ART BLOOD GAS (G3+): pH, Arterial: 7.472 — ABNORMAL HIGH (ref 7.350–7.400)

## 2011-02-07 LAB — TSH: TSH: 4.434 u[IU]/mL (ref 0.350–4.500)

## 2011-02-07 LAB — APTT: aPTT: 80 seconds — ABNORMAL HIGH (ref 24–37)

## 2011-02-07 LAB — LEGIONELLA ANTIGEN, URINE: Legionella Antigen, Urine: NEGATIVE

## 2011-02-07 LAB — CARDIAC PANEL(CRET KIN+CKTOT+MB+TROPI): Total CK: 87 U/L (ref 7–177)

## 2011-02-07 LAB — STREP PNEUMONIAE URINARY ANTIGEN: Strep Pneumo Urinary Antigen: NEGATIVE

## 2011-02-07 LAB — CULTURE, BLOOD (ROUTINE X 2): Culture: NO GROWTH

## 2011-02-14 ENCOUNTER — Ambulatory Visit: Payer: MEDICARE

## 2011-02-15 LAB — COMPREHENSIVE METABOLIC PANEL
Albumin: 2.8 g/dL — ABNORMAL LOW (ref 3.5–5.2)
Alkaline Phosphatase: 96 U/L (ref 39–117)
BUN: 14 mg/dL (ref 6–23)
Calcium: 9 mg/dL (ref 8.4–10.5)
Glucose, Bld: 85 mg/dL (ref 70–99)
Potassium: 3 mEq/L — ABNORMAL LOW (ref 3.5–5.1)
Sodium: 138 mEq/L (ref 135–145)
Total Protein: 6.8 g/dL (ref 6.0–8.3)

## 2011-02-15 LAB — URINALYSIS, ROUTINE W REFLEX MICROSCOPIC
Glucose, UA: NEGATIVE mg/dL
Hgb urine dipstick: NEGATIVE
Ketones, ur: NEGATIVE mg/dL
Nitrite: NEGATIVE
pH: 5.5 (ref 5.0–8.0)

## 2011-02-15 LAB — CBC
HCT: 37.9 % (ref 36.0–46.0)
HCT: 38.3 % (ref 36.0–46.0)
Hemoglobin: 13.4 g/dL (ref 12.0–15.0)
MCHC: 35.1 g/dL (ref 30.0–36.0)
MCV: 91 fL (ref 78.0–100.0)
MCV: 92.1 fL (ref 78.0–100.0)
Platelets: 231 10*3/uL (ref 150–400)
RDW: 15.9 % — ABNORMAL HIGH (ref 11.5–15.5)
WBC: 4.9 10*3/uL (ref 4.0–10.5)

## 2011-02-15 LAB — PROTIME-INR
INR: 2.4 — ABNORMAL HIGH (ref 0.00–1.49)
Prothrombin Time: 23.4 seconds — ABNORMAL HIGH (ref 11.6–15.2)
Prothrombin Time: 27.8 seconds — ABNORMAL HIGH (ref 11.6–15.2)

## 2011-02-15 LAB — RAPID URINE DRUG SCREEN, HOSP PERFORMED
Amphetamines: NOT DETECTED
Barbiturates: POSITIVE — AB
Opiates: NOT DETECTED
Opiates: NOT DETECTED
Tetrahydrocannabinol: NOT DETECTED

## 2011-02-15 LAB — TROPONIN I: Troponin I: 0.01 ng/mL (ref 0.00–0.06)

## 2011-02-15 LAB — POCT I-STAT, CHEM 8
BUN: 15 mg/dL (ref 6–23)
Creatinine, Ser: 1.8 mg/dL — ABNORMAL HIGH (ref 0.4–1.2)
Glucose, Bld: 101 mg/dL — ABNORMAL HIGH (ref 70–99)
Hemoglobin: 13.6 g/dL (ref 12.0–15.0)
TCO2: 24 mmol/L (ref 0–100)

## 2011-02-15 LAB — CARDIAC PANEL(CRET KIN+CKTOT+MB+TROPI)
CK, MB: 1.4 ng/mL (ref 0.3–4.0)
Total CK: 100 U/L (ref 7–177)
Troponin I: 0.01 ng/mL (ref 0.00–0.06)

## 2011-02-15 LAB — POCT CARDIAC MARKERS: CKMB, poc: <1 ng/mL — ABNORMAL LOW (ref 1.0–8.0)

## 2011-02-15 LAB — DIFFERENTIAL
Basophils Absolute: 0 10*3/uL (ref 0.0–0.1)
Basophils Relative: 0 % (ref 0–1)
Eosinophils Relative: 1 % (ref 0–5)
Monocytes Absolute: 0.5 10*3/uL (ref 0.1–1.0)
Monocytes Relative: 10 % (ref 3–12)

## 2011-02-15 LAB — URINE MICROSCOPIC-ADD ON

## 2011-02-15 LAB — BASIC METABOLIC PANEL
BUN: 12 mg/dL (ref 6–23)
Chloride: 102 mEq/L (ref 96–112)
Glucose, Bld: 119 mg/dL — ABNORMAL HIGH (ref 70–99)
Potassium: 3.3 mEq/L — ABNORMAL LOW (ref 3.5–5.1)

## 2011-02-15 LAB — CK TOTAL AND CKMB (NOT AT ARMC)
CK, MB: 1.5 ng/mL (ref 0.3–4.0)
Relative Index: 1.3 (ref 0.0–2.5)
Total CK: 113 U/L (ref 7–177)

## 2011-02-15 LAB — BRAIN NATRIURETIC PEPTIDE: Pro B Natriuretic peptide (BNP): 558 pg/mL — ABNORMAL HIGH (ref 0.0–100.0)

## 2011-02-15 LAB — MAGNESIUM: Magnesium: 1.8 mg/dL (ref 1.5–2.5)

## 2011-02-16 ENCOUNTER — Telehealth: Payer: Self-pay | Admitting: *Deleted

## 2011-02-16 NOTE — Telephone Encounter (Signed)
  She was to have followed up in one week-I do not think she was given an appointment up front, which is why the confusion I do need to see her within the next several days, if you could schedule a visit for her, thx (let me know)

## 2011-02-16 NOTE — Telephone Encounter (Signed)
I tried to call that number-no answer, no answ machine to leave msg  Will try again  If she calls: Labs show she may have gout, but her tests for rheumatoid arthritis are negative. Her digoxin level is on the high side, (still normal) and I'd like to have her come in to go over dosing of that  DT

## 2011-02-16 NOTE — Telephone Encounter (Signed)
Pt given appointment for f/u with Dr Reche Dixon.

## 2011-02-16 NOTE — Telephone Encounter (Signed)
Pt called requesting  Lab results from 2/28 visit. She has been expecting a call to review her results.  Can you call her at [phone] with results? Pt was seen by Dr Reche Dixon

## 2011-02-17 ENCOUNTER — Encounter: Payer: Self-pay | Admitting: Ophthalmology

## 2011-02-21 ENCOUNTER — Ambulatory Visit (INDEPENDENT_AMBULATORY_CARE_PROVIDER_SITE_OTHER): Payer: MEDICARE | Admitting: Pharmacist

## 2011-02-21 ENCOUNTER — Ambulatory Visit (INDEPENDENT_AMBULATORY_CARE_PROVIDER_SITE_OTHER): Payer: MEDICARE | Admitting: Internal Medicine

## 2011-02-21 ENCOUNTER — Encounter: Payer: Self-pay | Admitting: Internal Medicine

## 2011-02-21 VITALS — BP 108/72 | HR 98 | Temp 97.5°F | Ht 64.0 in | Wt 136.3 lb

## 2011-02-21 DIAGNOSIS — I6789 Other cerebrovascular disease: Secondary | ICD-10-CM

## 2011-02-21 DIAGNOSIS — I4891 Unspecified atrial fibrillation: Secondary | ICD-10-CM

## 2011-02-21 DIAGNOSIS — R634 Abnormal weight loss: Secondary | ICD-10-CM

## 2011-02-21 DIAGNOSIS — Z7901 Long term (current) use of anticoagulants: Secondary | ICD-10-CM

## 2011-02-21 NOTE — Patient Instructions (Signed)
Patient instructed to take medications as defined in the Anti-coagulation Track section of this encounter.  Patient instructed to OMIT today's dose and TOMORROW's DOSE.  Patient verbalized understanding of these instructions.

## 2011-02-21 NOTE — Progress Notes (Signed)
Anti-Coagulation Progress Note  Kim [name] is a 77 y.o. female who is currently on an anti-coagulation regimen.    RECENT RESULTS: Recent results are below, the most recent result is correlated with a dose of 15 mg. per week: Lab Results  Component Value Date   INR 5.6 02/21/2011   INR 4.0 01/31/2011   INR 4.1 01/17/2011    ANTI-COAG DOSE:   Latest dosing instructions   Total Sun Mon Tue Wed Thu Fri Sat   12.5 2.5 mg   2.5 mg 2.5 mg 2.5 mg 2.5 mg    (5 mg0.5)   (5 mg0.5) (5 mg0.5) (5 mg0.5) (5 mg0.5)         ANTICOAG SUMMARY: Anticoagulation Episode Summary              Current INR goal 2.0-3.0 Next INR check 02/28/2011   INR from last check 5.6! (02/21/2011)     Weekly max dose (mg)  Target end date Indefinite   Indications ATRIAL FIBRILLATION, CEREBELLAR INFARCT, Long term current use of anticoagulant   [facility] check location [facility] Preferred lab    Send INR reminders to [facility]   Comments        Provider Role Specialty Phone number   Blanch Media  Internal Medicine [phone]        ANTICOAG TODAY: Anticoagulation Summary as of 02/21/2011              INR goal 2.0-3.0     Selected INR 5.6! (02/21/2011) Next INR check 02/28/2011   Weekly max dose (mg)  Target end date Indefinite   Indications ATRIAL FIBRILLATION, CEREBELLAR INFARCT, Long term current use of anticoagulant    Anticoagulation Episode Summary              INR check location [facility] Preferred lab    Send INR reminders to [facility]   Comments        Provider Role Specialty Phone number   Blanch Media  Internal Medicine [phone]        PATIENT INSTRUCTIONS: Patient Instructions  Patient instructed to take medications as defined in the Anti-coagulation Track section of this encounter.  Patient instructed to OMIT today's dose and TOMORROW's DOSE.  Patient verbalized understanding of these instructions.        FOLLOW-UP Return in 7 days (on  02/28/2011) for Follow up INR.  [name], [name] Pharm.D., CACP

## 2011-02-21 NOTE — Progress Notes (Signed)
  Subjective:    Patient ID: Kim [name], female    DOB: 06/11/1929, 77 y.o.   MRN: [id number]  HPI Patient here on follow up to review her reslutls, most notable for a digoxin level of 2.0 in the setting of anorexia and mild nauseau. We reviewed that today, and she stated "Oh yeah, I thought that was it because I stopped it on my own last summer and felt better then felt worse 2 weeks later when I started it again." We also reviewed her other lab and x-ray data which was consistent with gout.  We will stop the digoxin, and review her symptoms in a follow up visit in two weeks  Review of Systems     Objective:   Physical Exam  Constitutional: She appears well-developed.  Cardiovascular: Normal rate, regular rhythm and normal heart sounds.   Pulmonary/Chest: Effort normal. She has no wheezes. She has no rales.  Musculoskeletal: She exhibits no edema.       Multiple tophi, and chronic changes of gout bilateral hands.  Skin: Skin is warm and dry. No erythema.          Assessment & Plan:

## 2011-02-21 NOTE — Assessment & Plan Note (Signed)
Stop digoxin (note there was some initial confusion on the part of [facility]-they initialy reported her last refill was late 2/12, then informed me on a follow up correction call she'd gotton it on 3/23 as well)  She should take all medications to the pharmacy to ensure no mix-ups in which medicine to stop-pharmacy was also called about this, and advised to discontinue digoxin

## 2011-02-21 NOTE — Patient Instructions (Signed)
PLEASE STOP TAKING THE DIGOXIN PILL-THAT IS PROBABLY MAKING YOU FEEL SICK  WHEN YOU GO TO THE [facility]GTORE TAKE ALL YOUR MEDICINES TO THE STORE SO THEY CAN GET RID OF THE ONE YOU SHOULD STOP

## 2011-02-28 ENCOUNTER — Ambulatory Visit (INDEPENDENT_AMBULATORY_CARE_PROVIDER_SITE_OTHER): Payer: MEDICARE | Admitting: Pharmacist

## 2011-02-28 DIAGNOSIS — I4891 Unspecified atrial fibrillation: Secondary | ICD-10-CM

## 2011-02-28 DIAGNOSIS — Z7901 Long term (current) use of anticoagulants: Secondary | ICD-10-CM

## 2011-02-28 DIAGNOSIS — I6789 Other cerebrovascular disease: Secondary | ICD-10-CM

## 2011-02-28 NOTE — Patient Instructions (Signed)
Patient instructed to take medications as defined in the Anti-coagulation Track section of this encounter.  Patient instructed to take today's dose.  Patient verbalized understanding of these instructions.    

## 2011-02-28 NOTE — Progress Notes (Addendum)
Anti-Coagulation Progress Note  Atira L Glahn is a 77 y.o. female who is currently on an anti-coagulation regimen.    RECENT RESULTS: Recent results are below, the most recent result is correlated with a dose of 12.5 mg. per week: Lab Results  Component Value Date   INR 2.4 02/28/2011   INR 5.6 02/21/2011   INR 4.0 01/31/2011    ANTI-COAG DOSE:   Latest dosing instructions   Total Sun Mon Tue Wed Thu Fri Sat   15 2.5 mg  2.5 mg 2.5 mg 2.5 mg 2.5 mg 2.5 mg    (5 mg0.5)  (5 mg0.5) (5 mg0.5) (5 mg0.5) (5 mg0.5) (5 mg0.5)         ANTICOAG SUMMARY: Anticoagulation Episode Summary              Current INR goal 2.0-3.0 Next INR check 03/21/2011   INR from last check 2.4 (02/28/2011)     Weekly max dose (mg)  Target end date Indefinite   Indications ATRIAL FIBRILLATION, CEREBELLAR INFARCT, Long term current use of anticoagulant   [facility] check location [facility] Preferred lab    Send INR reminders to [facility]   Comments        Provider Role Specialty Phone number   Blanch Media  Internal Medicine [phone]        ANTICOAG TODAY: Anticoagulation Summary as of 02/28/2011              INR goal 2.0-3.0     Selected INR 2.4 (02/28/2011) Next INR check 03/21/2011   Weekly max dose (mg)  Target end date Indefinite   Indications ATRIAL FIBRILLATION, CEREBELLAR INFARCT, Long term current use of anticoagulant    Anticoagulation Episode Summary              INR check location [facility] Preferred lab    Send INR reminders to [facility]   Comments        Provider Role Specialty Phone number   Blanch Media  Internal Medicine [phone]        PATIENT INSTRUCTIONS: Patient Instructions  Patient instructed to take medications as defined in the Anti-coagulation Track section of this encounter.  Patient instructed to take today's dose.  Patient verbalized understanding of these instructions.        FOLLOW-UP Return in 3 weeks (on 03/21/2011)  for Follow up INR.  [name], [name] Pharm.D., CACP   Ms. Klute' history and INR reviewed.  I agree with Dr. Saralyn Pilar assessment and plan.

## 2011-03-07 ENCOUNTER — Other Ambulatory Visit: Payer: Self-pay | Admitting: *Deleted

## 2011-03-07 ENCOUNTER — Other Ambulatory Visit: Payer: Self-pay | Admitting: Internal Medicine

## 2011-03-08 MED ORDER — WARFARIN SODIUM 5 MG PO TABS: 5.0000 mg | ORAL_TABLET | ORAL | Status: DC

## 2011-03-09 NOTE — Telephone Encounter (Signed)
Coumadin refilled by Dr. Rogelia Boga; [facility] called.

## 2011-03-11 ENCOUNTER — Ambulatory Visit (INDEPENDENT_AMBULATORY_CARE_PROVIDER_SITE_OTHER): Payer: MEDICARE | Admitting: Internal Medicine

## 2011-03-11 ENCOUNTER — Encounter: Payer: Self-pay | Admitting: Internal Medicine

## 2011-03-11 DIAGNOSIS — R634 Abnormal weight loss: Secondary | ICD-10-CM

## 2011-03-11 DIAGNOSIS — R110 Nausea: Secondary | ICD-10-CM

## 2011-03-11 MED ORDER — ONDANSETRON HCL 4 MG PO TABS: 4.0000 mg | ORAL_TABLET | Freq: Three times a day (TID) | ORAL | Status: AC | PRN

## 2011-03-11 NOTE — Progress Notes (Signed)
  Subjective:    Patient ID: Kim [name], female    DOB: 03-Oct-1929, 77 y.o.   MRN: [id number]  HPI  [name] is a 77 year old woman who presents today for follow up from her last appointment with Dr. Reche Dixon.  She continues to have problems with her nausea.  She was off her digoxin for approximately 3 weeks and then had an episode of chest "pressure" so she restarted the medication.  She denies any fevers, chills, vomiting, diarrhea, constipation, chest pain, abdominal pain, or diaphoresis.  Her nausea is intermittent throughout the day and does not correlate with meals or time of day.  She does not vomit but it makes it hard to eat because of the stomach discomfort.   She had been losing weight but today is stable from her last visit. She otherwise has no other complaints.    Review of Systems    Constitutional: Positive for appetite change. Denies fever, chills, diaphoresis, and fatigue.  HEENT: Denies photophobia, eye pain, redness, hearing loss, ear pain, congestion, sore throat, rhinorrhea, sneezing, mouth sores, trouble swallowing, neck pain, neck stiffness and tinnitus.   Respiratory: Denies SOB, DOE, cough, chest tightness,  and wheezing.   Cardiovascular: Denies chest pain, palpitations and leg swelling.  Gastrointestinal: Positive for nausea. Denies vomiting, abdominal pain, diarrhea, constipation, blood in stool and abdominal distention.  Genitourinary: Denies dysuria, urgency, frequency, hematuria, flank pain and difficulty urinating.  Musculoskeletal: Denies myalgias, back pain, joint swelling, arthralgias and gait problem.  Skin: Denies pallor, rash and wound.  Neurological: Denies dizziness, seizures, syncope, weakness, light-headedness, numbness and headaches.  Hematological: Denies adenopathy. Easy bruising, personal or family bleeding history  Psychiatric/Behavioral: Denies suicidal ideation, mood changes, confusion, nervousness, sleep disturbance and  agitation  Objective:   Physical Exam    Constitutional: Vital signs reviewed.  Patient is a frail, elderly woman in no acute distress and cooperative with exam. Alert and oriented x3.  Head: Normocephalic and atraumatic Ear: TM normal bilaterally Mouth: no erythema or exudates, MMM Eyes: PERRL, EOMI, conjunctivae normal, No scleral icterus.  Neck: Supple, Trachea midline normal ROM, No JVD, mass, thyromegaly, or carotid bruit present.  Cardiovascular: RRR, S1 normal, S2 normal, no MRG, pulses symmetric and intact bilaterally Pulmonary/Chest: CTAB, no wheezes, rales, or rhonchi Abdominal: Soft. Non-tender, non-distended, bowel sounds are normal, no masses, organomegaly, or guarding present.  GU: no CVA tenderness Musculoskeletal: Multiple tophi over her hands bilaterally, there is significant radial deviation.  Hematology: no cervical, inginal, or axillary adenopathy.  Neurological: A&O x3, Strength is diminished but symmetric bilaterally, cranial nerve II-XII are grossly intact, no focal motor deficit, sensory intact to light touch bilaterally.  Skin: Warm, dry and intact. No rash, cyanosis, or clubbing.  Psychiatric: Normal mood and affect. speech and behavior is normal. Judgment and thought content normal. Cognition and memory are normal.   Assessment & Plan:

## 2011-03-11 NOTE — Patient Instructions (Addendum)
Stop the Digoxin  Continue all your other medications as prescribed  Follow up with me in 2 weeks to see how your doing.  If you notice the chest pressure again please call the office at [phone].

## 2011-03-15 NOTE — Discharge Summary (Signed)
NAMEROSEANNA, [name]            ACCOUNT NO.:  [id number]   MEDICAL RECORD NO.:  [id number]          PATIENT TYPE:  INP   LOCATION:  6708                         FACILITY:  [facility]   PHYSICIAN:  Waldemar Dickens, MD     DATE OF BIRTH:  09/24/1929   DATE OF ADMISSION:  04/05/2009  DATE OF DISCHARGE:  04/07/2009                               DISCHARGE SUMMARY   PRIMARY CARE PHYSICIAN:  Waldemar Dickens, MD, of the Outpatient Clinic.   PULMONOLOGIST:  [name]. [name], MD   DISCHARGE DIAGNOSES:  1. Shortness of breath secondary to congestive heart failure      exacerbation.  2. Left hip pain, likely secondary to rheumatoid arthritis, negative      hip x-ray.  3. Supratherapeutic INR of 4.9 on admission.  4. Chronic renal insufficiency with a baseline creatinine of 1.65-      1.80.  5. Diastolic congestive heart failure with moderate to markedly      increased left ventricular wall thickness on February 2010 echo.  6. History of left cerebellar cerebrovascular accident.  7. Atrial fibrillation, on chronic Coumadin.  8. Hypertension.  9. Rheumatoid arthritis.  10.Hyperlipidemia.  11.Chronic hypokalemia.  12.Anxiety.  13.Gastroesophageal reflux disease.  14.Glaucoma with recent right cataract removal.  15.Gout.  16.Questionable history of interstitial pulmonary fibrosis.   DISCHARGE MEDICATIONS:  1. P.o. Lasix 80 mg in the morning and 40 mg at night, please note      that this was increased from the patient's prior dose of 40 mg      b.i.d.  The patient was provided with the prescription for this      upon discharge.  2. Probenecid 500 mg b.i.d.  3. Colchicine 0.6 mg b.i.d.  4. Metoprolol 150 mg b.i.d.  5. P.o. Valium 5 mg b.i.d. p.r.n. anxiety.  6. P.o. clonidine 0.2 mg b.i.d.  7. Tylenol p.r.n.  8. Klor-Con 40 mEq b.i.d.  9. Xalatan drops for glaucoma.  10.P.o. Norvasc 10 mg daily.  11.P.o. Nexium 40 mg daily.  12.P.o. Coumadin 5 mg daily, to be started on the day after  discharge.  13.P.o. Zantac 75 mg daily.   CONDITION AT DISCHARGE:  The patient's shortness of breath had improved  with diuresis.  The patient was ambulated prior to discharge and had an  oxygenation of 93% after ambulation.  The patient sounded better on  pulmonary exam.  The patient's left hip pain had improved from  admission.  The patient was discharged on the elevated Lasix dose as  above and will follow up with Dr. Clelia Croft in the Outpatient Clinic and has  an appointment scheduled for June 21 at 10:30 a.m.  The patient also has  an appointment in the Coumadin Clinic at 10 a.m. on June 21.  The  patient will also have an INR and a BMET drawn by home health and this  is scheduled for June 5.  We will follow up on the results of that in  the Outpatient Clinic.   CONSULTATIONS:  None.   IMAGING:  A left hip x-ray with the following impression:  Axial  migration of  the hips with medial joint space narrowing bilaterally  consistent with stated clinical history of rheumatoid arthritis.  No  acute osseous abnormality identified.  Osteopenia.   Admission chest x-ray on June 6:  Prominent perihilar markings and some  pulmonary vascular indistinctness.  Favor pneumonia over edema.  Consider follow up.   Repeat chest x-ray on April 06, 2009 with the following impression:  Congestive heart failure.   Repeat chest x-ray on June 8 with following impression:  Increased  interstitial densities are redemonstrated, attributed to pulmonary  edema.   HISTORY OF PRESENT ILLNESS:  The patient is a 77 year old female with  above past medical history who presents with shortness of breath x1 day.  The patient is unsure of the activity during which she first noticed it.  She reports increased shortness of breath while lying flat and dyspnea  on exertion along with a mild dry cough and mild chest tightness.  She  denies any fevers, but does report occasional chills.  She denies any  sick contacts.  The  patient took 2 doses of her Lasix which is 40 mg  both this morning and in the evening prior to coming in.  The patient  reports good compliance with medications.  The patient says that she  tried to stick to a low-salt diet, but her daughter who was present  during the interview disagrees.  Her daughter states that she has  increased her TV dinner intake and does not comply with low-salt diet.  The patient also complains of left hip pain that she reports brought her  to the ED.  She has chronic rheumatoid arthritis with frequent  exacerbations like this previously.  She denies any recent trauma or hip  injuries.   PHYSICAL EXAMINATION:  ADMISSION VITAL SIGNS:  Temperature 97.5, blood  pressure of 139/84, pulse of 87, respiratory rate of 12, and O2  saturation of 89% on room air and 93% on 2 liters.  GENERAL:  No apparent distress.  HEENT:  Eyes, arcus senilis.  ENT; dry mucous membranes.  Pink  oropharynx.  NECK:  Supple.  RESPIRATORY:  Bilateral upper inspiratory crackles, right greater than  left.  No wheezes.  No rhonchi.  CARDIOVASCULAR:  Regular rate and rhythm.  No murmurs, rubs, or gallops.  ABDOMEN:  Soft, nontender, and nondistended.  EXTREMITIES:  No cyanosis, clubbing, or edema.  No asymmetry of the calf  or calf tenderness and no erythema.  She does have some left ankle  tenderness.  MUSCULOSKELETAL:  Significant arthritic changes, ulnar deviation, swan-  neck deformities, and her left hip is tender.  NEURO:  She is alert and oriented with no focal deficits.  PSYCH:  Appropriate.   ADMISSION LABORATORY DATA:  Sodium 144, potassium 3.0, chloride of 105,  bicarbonate 25, BUN of 18, creatinine of 1.73, and glucose of 106.  INR  of 4.9, PT of 50.0, and PTT of 80.  BNP of 388.  White blood cell count  of 6.8, hemoglobin of 13.4, and platelets of 188.  ESR of 31.  Urinary  strep antigen was negative.  Magnesium of 1.6.   Urinalysis shows a specific gravity of 1.01, pH of  5.5, large amount of  bilirubin, moderate leukocytes, negative nitrites with 3-6 white blood  cells on the micro.  The patient also had a D-dimer which was 0.28.   HOSPITAL COURSE:  1. Shortness of breath likely secondary to CHF exacerbation:  The      patient was initially admitted  secondary to her shortness of breath      and left hip pain.  A chest x-ray was obtained in the ED which      showed bilateral opacities consistent with differential of      pneumonia versus pulmonary edema.  Given that the patient had      possible finding of pneumonia, she was treated with Rocephin and      Avelox in the ED.  The Rocephin was also for the patient's urinary      tract infection.  The patient did not have a fever, and her white      blood cell count was normal.  Additionally, she did not have a      productive cough, instead a dry cough and orthopnea more consistent      with her known history of CHF.  Upon repeat chest x-rays, it was      evident that there was no infiltrate and the patient's symptoms      were solely the product of her CHF.  For this reason, her      antibiotics were discontinued upon discharge.  However, her Lasix      was increased to 80 mg in the morning and 40 at night.  She      diuresed fairly well during her hospitalization and was ambulated      without nasal cannula oxygen.  Her oxygenation after ambulation      again without the oxygen was 93%.  For this reason, she was fit to      be discharge.  She will follow up in the Outpatient Clinic for      further titration her Lasix and further management of her chronic      medical conditions.  2. Left hip pain:  She has a history of waxing and waning left hip      pain and came in with an acute exacerbation upon this admission.  X-      ray was obtained which showed no osseous abnormalities and nothing      acute.  She does suffer from long-term rheumatoid arthritis and      this was evidenced by the plain film.  The  patient was managed      symptomatically with Tylenol while hospitalized and her hip pain      did improve.  She has had difficulty ambulating and should follow      up as an outpatient.  3. Chronic renal insufficiency:  Her baseline creatinine is 1.65-1.80      and her initial BMET was consistent with this having had a      creatinine of 1.73.  With the diuresis, her creatinine did bump up      to 1.27 upon discharge and it should be followed in the Outpatient      Clinic.  4. Hypokalemia:  The patient has chronic hypokalemia and takes      supplements with 40 mEq of Klor-Con b.i.d.  The patient was      repleted as necessary each day and her discharge potassium was 4.0.      Magnesium was checked which was 1.80.  5. Hypertension:  The patient was continued on her antihypertensives      while hospitalized and her blood pressure remained in the 130-160      range for the most part excluding one elevated blood pressure of      180 on the morning after admission.  6. Supratherapeutic INR:  The patient's initial INR was 4.9, and her      Coumadin was held upon admission.  She did not display any signs of      bleeding and a repeat INR on the next day did increase to 7.5.      Again, her Coumadin was held throughout the hospitalization and her      discharge INR was 4.2.  She was instructed per pharmacy's      recommendation to restart her Coumadin on the day after discharge      and she will have a repeat INR check at home which was ordered for      June 10.  We will follow up on this and adjust her Coumadin      accordingly.  We will also see the patient back in the Outpatient      Clinic for general medical followup as well as a Coumadin check up      with Dr. [name] on June 21.   DISCHARGE VITAL SIGNS:  Temperature 97.8, pulse 76, respiratory rate of  20, blood pressure 140/88, and O2 saturation 96% on room air.   DISCHARGE BMET:  Sodium 145, potassium 4.0, chloride of 111,  bicarbonate  25, glucose 102, BUN of 23, creatinine 1.79, and calcium 9.0.  White  blood cell count of 4.8, hemoglobin 11.9, and platelets of 160.   ADDITIONAL LABORATORY DATA:  Blood cultures negative x2.  TSH of 4.43.  Cardiac enzymes were cycled and were negative x3.  Fasting lipid panel  showed cholesterol of 172, an LDL of 99, HDL of 46, and triglyceride  level of 134.  The patient also had a sed rate of 31, and a urinary  strep antigen that was negative.   PENDING LABORATORY DATA:  There are no pending labs at this time.      Linward Foster, MD  Electronically Signed      Waldemar Dickens, MD     LW/MEDQ  D:  04/07/2009  T:  04/08/2009  Job:  [id number]

## 2011-03-15 NOTE — Discharge Summary (Signed)
Kim [name], Kim [name]            ACCOUNT NO.:  [id number]   MEDICAL RECORD NO.:  [id number]          PATIENT TYPE:  OBV   LOCATION:  6523                         FACILITY:  [facility]   PHYSICIAN:  Acey Lav, MD  DATE OF BIRTH:  [birth year]   DATE OF ADMISSION:  12/08/2008  DATE OF DISCHARGE:  12/10/2008                               DISCHARGE SUMMARY   CONSULTANT:  Pulmonary physician, [name]. [name], MD   DISCHARGE DIAGNOSES:  1. Shortness of breath, likely secondary to congestive heart failure      with possible restrictive pulmonary component, question of      interstitial fibrosis on prior x-rays, but no evidence of pulmonary      fibrosis on high-resolution CT on this admission.  2. Diastolic congestive heart failure with mildly increased wall      thickness in May 2009, a repeat 2-D echo is pending.  3. Atrial fibrillation, on chronic Coumadin.  4. Rheumatoid arthritis.  5. Gout.  6. Glaucoma with a recent right cataract removal.  7. Chronic renal insufficiency with baseline creatinine 1.4-1.6.  8. Hypertension.   DISCHARGE MEDICATIONS:  1. P.o. Norvasc 10 mg daily.  2. P.o. Lasix 40 mg b.i.d., please note the patient was provided      prescription for this medication and had not has been taking it      prior to hospitilization.  3. P.o. Klor-Con 40 mEq b.i.d., prescription given.  4. P.o. Valium 5 mg b.i.d. p.r.n.  5. P.o. clonidine 0.2 mg b.i.d.  6. Xalatan 0.005% eye drops use as directed.  7. P.o. Percocet 5/325 q.6 h, p.r.n. pain.  8. P.o. Nexium 40 mg daily.  9. P.o. Coumadin 5 mg daily and follow up with Dr. [name] in the      outpatient clinic for further Coumadin care.  10.P.o. Zantac as needed.  11.P.o. probenecid 500 mg b.i.d.  12.Colchicine 0.6 mg b.i.d.  13.P.o. metoprolol 150 mg b.i.d.   CONDITION ON DISCHARGE:  The patient's shortness of breath had improved  markedly.  She was ambulated and had a pre-ambulation O2 saturation of  94% and then  her lowest oxygen while ambulating was 91%.  She had been  diuresed with just p.o. Lasix while hospitalized and she continued on  Lasix as an outpatient.  She will be followed up with Dr. [name] at the  outpatient clinic on December 18, 2008, at 3:00 p.m.  At that time, her  breathing status should be assessed, her repeat 2-D echo should be  evaluated as it is not yet back.  Also, she has longstanding rheumatoid  arthritis and has been seeing orthopedist, but would likely benefit from  seeing a rheumatologist for further management.  The patient also has an  appointment scheduled with Dr. [name] at the Outpatient Coumadin Clinic  on December 25, 2008 at 2:30 p.m.   PROCEDURES:  1. High-resolution CT of the chest with the following impression:      There are no specific features to suggest pulmonary fibrosis and      congestive heart failure.  There is a perfusion pattern to both the  lung suggests either small airway disease or small vessel disease.  2. Admission chest x-ray that showed mild CHF and bibasilar      atelectasis.  3. An echo was performed on December 08, 2008.  At the time of this      dictation on December 10, 2008, the echo has not been read.  Please      follow this up as an outpatient.  If that is markedly changed,      please refer the patient to Cardiology.   CONSULTATION:  [name]. [name], MD, of Pulmonary Medicine.   ADMISSION HISTORY AND PHYSICAL:  The patient is a 77 year old female  with AFib, hypertension, LVEF 60%, mild diastolic dysfunction who  presents with 2-week history of increased shortness of breath, mild dry  cough that started on November 27, 2008.  When the patient had increased  activity, was going to get her MRI of her neck.  The patient reports  having to walk long distances that day noticing increased shortness of  breath.  Has progressively worsened and continued the patient shortness  of breath with minimal activity, but she denies  shortness of breath at  rest.  The patient denies any chest pain, fevers, or chills.  No  positional component of the shortness of breath and she denies having  increased with lying flat.  The patient unsure if she is taking her  Lasix and she is supposed to be on Lasix 40 b.i.d.   PHYSICAL EXAMINATION:  VITAL SIGNS:  On admission, temperature 97.9,  blood pressure of 134/93, pulse is 69-78, respiratory rate 16-22, and O2  sat 88% on room air, and 94% on 2 liters.  GENERAL:  No apparent distress.  HEENT:  Eyes:  EOMI, PERRLA.  ENT:  Moist membranous mucosa, pink  oropharynx.  NECK:  No bruits.  RESPIRATORY:  Bibasilar crackles.  Good air movement.  No wheezes, no  rhonchi.  CARDIOVASCULAR:  Regular rate and rhythm.  No murmurs, rubs, or gallops.  GI:  Obese, soft, nontender, nondistended.  EXTREMITIES:  No cyanosis or clubbing.  1+ trace pedal edema.  SKIN:  No rashes.  NEUROLOGIC:  Cranial nerves II through XII were intact, grossly  nonfocal.  PSYCH:  Appropriate.   LABORATORY DATA:  Sodium 144, potassium of 3, chloride of 108, bicarb of  24, BUN of 15, creatinine 1.8, glucose of 101.  CBC is as follows:  White blood cell count of 4.8, hemoglobin of 13.6, platelets of 217, MCV  of 92.1, and RDW of 15.9.   BNP of 558.  Cardiac enzymes cycled and were all negative.  UA showed  large bilirubin and small leukocytes, the micro, 3-6 white blood cell  count.  Admission INR 2.3, PT 26.3, LDL of 107, HDL of 46.   EKG was significant for T-wave inversion in V3 and old T-wave inversion  in V4 through V6 and the patient was in AFib.  No ST changes otherwise  noted.   HOSPITAL COURSE:  1. Shortness of breath:  As the patient was admitted with an initial      O2 saturation of 88% and findings suggestive of decompensated CHF      including a BNP of 500 and pulmonary vascular congestion on chest x-      ray.  For this reason, she was gently diuresed with 40 mg of Lasix      b.i.d., her home  dose, and improved significantly.  Her O2 sats  remained in the 97-99 range on 2 liters and on room air upon      discharge.  She was ambulated and her preambulation O2 was 94% and      then she saturated just to 91% on discharge.  Given that she has      the abrupt onset of dyspnea, her cardiac enzymes were cycled and      were negative x3 and she only had T-wave inversion in V3 that was      new.  She was thus ruled out for acute coronary syndrome.  On prior      chest x-rays, there was mention of diffuse interstitial opacities      concerning for chronic interstitial disease and then on February      2006 chest x-ray, there was chronic interstitial accentuation      consistent with interstitial fibrosis.  For this reason, the      possibility of pulmonary fibrosis was evaluated with high-      resolution chest CT.  CT showed a mosaic pattern more consistent      with heterogenous obstructive disease and without evidence for      interstitial lung disease, COP or other RA related lung disease.      Pulmonary was consulted and also agreed that there was no evidence      of fibrosis and the patient was most likely short of breath      secondary to her CHF, which was treated appropriately with      diuresis.  At this time, we are not sure what her cardiac function      is as we do not have the results of this 2-D echo.  If her baseline      has deteriorated, she may warrant outpatient cardiac followup.  2. Rheumatoid arthritis:  The patient has been seen by the      orthopedist, Dr. Myrtie Neither for rheumatoid arthritis in the      past, but we are not aware of any rheumatology visits.  She several      features of quite progressed disease with swan neck deformities,      and ulnar deviation in her hands. She has not been on any disease-      modifying drugs or prednisone.  She may benefit from rheumatology      referral in the outpatient setting.  3. Atrial fibrillation:  The patient  was continued on her Coumadin.      Had therapeutic INR during her hospitalization.  She is continued      on her metoprolol for rate control and her heart rate remained      within 60-90 range for most of her hospitalization.  4. Chronic renal insufficiency:  Her baseline creatinine is 1.4-1.6.      Her i-STAT creatinine was 1.8, but then her first CMP showed a      creatinine of 1.58 within her baseline.  She was diuresed without a      corresponding bump in her creatinine.  She should be continued to      be followed in the outpatient setting.  5. Gout:  The patient is to continue on her probenecid and colchicine,      did not have an acute flare while hospitalized.  6. Hypertension:  The patient was continued on her Norvasc,      metoprolol, and clonidine while hospitalized and her blood      pressures remained within  the good range.  7. Discharge labs and vitals:  The patient's last CBC was as follows.      White blood cell count 4.9, hemoglobin of 13.5, platelets of 231.      Sodium 139, potassium 3.3, chloride of 102, glucose of 119, BUN of      12, creatinine of 1.65, INR of 2.4.  8. Pending labs:  As noted above, the echo is still pending.      Linward Foster, MD  Electronically Signed      Acey Lav, MD  Electronically Signed    LW/MEDQ  D:  12/10/2008  T:  12/11/2008  Job:  [id number]

## 2011-03-15 NOTE — Op Note (Signed)
NAMEJARED, CAHN            ACCOUNT NO.:  [id number]   MEDICAL RECORD NO.:  [id number]          PATIENT TYPE:  INP   LOCATION:  3740                         FACILITY:  [facility]   PHYSICIAN:  Salley Scarlet., M.D.DATE OF BIRTH:  [birth year]   DATE OF PROCEDURE:  09/15/2007  DATE OF DISCHARGE:  09/15/2007                               OPERATIVE REPORT   PREOPERATIVE DIAGNOSIS:  Mature cataract right eye.   POSTOPERATIVE DIAGNOSIS:  Mature cataract right eye.   OPERATION:  Extracapsular cataract extraction with intraocular lens  implantation.   ANESTHESIA:  Local using Xylocaine 2% with Marcaine 0.75%, [place].   INDICATIONS FOR PROCEDURE:  This is a 77 year old lady who has been  followed for several years for progressive cataract formation.  She has  a history of severe glaucoma for which she is taking medications.  She  has complained of inability to see from the left eye and correction from  the right eye for quite some time, but she has not been amenable to  cataract surgery.  The cataract has become sleet white and she has now  decided she is ready to have the surgery done.  She is evaluated and  found to have a visual acuity of hand motions on the right, 20/40 on the  left.  Cataract extraction of the right eye was recommended.  She is  admitted at this time for that purpose.   PROCEDURE:  Under the influence of IV sedation, Van Lint akinesia and  retrobulbar anesthesia were given.  The patient was prepped and draped  in the usual manner.  The lid speculum was inserted under the lower lid  of the right eye and a 4-0 silk traction suture was passed through the  belly of the superior rectus muscle retraction.  A fornix-based  conjunctival flap was turned and hemostasis was achieved using cautery.  A groove incision was made around the limbus  for 110 degrees.  The  anterior chamber was then entered through this groove incision at 1135  position using a Superblade.   Ocucoat was injected into the eye through  the sideport incision.  An anterior capsulotomy was done using a bent 25  gauge needle.  The corneoscleral wound was then extended first toward  the left and then toward the right placing a single 8-0 Vicryl suture  across each incision and it was made respectively toward the left and  toward the right.  The nucleus was then manually expressed from the eye.  The two previously placed sutures were closed and a third one was placed  at the 12 o'clock position.  The IA handpiece was passed into the eye  and the residual cortical material was aspirated.  The posterior capsule  was polished using the olive tip polisher.  Ocucoat was injected into  the eye and a posterior chamber lens of the EZE 60 variety was inserted  into the eye without difficulty.  The anterior chamber was reformed and  the pupil was constricted using Miochol.  The residual Ocucoat was  aspirated from the eye.  The corneoscleral wound was closed with using a  combination of interrupted sutures of 8-0 Vicryl and 10-0 nylon.  After  that it was ascertained that the wound was airtight and watertight.  Conjunctiva was closed using thermal cautery.  Then 1 cc of Celestone  and 0.5 cc of gentamicin were injected subconjunctivally.  Maxitrol  ophthalmic ointment and pilocarpine ointment were applied along with a  patch and Fox shield.  The patient tolerated the procedure well and was  discharged to the post anesthesia recovery in satisfactory condition.  Because this lady had developed some atrial fibrillation during the  procedure the  anesthesiologist felt it was best to have a medical evaluation.  Therefore a  medical consultation was obtained from  the medical doctor who admitted her for observation.  She will be  discharged from the medical service when she is stable.   DISCHARGED DIAGNOSIS:  Mature cataract right eye.      Salley Scarlet., M.D.  Electronically  Signed     TB/MEDQ  D:  09/15/2007  T:  09/16/2007  Job:  [id number]

## 2011-03-15 NOTE — Discharge Summary (Signed)
Kim Kim [name], Kim [name]            ACCOUNT NO.:  [id number]   MEDICAL RECORD NO.:  [id number]          PATIENT TYPE:  INP   LOCATION:  3740                         FACILITY:  [facility]   PHYSICIAN:  Kim Kim [name], M.D.     DATE OF BIRTH:  1929/02/08   DATE OF ADMISSION:  09/14/2007  DATE OF DISCHARGE:  09/15/2007                               DISCHARGE SUMMARY   DISCHARGE DIAGNOSES:  1. New onset atrial fibrillation.  2. Hypokalemia.  3. Gout.  4. Hypertension.  5. Rheumatoid arthritis.  6. Glaucoma with recent right cataract removal.  7. Chronic renal insufficiency.   DISCHARGE MEDICATIONS:  1. Coumadin 5 mg orally daily until follow-up appointment with Dr.      Michaell Kim [name] in the outpatient clinic.  2. Probenecid 500 mg orally b.i.d.  3. Colchicine 0.6 mg orally b.i.d.  4. Metoprolol 150 mg orally b.i.d.  5. Valium 5 mg orally b.i.d. p.r.n.  6. Aspirin 325 mg orally daily, will hold until speaking with Dr.      Michaell Kim [name].  7. Potassium chloride 20 mEq 2 tablets orally b.i.d.  8. Percocet 5/325 mg orally every 6 hours p.r.n. pain.  9. Lasix 40 mg 2 tablets orally daily.  10.Norvasc 10 mg orally daily.  11.Eye drops instructed by Dr. Mitzi Kim [name] which includes Zymar 1 drop      in the right eye 4 times a day.  12.Xalatan 1 drop in both eyes at bedtime.  13.Timolol 1 drop in right eye 2 times a day.  14.Aspirin - The patient is to hold aspirin until follow-up      appointment with Dr. Michaell Kim [name] in the Coumadin clinic.   DISPOSITION:  The patient is to call the outpatient clinic on Monday to  have an appointment with Dr. Michaell Kim [name] for further adjustment of the  Coumadin.   PROCEDURES PERFORMED:  The patient had a right cataract removal  performed by Dr. Mitzi Kim [name] and the patient was noted to go into new-  onset AFib.   CONSULTATIONS:  There were no consultations during admission.   BRIEF HISTORY AND PHYSICAL:   CHIEF COMPLAINT:  New onset A fib.   PRIMARY CARE PHYSICIAN:  Kim Charity,  MD   HISTORY OF PRESENT ILLNESS:  The patient is a 77 year old African-  American woman with a past medical history significant for hypertension,  right eye cataract, hypothyroidism, CRI, and hyperlipidemia, presenting  to the hospital, secondary to new onset AFib.  The patient originally  came in to have a right eye cataract removal per Dr. Mitzi Kim [name] when the  patient was noted to have new-onset AFib.  The patient's only complaint  is some nausea she had in the a.m. when she took her blood pressure  medications on an empty stomach.  She denies headache, dizziness, chest  pain, palpitations, neurological changes, melena, hematochezia and  hematuria.  No other complaints.   For allergies, past medical history, home medications, substance  history, social history and family history, please see hospital chart.   REVIEW OF SYSTEMS:  As mentioned in the HPI.   PHYSICAL EXAMINATION:  VITAL SIGNS:  Temperature equals 97, blood  pressure equals 150/82, pulse equals 106, respiratory rate equals 12, O2  sats equal 97% on room air.  GENERAL:  The patient is in no acute distress, right eye dressing dry  and intact.  EYES:  Right eye dressing as mentioned above.  ENT: Moist oral mucosa, no oropharyngeal erythema.  NECK:  Supple, no LAD, no carotid bruits.  RESPIRATORY:  CTA bilaterally.  CV: S1, S2, irregularly irregular.  GI: Soft, nontender, positive bowel sounds.  EXTREMITIES: No edema, the patient with a 2-inch nodule on the right  shin.  SKIN:  No abnormal visible lesions.  LYMPH:  No lymphadenopathy.  MUSCULOSKELETAL:  The patient has a normal range of motion in all four  extremities.  NEURO:  Alert and oriented x3, motor 5/5, sensation intact to light  touch, cerebellar is intact.  Psych is appropriate.   ADMISSION LABS:  Include a WBC of 7.7, hemoglobin of 14.2, hematocrit  41.3, MCV 86.2, RDW 15.8, platelets 252,000, ANC 5.8.  PT equals 12.6,  INR equals 0.9, PTT equals 25.   Sodium equals 142, potassium equals 3,  chloride equals 105, bicarb 28, glucose 138, BUN 16, creatinine 1.39,  bilirubin total of 0.7, alk phos 132, AST 31, ALT 56, total protein 7.3,  albumin 3.0, calcium 8.8.  The patient had cardiac enzymes which were  negative x3 and magnesium of 1.5.  UA was normal and the patient had a  TSH also that was 6.547 and slightly elevated.  The patient had a chest  x-ray on September 14, 2007 - Impression:  Stable cardiomegaly.  No  active lung disease.   HOSPITAL COURSE BY PROBLEM:  1. New-onset A fib. -  The patient's etiology is unclear but it may be      secondary to her surgery.  The patient was admitted to a telemetry      bed after some persuading.  The patient wanted to go home.  The      patient was continued on aspirin 325 mg orally daily as well as her      home dose of metoprolol.  Throughout her hospital stay, the      patient's heart rate usually ranged from 95 to 105 which is      essentially near goal.  During her stay, a TSH, a fasting lipid      panel, UA, urine C&S chest x-ray and cardiac enzymes were all      checked.  The patient wanted to go home one day after admission and      it was decided that she could go home.  Initially, the patient      declined to have Coumadin, but upon discharge, she agreed to be set      up with the Coumadin.  She will take Coumadin 5 mg orally daily      until her follow-up appointment with Dr. Michaell Kim [name] on Monday in the      outpatient clinic.  Her aspirin will be held until her appointment      with him as well.  2. Gout.  The patient was continued on probenecid and colchicine.  3. Hypertension.  The patient's home blood pressure medications were      resumed.  4. Rheumatoid arthritis.  The patient was continued on her Percocet      with p.r.n. Tylenol for breakthrough pain.  5. Glaucoma per Dr. Mitzi Kim [name].  6. Hypothyroidism.  The patient's TSH was noted to be mildly elevated.  We will not start  Synthroid at this time.  We will leave to the      patient's primary care physician for follow-up of her thyroid      function.  7. Chronic renal insufficiency.  The patient's creatinine remained      stable during her hospital stay.  8. GERD.  The patient was continued on her Protonix.  9. Anxiety.  The patient was continued on her p.r.n. Valium.  10.Hyperlipidemia.  We checked a fasting lipid panel which is pending      at the time of discharge.  This may need to be followed up in the      outpatient setting as well.  11.Hypokalemia.  The patient's potassium was replenished and magnesium      level was checked which was also a little bit low which was      replenished as well.   DISCHARGE LABS:  Include a magnesium of 1.6, sodium 143, potassium 3.1,  chloride 102, bicarb 27, glucose 110, BUN 15, creatinine 1.59 calcium  8.3, WBC 7.8, hemoglobin 13.0, hematocrit 37.8, MCV 86.4, RDW 15.5,  platelets 274,000.   DISCHARGE VITAL SIGNS:  Temperature 97.9, pulse 107, respiratory rate  16, blood pressure 148/96, O2 sats 96% on room air.  Of note, the  patient's heart rate stays above goal when she does not revert back to  normal sinus.  Possible considerations include increasing her metoprolol  somewhat.  We will leave to PCP.      Kim Kim [name], M.D.  Electronically Signed     JY/MEDQ  D:  09/15/2007  T:  09/17/2007  Job:  [id number]   cc:   Ardeth Sportsman, MD  Salley Scarlet., M.D.

## 2011-03-18 NOTE — Cardiovascular Report (Signed)
[place] [place]  Patient:    Kim [name], Kim [name]                    MRN: [id number] Proc. Date: 02/19/01 Adm. Date:  [id number] Attending:  Daisey Must CC:         CV Laboratory   Cardiac Catheterization  INDICATIONS:  [name] is a 77 year old who presented with chest pain. She was brought to the catheterization lab for further evaluation.  Of note, the patient has a abnormal ECG and hypertension.  PROCEDURES: 1. Left heart catheterization. 2. Selective coronary arteriography. 3. Selective left ventriculography.  DESCRIPTION OF PROCEDURE:  The patient was brought to the catheterization lab and prepped and draped in the usual fashion.  Through an anterior puncture the right femoral artery was easily entered.  The patient remained hypertensive throughout the procedure and was given intravenous labetalol as well as Lopressor.  She tolerated the procedure and there were no complications.  HEMODYNAMIC DATA:  The central aortic pressure was 167/88.  LV pressure 167/24.  No gradient was recorded on pullback across the aortic valve.  ANGIOGRAPHIC DATA:  LEFT VENTRICULOGRAPHY:  Left ventriculography was performed in the RAO projection.  Left ventricular function was vigorous with normal systolic function and no segmental wall motion abnormalities.  Ejection fraction was 68.5%.  The left main coronary artery is free of significant disease.  The LAD coursed to the apex.  There were two diagonal branches and a septal perforator and these were free of disease.  The circumflex consisted of a very large bifurcating marginal branch which was free of critical disease.  The right coronary artery is a dominant vessel with a small PDA and posterolateral system.  These were free of significant disease.  CONCLUSIONS: 1. Normal left ventricular function. 2. Minimal luminal irregularities without significant focal stenosis.  DISPOSITION:  Final  disposition will be per Dr. Gerri Spore.  The patient will need a chest x-ray. DD:  02/19/01 TD:  02/20/01 Job: [id number]

## 2011-03-18 NOTE — Assessment & Plan Note (Signed)
Her weight is stable currently and we will continue to monitor as we work on trying to find the source of her nausea.

## 2011-03-18 NOTE — Assessment & Plan Note (Addendum)
She has restarted the digoxin on her own.  She has several other medications on her list that could be the cause of her nausea including her clonidine and her colchicine, especially at the dose of the colchicine she is taking.  The plan today is to have her stop the digoxin and follow up in 2 weeks to see how she is doing.  If she is having to use the Zofran consistently we will start decreasing her colchicine and may be able to stop her clonidine because her blood pressure appears to be well controlled.

## 2011-03-18 NOTE — Discharge Summary (Signed)
[place]. [facility]  Patient:    Kim [name], Kim [name]                      MRN: [id number] Adm. Date:  02/17/01 Disc. Date: 02/21/01 Attending:  Daisey Must, M.D. [facility] Dictator:   Gene Serpe, P.A. CC:         Eligha Bridegroom. [facility]   Referring Physician Discharge [place]  PROCEDURES:  Cardiac catheterization on February 19, 2001.  REASON FOR ADMISSION:  The patient is a 77 year old female with no prior history of heart disease, who presented with a several-week history of non-exertional chest discomfort associated with dyspnea.  She presented to the emergency room and was found to have abnormal EKG changes.  She was admitted for rule out of MI and further diagnostic evaluation.  LABORATORY DATA:  Serial cardiac enzymes negative for MI.  Admission CXR:  No acute disease; question left-sided goiter versus mediastinal mass.  Follow-up two-view x-ray revealed no goiter/mass.  Room air ABG:  pH 7.42, pCO2 35, pO2 88, HCO3 23.  Normal CBC on admission. Potassium 2.5 on admission and 5.2 at discharge.  Normal renal function throughout.  Mildly low calcium.  Normal liver enzymes.  HOSPITAL COURSE:  Following admission, the patient ruled out for MI with negative serial CPK-MB and troponin I enzymes.  She was placed on both heparin and IIb-IIIa inhibitor for management of acute coronary syndrome with plans to proceed with diagnostic coronary angiography.  Cardiac catheterization performed on February 19, 2001, by Arturo Morton. Riley Kill, M.D. (see catheterization report for full details) revealed normal coronary arteries with normal LV function (EF 69%).  The patient was cleared for discharge, but kept for overnight observation secondary to labile hypertension (170s-200s systolic).  Medications were adjusted with up titration of Altace, which was new this admission, as well as up titration of Lopressor.  The blood pressure was markedly improved  the following day and the patient was cleared for discharge.  The patient was also noted to have hypokalemia this admission (2.5 on admission) which repleted with a discharge level of 5.2.  Recommendations are to have a follow-up BMP in one week.  MEDICATION ADJUSTMENTS THIS ADMISSION:  Addition of Altace, potassium supplement, and Protonix.  MEDICATIONS AT DISCHARGE: 1. Clonidine 0.2 mg b.i.d. 2. Metoprolol 150 mg b.i.d. 3. Colchicine 0.6 mg b.i.d. 4. Probenecid 500 mg b.i.d. 5. Lasix 40 mg q.d. 6. Aspirin 81 mg q.d. 7. Altace 5 mg b.i.d. 8. K-Dur 20 mEq q.d. 9. Protonix 40 mg q.d.  DISCHARGE INSTRUCTIONS:  Refrain from any heavy lifting/driving x 2 days. Maintain a low-fat/cholesterol diet.  Call the office if there is any bleeding/swelling from the groin.  The patient is instructed to keep her scheduled follow-up appointment with her primary care physician at the [facility]. [facility] next week.  Recommendations are also to have a follow-up metabolic profile at that time for monitoring of hyperkalemia.  DISCHARGE DIAGNOSES: 1. Noncardiac chest pain.    a. Normal coronary angiogram on February 19, 2001. 2. Uncontrolled hypertension. 3. Hypokalemia. 4. Remote tobacco. 5. Gastroesophageal reflux disease. 6. Rheumatoid arthritis. 7. Gout. DD:  02/21/01 TD:  02/21/01 Job: [id number]

## 2011-03-21 ENCOUNTER — Ambulatory Visit: Payer: Medicare Other

## 2011-04-25 ENCOUNTER — Ambulatory Visit (INDEPENDENT_AMBULATORY_CARE_PROVIDER_SITE_OTHER): Payer: Medicare Other | Admitting: Pharmacist

## 2011-04-25 DIAGNOSIS — I6789 Other cerebrovascular disease: Secondary | ICD-10-CM

## 2011-04-25 DIAGNOSIS — I4891 Unspecified atrial fibrillation: Secondary | ICD-10-CM

## 2011-04-25 DIAGNOSIS — Z7901 Long term (current) use of anticoagulants: Secondary | ICD-10-CM

## 2011-04-25 LAB — POCT INR: INR: 1.6

## 2011-04-25 NOTE — Progress Notes (Signed)
Patient instructed to take medications as defined in the Anti-coagulation Track section of this encounter.  Patient instructed to take today's dose.  Patient verbalized understanding of these instructions.    

## 2011-04-25 NOTE — Patient Instructions (Signed)
Patient instructed to take medications as defined in the Anti-coagulation Track section of this encounter.  Patient instructed to take today's dose.  Patient verbalized understanding of these instructions.    

## 2011-04-25 NOTE — Progress Notes (Signed)
Anti-Coagulation Progress Note  Kim [name] is a 77 y.o. female who is currently on an anti-coagulation regimen.    RECENT RESULTS: Recent results are below, the most recent result is correlated with a dose of 15 mg. per week: Lab Results  Component Value Date   INR 1.60 04/25/2011   INR 2.4 02/28/2011   INR 5.6 02/21/2011    ANTI-COAG DOSE:   Latest dosing instructions   Total Sun Mon Tue Wed Thu Fri Sat   17.5 2.5 mg 2.5 mg 2.5 mg 2.5 mg 2.5 mg 2.5 mg 2.5 mg    (5 mg0.5) (5 mg0.5) (5 mg0.5) (5 mg0.5) (5 mg0.5) (5 mg0.5) (5 mg0.5)         ANTICOAG SUMMARY: Anticoagulation Episode Summary              Current INR goal 2.0-3.0 Next INR check 05/09/2011   INR from last check 1.60! (04/25/2011)     Weekly max dose (mg)  Target end date Indefinite   Indications ATRIAL FIBRILLATION, CEREBELLAR INFARCT, Long term current use of anticoagulant   INR check location [facility] Preferred lab    Send INR reminders to [facility]   Comments        Provider Role Specialty Phone number   Blanch Media  Internal Medicine [phone]        ANTICOAG TODAY: Anticoagulation Summary as of 04/25/2011              INR goal 2.0-3.0     Selected INR 1.60! (04/25/2011) Next INR check 05/09/2011   Weekly max dose (mg)  Target end date Indefinite   Indications ATRIAL FIBRILLATION, CEREBELLAR INFARCT, Long term current use of anticoagulant    Anticoagulation Episode Summary              INR check location [facility] Preferred lab    Send INR reminders to [facility]   Comments        Provider Role Specialty Phone number   Blanch Media  Internal Medicine [phone]        PATIENT INSTRUCTIONS: Patient Instructions  Patient instructed to take medications as defined in the Anti-coagulation Track section of this encounter.  Patient instructed to take today's dose.  Patient verbalized understanding of these instructions.       FOLLOW-UP Return in 2  weeks (on 05/09/2011) for Follow up INR.  [name], [name] Pharm.D., CACP

## 2011-05-09 ENCOUNTER — Ambulatory Visit: Payer: Medicare Other

## 2011-05-23 ENCOUNTER — Ambulatory Visit (INDEPENDENT_AMBULATORY_CARE_PROVIDER_SITE_OTHER): Payer: Medicare Other | Admitting: Pharmacist

## 2011-05-23 DIAGNOSIS — Z7901 Long term (current) use of anticoagulants: Secondary | ICD-10-CM

## 2011-05-23 DIAGNOSIS — I4891 Unspecified atrial fibrillation: Secondary | ICD-10-CM

## 2011-05-23 DIAGNOSIS — I6789 Other cerebrovascular disease: Secondary | ICD-10-CM

## 2011-05-23 NOTE — Progress Notes (Signed)
Anti-Coagulation Progress Note  Kim [name] is a 77 y.o. female who is currently on an anti-coagulation regimen.    RECENT RESULTS: Recent results are below, the most recent result is correlated with a dose of 17.5 mg. per week: Lab Results  Component Value Date   INR 1.9 05/23/2011   INR 1.60 04/25/2011   INR 2.4 02/28/2011    ANTI-COAG DOSE:   Latest dosing instructions   Total Sun Mon Tue Wed Thu Fri Sat   22.5 2.5 mg 5 mg 2.5 mg 2.5 mg 5 mg 2.5 mg 2.5 mg    (5 mg0.5) (5 mg1) (5 mg0.5) (5 mg0.5) (5 mg1) (5 mg0.5) (5 mg0.5)         ANTICOAG SUMMARY: Anticoagulation Episode Summary              Current INR goal 2.0-3.0 Next INR check 06/20/2011   INR from last check 1.9! (05/23/2011)     Weekly max dose (mg)  Target end date Indefinite   Indications ATRIAL FIBRILLATION, CEREBELLAR INFARCT, Long term current use of anticoagulant   INR check location [facility] Preferred lab    Send INR reminders to [facility]   Comments        Provider Role Specialty Phone number   Blanch Media  Internal Medicine [phone]        ANTICOAG TODAY: Anticoagulation Summary as of 05/23/2011              INR goal 2.0-3.0     Selected INR 1.9! (05/23/2011) Next INR check 06/20/2011   Weekly max dose (mg)  Target end date Indefinite   Indications ATRIAL FIBRILLATION, CEREBELLAR INFARCT, Long term current use of anticoagulant    Anticoagulation Episode Summary              INR check location [facility] Preferred lab    Send INR reminders to [facility]   Comments        Provider Role Specialty Phone number   Blanch Media  Internal Medicine [phone]        PATIENT INSTRUCTIONS: Patient Instructions  Patient instructed to take medications as defined in the Anti-coagulation Track section of this encounter.  Patient instructed to take today's dose.  Patient verbalized understanding of these instructions.        FOLLOW-UP Return in 4 weeks (on  06/20/2011) for Follow up INR.  [name], [name] Pharm.D., CACP

## 2011-05-23 NOTE — Patient Instructions (Signed)
Patient instructed to take medications as defined in the Anti-coagulation Track section of this encounter.  Patient instructed to take today's dose.  Patient verbalized understanding of these instructions.    

## 2011-06-02 ENCOUNTER — Other Ambulatory Visit: Payer: Self-pay | Admitting: Internal Medicine

## 2011-06-02 ENCOUNTER — Ambulatory Visit (INDEPENDENT_AMBULATORY_CARE_PROVIDER_SITE_OTHER): Payer: Medicare Other | Admitting: Internal Medicine

## 2011-06-02 ENCOUNTER — Encounter: Payer: Self-pay | Admitting: Internal Medicine

## 2011-06-02 DIAGNOSIS — Z7409 Other reduced mobility: Secondary | ICD-10-CM

## 2011-06-02 DIAGNOSIS — R29898 Other symptoms and signs involving the musculoskeletal system: Secondary | ICD-10-CM

## 2011-06-02 DIAGNOSIS — I4891 Unspecified atrial fibrillation: Secondary | ICD-10-CM

## 2011-06-02 DIAGNOSIS — I10 Essential (primary) hypertension: Secondary | ICD-10-CM

## 2011-06-02 DIAGNOSIS — M1A9XX1 Chronic gout, unspecified, with tophus (tophi): Secondary | ICD-10-CM

## 2011-06-02 DIAGNOSIS — E785 Hyperlipidemia, unspecified: Secondary | ICD-10-CM

## 2011-06-02 MED ORDER — POTASSIUM CHLORIDE CRYS ER 20 MEQ PO TBCR: 20.0000 meq | EXTENDED_RELEASE_TABLET | Freq: Every day | ORAL | Status: DC

## 2011-06-02 MED ORDER — WARFARIN SODIUM 5 MG PO TABS: 5.0000 mg | ORAL_TABLET | ORAL | Status: DC

## 2011-06-02 NOTE — Patient Instructions (Signed)
Keep taking your medications as prescribed.  You can use your fluid pill twice for a few days to get your swelling to go down.  We will have Home Health come out to have a physical therapist and Occupational therapist evaluate you and your mobility to see if they have anything they can help you with.  The next step would be the mobility assistance with the power chair.    Follow up in the first part of September.

## 2011-06-02 NOTE — Progress Notes (Signed)
Subjective:   Patient ID: Kim [name] female   DOB: 03-Mar-1929 77 y.o.   MRN: [id number]  HPI: [name] is a 77 y.o. woman who presents to clinic today complaining of decreased mobility and problems getting around her house and neighborhood.  She has a long history of gout with significant joint damage and gouty tophi.   She has been having problems for years but noticed over the last few months that she is not moving as much and has to stop to rest in her home after going 15 feet.  She uses a single point cane most of the time and her walker with a seat others to help her get around and when she goes shopping she uses the electric wheelchairs offered.  She has been in contact with [facility] and wants to inquire about getting one for herself.  She states that she has not been to physical or occupational therapy ever for conditioning and help with her ADLs.  She has some adaptive equipment around the house.    She also states that she has had problems with chest pain for the last two weeks.  The pain is located on the left chest in the mid clavicular line around the level of the 3rd and 4th rib.  She states that the pain is constant and hurts worse with deep inhalation and palpation.  She states that it comes daily and is not correlated with activity.  It occasionally radiates to her back.  She states that she stopped taking her probenecid about 3 weeks ago except when she knows she has to go to the doctor.  She continues to take her colchicine.  She denies any diaphoresis, SOB, nausea, vomiting, syncope, skin changes, diarrhea, or constipation.   Past Medical History  Diagnosis Date  . HTN (hypertension)   . AF (atrial fibrillation)     On chronic coumadin  . Diastolic heart failure     Echo 5/11: There was moderate concentric hypertrophy. Systolic function was vigorous. The best esimated ejection fraction was in the range of 65% to 70%. Wall motion was normal  . Gout   . RA  (rheumatoid arthritis)   . Chronic renal insufficiency     Baseline approx: 1.7  . Glaucoma     No recent eye visits noted in EMR.   . Pulmonary artery hypertension     Per 5/11 echo, PA peak pressure: 69mm Hg   Current Outpatient Prescriptions  Medication Sig Dispense Refill  . cloNIDine (CATAPRES) 0.2 MG tablet Take 1 tablet (0.2 mg total) by mouth 2 (two) times daily.  180 tablet  4  . colchicine 0.6 MG tablet Take 1 tablet (0.6 mg total) by mouth 2 (two) times daily.  90 tablet  4  . diltiazem (CARDIZEM) 60 MG tablet Take 1 tablet (60 mg total) by mouth every 6 (six) hours.  120 tablet  11  . furosemide (LASIX) 40 MG tablet Take 1 tablet (40 mg total) by mouth 2 (two) times daily.  180 tablet  4  . latanoprost (XALATAN) 0.005 % ophthalmic solution Place 1 drop into both eyes daily. Use as directed  2.5 mL  12  . metoprolol (LOPRESSOR) 100 MG tablet Take 50 mg by mouth 2 (two) times daily.        . potassium chloride SA (KLOR-CON M20) 20 MEQ tablet Take 2 tablets by mouth two times a day       . ranitidine (ZANTAC) 150 MG tablet Take  150 mg by mouth daily as needed.       . warfarin (COUMADIN) 5 MG tablet Take 1 tablet (5 mg total) by mouth as directed. Take as directed  30 tablet  2   Family History  Problem Relation Age of Onset  . Diabetes Mother   . Heart disease Father    History   Social History  . Marital Status: Widowed    Spouse Name: N/A    Number of Children: N/A  . Years of Education: N/A   Occupational History  . 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    Social History Main Topics  . Smoking status: Former Games developer  . Smokeless tobacco: None  . Alcohol Use: No  . Drug Use: No  . Sexually Active: None   Other Topics Concern  . None   Social History Narrative   Financial assistance approved for 25% discount after Medicare pays for [facility] only, not eligible for [facility] card.Has [place] health care, medicare.  Lives alone at home, has 3 children in town  which she is close to and is able to rely on if needed.  High school education.    Review of Systems: Constitutional: Denies fever, chills, diaphoresis, appetite change and fatigue.  HEENT: Denies photophobia, eye pain, redness, hearing loss, ear pain, congestion, sore throat, rhinorrhea, sneezing, mouth sores, trouble swallowing, neck pain, neck stiffness and tinnitus.   Respiratory: Denies SOB, DOE, cough, chest tightness,  and wheezing.   Cardiovascular: Denies chest pain, palpitations and leg swelling.  Gastrointestinal: Denies nausea, vomiting, abdominal pain, diarrhea, constipation, blood in stool and abdominal distention.  Genitourinary: Denies dysuria, urgency, frequency, hematuria, flank pain and difficulty urinating.  Musculoskeletal: Denies myalgias, back pain, joint swelling, arthralgias and gait problem.  Skin: Denies pallor, rash and wound.  Neurological: Denies dizziness, seizures, syncope, weakness, light-headedness, numbness and headaches.  Hematological: Denies adenopathy. Easy bruising, personal or family bleeding history  Psychiatric/Behavioral: Denies suicidal ideation, mood changes, confusion, nervousness, sleep disturbance and agitation  Objective:  Physical Exam: Filed Vitals:   06/02/11 1101  BP: 166/83  Pulse: 65  Temp: 97 F (36.1 C)  TempSrc: Oral  Height: 5' 4.5" (1.638 m)  Weight: 143 lb 9.6 oz (65.137 kg)  SpO2: 98%   Constitutional: Vital signs reviewed.  Patient is an elderly, very frail appearing woman in no acute distress sitting in the wheelchair.  She is cooperative with exam. Alert and oriented x3.  Head: Normocephalic and atraumatic Ear: TM normal bilaterally Mouth: no erythema or exudates, MMM Eyes: PERRL, EOMI, conjunctivae normal, No scleral icterus.  Neck: Supple, Trachea midline normal ROM, No JVD, mass, thyromegaly, or carotid bruit present.  Cardiovascular: irregularly irregular rate and rhythm, S1 normal, S2 normal, no MRG, pulses  symmetric and intact bilaterally.  There is point tenderness in the 3rd and 4th intercostal spaces on the left chest that reproduces her pain.  No pain to palpation over the rest of her chest or sternum.   Pulmonary/Chest: CTAB, no wheezes, rales, or rhonchi Abdominal: Soft. Non-tender, non-distended, bowel sounds are normal, no masses, organomegaly, or guarding present.  GU: no CVA tenderness Musculoskeletal: no joint erythema.  There are marked deformities in her bilateral wrists, fingers, elbows, and ankles.  ROM extremely limited but nontender to palpation. She is able to stand for a very short time without unsteadiness on her feet.  When asked to ambulate she places her cane in both hands and walks with in  front of both feet.  Gait is limited by lack of strength in both legs as well as stiffness all joints.   Hematology: no cervical, inginal, or axillary adenopathy.  Neurological: A&O x3, Strength is diminished but symmetric bilaterally, cranial nerve II-XII are grossly intact, no focal motor deficit, sensory intact to light touch bilaterally.  Skin: Warm, dry and intact. No rash, cyanosis, or clubbing.  Psychiatric: restricted mood and flat affect. speech and behavior is normal. Judgment and thought content normal. Cognition and memory are normal.   Assessment & Plan:

## 2011-06-03 ENCOUNTER — Encounter: Payer: Medicare Other | Admitting: Internal Medicine

## 2011-06-06 NOTE — Assessment & Plan Note (Signed)
Her gout appears to be as controlled as it can possibly be at this point.  She is negative for RA markers.  With the advanced joint destruction on her last films she likely would need extensive surgical intervention which she is unwilling to undergo at this point.  We will manage her as best we can.

## 2011-06-06 NOTE — Assessment & Plan Note (Signed)
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  She is severely deconditioned from decreased activity for a long period of time.  She states that she has never been to physical or occupational therapy and has not been evaluated for home health aide at this time.  There is a note from 07/2010 that she had been working with Dorothe Pea to get into the [facility] but no note as to what came out of that.  We will refer to home health for now and have them send PT, OT, and evaluate her for home health aide right now to help her stay functional in her home.  She is fixated on getting a power wheel chair but I stressed to her that she should try to stay as mobile as possible and I'm afraid that if we jump straight to a power chair that she will become even more deconditioned and frail.

## 2011-06-06 NOTE — Assessment & Plan Note (Signed)
BP Readings from Last 3 Encounters:  06/02/11 166/83  03/11/11 112/69  02/21/11 108/72   Blood pressure is outside of her goal today.  She has been well controlled in the past.  On recheck it was similar and she states that she has taken her medications today.  We will recheck at her follow up and if she continues to be high we will adjust her medications.

## 2011-06-06 NOTE — Assessment & Plan Note (Signed)
Lab Results  Component Value Date   INR 1.9 05/23/2011   INR 1.60 04/25/2011   INR 2.4 02/28/2011   Her INR has been managed by Dr. Alexandria Lodge for sometime and he will continue to follow up with her.  We will refill her coumadin today.

## 2011-06-06 NOTE — Assessment & Plan Note (Signed)
Lab Results  Component Value Date   CHOL 183 05/25/2010   CHOL  Value: 163        ATP [name] CLASSIFICATION:  <200     mg/dL   Desirable  161-096  mg/dL   Borderline High  >=045    mg/dL   High        02/06/8118   CHOL  Value: 172        ATP [name] CLASSIFICATION:  <200     mg/dL   Desirable  147-829  mg/dL   Borderline High  >=562    mg/dL   High        11/03/863   Lab Results  Component Value Date   HDL 43 05/25/2010   HDL 37* 03/17/2010   HDL 46 04/06/2009   Lab Results  Component Value Date   LDLCALC 98 05/25/2010   LDLCALC  Value: 73         03/17/2010   LDLCALC  Value: 99         04/06/2009   Lab Results  Component Value Date   TRIG 211* 05/25/2010   TRIG 264* 03/17/2010   TRIG 134 04/06/2009   Lab Results  Component Value Date   CHOLHDL 4.3 Ratio 05/25/2010   CHOLHDL 4.4 03/17/2010   CHOLHDL 3.7 04/06/2009   No results found for this basename: LDLDIRECT   Her LDL is less then her goal at the last check.  She is not fasting today so we will recheck at her next appointment in September.  In the mean time we will continue her usual dose of statin.

## 2011-06-20 ENCOUNTER — Ambulatory Visit (INDEPENDENT_AMBULATORY_CARE_PROVIDER_SITE_OTHER): Payer: Medicare Other | Admitting: Pharmacist

## 2011-06-20 DIAGNOSIS — Z7901 Long term (current) use of anticoagulants: Secondary | ICD-10-CM

## 2011-06-20 DIAGNOSIS — I4891 Unspecified atrial fibrillation: Secondary | ICD-10-CM

## 2011-06-20 DIAGNOSIS — I6789 Other cerebrovascular disease: Secondary | ICD-10-CM

## 2011-06-20 NOTE — Patient Instructions (Signed)
Patient instructed to take medications as defined in the Anti-coagulation Track section of this encounter.  Patient instructed to take today's dose.  Patient verbalized understanding of these instructions.    

## 2011-06-20 NOTE — Progress Notes (Signed)
Anti-Coagulation Progress Note  Kim [name] is a 77 y.o. female who is currently on an anti-coagulation regimen.    RECENT RESULTS: Recent results are below, the most recent result is correlated with a dose of 22.5 mg. per week: Lab Results  Component Value Date   INR 3.30 06/20/2011   INR 1.9 05/23/2011   INR 1.60 04/25/2011    ANTI-COAG DOSE:   Latest dosing instructions   Total Sun Mon Tue Wed Thu Fri Sat   20 2.5 mg 2.5 mg 2.5 mg 5 mg 2.5 mg 2.5 mg 2.5 mg    (5 mg0.5) (5 mg0.5) (5 mg0.5) (5 mg1) (5 mg0.5) (5 mg0.5) (5 mg0.5)         ANTICOAG SUMMARY: Anticoagulation Episode Summary              Current INR goal 2.0-3.0 Next INR check 07/18/2011   INR from last check 3.30! (06/20/2011)     Weekly max dose (mg)  Target end date Indefinite   Indications ATRIAL FIBRILLATION, CEREBELLAR INFARCT, Long term current use of anticoagulant   INR check location [facility] Preferred lab    Send INR reminders to [facility]   Comments        Provider Role Specialty Phone number   Blanch Media  Internal Medicine [phone]        ANTICOAG TODAY: Anticoagulation Summary as of 06/20/2011              INR goal 2.0-3.0     Selected INR 3.30! (06/20/2011) Next INR check 07/18/2011   Weekly max dose (mg)  Target end date Indefinite   Indications ATRIAL FIBRILLATION, CEREBELLAR INFARCT, Long term current use of anticoagulant    Anticoagulation Episode Summary              INR check location [facility] Preferred lab    Send INR reminders to [facility]   Comments        Provider Role Specialty Phone number   Blanch Media  Internal Medicine [phone]        PATIENT INSTRUCTIONS: Patient Instructions  Patient instructed to take medications as defined in the Anti-coagulation Track section of this encounter.  Patient instructed to take today's dose.  Patient verbalized understanding of these instructions.        FOLLOW-UP Return in about  4 weeks (around 07/18/2011) for Follow up INR.  [name], [name] Pharm.D., CACP

## 2011-07-07 ENCOUNTER — Encounter: Payer: Medicare Other | Admitting: Internal Medicine

## 2011-07-18 ENCOUNTER — Ambulatory Visit: Payer: Medicare Other

## 2011-07-25 ENCOUNTER — Ambulatory Visit (INDEPENDENT_AMBULATORY_CARE_PROVIDER_SITE_OTHER): Payer: Medicare Other | Admitting: Pharmacist

## 2011-07-25 DIAGNOSIS — Z7901 Long term (current) use of anticoagulants: Secondary | ICD-10-CM

## 2011-07-25 DIAGNOSIS — I4891 Unspecified atrial fibrillation: Secondary | ICD-10-CM

## 2011-07-25 DIAGNOSIS — I6789 Other cerebrovascular disease: Secondary | ICD-10-CM

## 2011-07-25 NOTE — Progress Notes (Signed)
Anti-Coagulation Progress Note  Aleeta L Deckard is a 77 y.o. female who is currently on an anti-coagulation regimen.    RECENT RESULTS: Recent results are below, the most recent result is correlated with a dose of 20 mg. per week: Lab Results  Component Value Date   INR 2.90 07/25/2011   INR 3.30 06/20/2011   INR 1.9 05/23/2011    ANTI-COAG DOSE:   Latest dosing instructions   Total Sun Mon Tue Wed Thu Fri Sat   17.5 2.5 mg 2.5 mg 2.5 mg 2.5 mg 2.5 mg 2.5 mg 2.5 mg    (5 mg0.5) (5 mg0.5) (5 mg0.5) (5 mg0.5) (5 mg0.5) (5 mg0.5) (5 mg0.5)         ANTICOAG SUMMARY: Anticoagulation Episode Summary              Current INR goal 2.0-3.0 Next INR check 09/05/2011   INR from last check 2.90 (07/25/2011)     Weekly max dose (mg)  Target end date Indefinite   Indications ATRIAL FIBRILLATION, CEREBELLAR INFARCT, Long term current use of anticoagulant   [facility]R check location [facility] Preferred lab    Send INR reminders to [facility]   Comments        Provider Role Specialty Phone number   Blanch Media  Internal Medicine [phone]        ANTICOAG TODAY: Anticoagulation Summary as of 07/25/2011              INR goal 2.0-3.0     Selected INR 2.90 (07/25/2011) Next INR check 09/05/2011   Weekly max dose (mg)  Target end date Indefinite   Indications ATRIAL FIBRILLATION, CEREBELLAR INFARCT, Long term current use of anticoagulant    Anticoagulation Episode Summary              INR check location [facility] Preferred lab    Send INR reminders to [facility]   Comments        Provider Role Specialty Phone number   Blanch Media  Internal Medicine [phone]        PATIENT INSTRUCTIONS: Patient Instructions  Patient instructed to take medications as defined in the Anti-coagulation Track section of this encounter.  Patient instructed to take today's dose.  Patient verbalized understanding of these instructions.        FOLLOW-UP Return in 6  weeks (on 09/05/2011) for Follow up INR.  [name], [name] Pharm.D., CACP

## 2011-07-25 NOTE — Patient Instructions (Signed)
Patient instructed to take medications as defined in the Anti-coagulation Track section of this encounter.  Patient instructed to take today's dose.  Patient verbalized understanding of these instructions.    

## 2011-07-26 ENCOUNTER — Other Ambulatory Visit: Payer: Self-pay | Admitting: Internal Medicine

## 2011-08-09 LAB — URINALYSIS, ROUTINE W REFLEX MICROSCOPIC
Glucose, UA: NEGATIVE
Glucose, UA: NEGATIVE
Ketones, ur: NEGATIVE
Nitrite: NEGATIVE
Protein, ur: NEGATIVE
Specific Gravity, Urine: 1.008
pH: 6.5
pH: 5.5

## 2011-08-09 LAB — DIFFERENTIAL
Eosinophils Relative: 2
Lymphocytes Relative: 14
Lymphs Abs: 1.1
Monocytes Absolute: 0.6

## 2011-08-09 LAB — CARDIAC PANEL(CRET KIN+CKTOT+MB+TROPI)
Relative Index: INVALID
Relative Index: INVALID
Relative Index: INVALID
Total CK: 57
Troponin I: 0.01
Troponin I: 0.02

## 2011-08-09 LAB — BASIC METABOLIC PANEL
CO2: 22
Calcium: 8.3 — ABNORMAL LOW
Calcium: 8.8
Chloride: 99
Creatinine, Ser: 1.59 — ABNORMAL HIGH
Creatinine, Ser: 1.8 — ABNORMAL HIGH
GFR calc Af Amer: 38 — ABNORMAL LOW
GFR calc non Af Amer: 32 — ABNORMAL LOW
Glucose, Bld: 139 — ABNORMAL HIGH
Sodium: 143

## 2011-08-09 LAB — CBC
Hemoglobin: 13.1
MCHC: 34.4
MCHC: 33.9
MCHC: 34.4
MCV: 86.2
MCV: 87.9
Platelets: 274
Platelets: 252
RDW: 15.5
RDW: 15.8 — ABNORMAL HIGH
WBC: 7.7

## 2011-08-09 LAB — COMPREHENSIVE METABOLIC PANEL
AST: 31
Albumin: 3 — ABNORMAL LOW
Calcium: 8.8
Creatinine, Ser: 1.39 — ABNORMAL HIGH
GFR calc Af Amer: 45 — ABNORMAL LOW

## 2011-08-09 LAB — TSH: TSH: 6.547 — ABNORMAL HIGH

## 2011-08-09 LAB — URINE CULTURE
Colony Count: >=100000
Special Requests: NEGATIVE

## 2011-08-09 LAB — APTT: aPTT: 25

## 2011-08-09 LAB — URINE MICROSCOPIC-ADD ON

## 2011-09-05 ENCOUNTER — Ambulatory Visit: Payer: Medicare Other

## 2011-09-12 ENCOUNTER — Ambulatory Visit (INDEPENDENT_AMBULATORY_CARE_PROVIDER_SITE_OTHER): Payer: Medicare Other | Admitting: Pharmacist

## 2011-09-12 DIAGNOSIS — I6789 Other cerebrovascular disease: Secondary | ICD-10-CM

## 2011-09-12 DIAGNOSIS — I4891 Unspecified atrial fibrillation: Secondary | ICD-10-CM

## 2011-09-12 DIAGNOSIS — Z7901 Long term (current) use of anticoagulants: Secondary | ICD-10-CM

## 2011-09-12 NOTE — Patient Instructions (Signed)
Patient instructed to take medications as defined in the Anti-coagulation Track section of this encounter.  Patient instructed to take today's dose.  Patient verbalized understanding of these instructions.    

## 2011-09-12 NOTE — Progress Notes (Signed)
Anti-Coagulation Progress Note  Kim [name] is a 77 y.o. female who is currently on an anti-coagulation regimen.    RECENT RESULTS: Recent results are below, the most recent result is correlated with a dose of 17.5 mg. per week: Lab Results  Component Value Date   INR 2.90 09/12/2011   INR 2.90 07/25/2011   INR 3.30 06/20/2011    ANTI-COAG DOSE:   Latest dosing instructions   Total Sun Mon Tue Wed Thu Fri Sat   17.5 2.5 mg 2.5 mg 2.5 mg 2.5 mg 2.5 mg 2.5 mg 2.5 mg    (5 mg0.5) (5 mg0.5) (5 mg0.5) (5 mg0.5) (5 mg0.5) (5 mg0.5) (5 mg0.5)         ANTICOAG SUMMARY: Anticoagulation Episode Summary              Current INR goal 2.0-3.0 Next INR check 10/10/2011   INR from last check 2.90 (09/12/2011)     Weekly max dose (mg)  Target end date Indefinite   Indications ATRIAL FIBRILLATION, CEREBELLAR INFARCT, Long term current use of anticoagulant   INR check location [facility] Preferred lab    Send INR reminders to [facility]   Comments        Provider Role Specialty Phone number   Blanch Media  Internal Medicine [phone]        ANTICOAG TODAY: Anticoagulation Summary as of 09/12/2011              INR goal 2.0-3.0     Selected INR 2.90 (09/12/2011) Next INR check 10/10/2011   Weekly max dose (mg)  Target end date Indefinite   Indications ATRIAL FIBRILLATION, CEREBELLAR INFARCT, Long term current use of anticoagulant    Anticoagulation Episode Summary              INR check location [facility] Preferred lab    Send INR reminders to [facility]   Comments        Provider Role Specialty Phone number   Blanch Media  Internal Medicine [phone]        PATIENT INSTRUCTIONS: Patient Instructions  Patient instructed to take medications as defined in the Anti-coagulation Track section of this encounter.  Patient instructed to take today's dose.  Patient verbalized understanding of these instructions.         FOLLOW-UP Return in 4 weeks (on 10/10/2011) for Follow up INR.  [name], [name] Pharm.D., CACP

## 2011-10-10 ENCOUNTER — Encounter (HOSPITAL_COMMUNITY): Payer: Self-pay | Admitting: *Deleted

## 2011-10-10 ENCOUNTER — Other Ambulatory Visit: Payer: Self-pay

## 2011-10-10 ENCOUNTER — Emergency Department (HOSPITAL_COMMUNITY): Payer: Medicare Other

## 2011-10-10 ENCOUNTER — Ambulatory Visit: Payer: Medicare Other

## 2011-10-10 ENCOUNTER — Inpatient Hospital Stay (HOSPITAL_COMMUNITY)
Admission: EM | Admit: 2011-10-10 | Discharge: 2011-10-14 | DRG: 309 | Disposition: A | Discharge status: Home / Self Care | Payer: Medicare Other | Attending: Internal Medicine | Admitting: Internal Medicine

## 2011-10-10 DIAGNOSIS — I10 Essential (primary) hypertension: Secondary | ICD-10-CM | POA: Diagnosis present

## 2011-10-10 DIAGNOSIS — I5032 Chronic diastolic (congestive) heart failure: Secondary | ICD-10-CM | POA: Diagnosis present

## 2011-10-10 DIAGNOSIS — M069 Rheumatoid arthritis, unspecified: Secondary | ICD-10-CM | POA: Diagnosis present

## 2011-10-10 DIAGNOSIS — I509 Heart failure, unspecified: Secondary | ICD-10-CM | POA: Diagnosis present

## 2011-10-10 DIAGNOSIS — I4891 Unspecified atrial fibrillation: Principal | ICD-10-CM

## 2011-10-10 DIAGNOSIS — R791 Abnormal coagulation profile: Secondary | ICD-10-CM | POA: Diagnosis present

## 2011-10-10 DIAGNOSIS — Z7901 Long term (current) use of anticoagulants: Secondary | ICD-10-CM

## 2011-10-10 DIAGNOSIS — E876 Hypokalemia: Secondary | ICD-10-CM | POA: Diagnosis present

## 2011-10-10 DIAGNOSIS — E785 Hyperlipidemia, unspecified: Secondary | ICD-10-CM | POA: Diagnosis present

## 2011-10-10 DIAGNOSIS — M109 Gout, unspecified: Secondary | ICD-10-CM | POA: Diagnosis present

## 2011-10-10 DIAGNOSIS — N189 Chronic kidney disease, unspecified: Secondary | ICD-10-CM | POA: Diagnosis present

## 2011-10-10 DIAGNOSIS — M1A9XX1 Chronic gout, unspecified, with tophus (tophi): Secondary | ICD-10-CM | POA: Insufficient documentation

## 2011-10-10 DIAGNOSIS — I129 Hypertensive chronic kidney disease with stage 1 through stage 4 chronic kidney disease, or unspecified chronic kidney disease: Secondary | ICD-10-CM | POA: Diagnosis present

## 2011-10-10 HISTORY — DX: Atherosclerotic heart disease of native coronary artery without angina pectoris: I25.10

## 2011-10-10 HISTORY — DX: Cerebral infarction, unspecified: I63.9

## 2011-10-10 LAB — CBC
MCH: 30.1 pg (ref 26.0–34.0)
MCV: 87.7 fL (ref 78.0–100.0)
Platelets: 254 10*3/uL (ref 150–400)
RBC: 4.89 MIL/uL (ref 3.87–5.11)
RDW: 14.9 % (ref 11.5–15.5)

## 2011-10-10 LAB — DIFFERENTIAL
Eosinophils Absolute: 0.2 10*3/uL (ref 0.0–0.7)
Eosinophils Relative: 2 % (ref 0–5)
Lymphs Abs: 4.6 10*3/uL — ABNORMAL HIGH (ref 0.7–4.0)
Monocytes Absolute: 0.9 10*3/uL (ref 0.1–1.0)

## 2011-10-10 LAB — POCT I-STAT, CHEM 8
BUN: 23 mg/dL (ref 6–23)
Calcium, Ion: 1.03 mmol/L — ABNORMAL LOW (ref 1.12–1.32)
Chloride: 104 mEq/L (ref 96–112)
Creatinine, Ser: 1.5 mg/dL — ABNORMAL HIGH (ref 0.50–1.10)
Glucose, Bld: 118 mg/dL — ABNORMAL HIGH (ref 70–99)
Potassium: 3.4 mEq/L — ABNORMAL LOW (ref 3.5–5.1)

## 2011-10-10 LAB — COMPREHENSIVE METABOLIC PANEL
ALT: 24 U/L (ref 0–35)
Calcium: 9.1 mg/dL (ref 8.4–10.5)
Creatinine, Ser: 1.4 mg/dL — ABNORMAL HIGH (ref 0.50–1.10)
GFR calc Af Amer: 40 mL/min — ABNORMAL LOW (ref >90)
Glucose, Bld: 128 mg/dL — ABNORMAL HIGH (ref 70–99)
Sodium: 137 mEq/L (ref 135–145)
Total Protein: 7.4 g/dL (ref 6.0–8.3)

## 2011-10-10 MED ORDER — DILTIAZEM HCL 25 MG/5ML IV SOLN
INTRAVENOUS | Status: AC
  Filled 2011-10-10: qty 5

## 2011-10-10 MED ORDER — ASPIRIN 81 MG PO CHEW
324.0000 mg | CHEWABLE_TABLET | Freq: Once | ORAL | Status: DC
  Filled 2011-10-10: qty 4

## 2011-10-10 MED ORDER — DILTIAZEM HCL 50 MG/10ML IV SOLN: 10.0000 mg | Freq: Once | INTRAVENOUS | Status: DC

## 2011-10-10 MED ORDER — NITROGLYCERIN 2 % TD OINT
1.0000 [in_us] | TOPICAL_OINTMENT | Freq: Four times a day (QID) | TRANSDERMAL | Status: DC
  Administered 2011-10-10 – 2011-10-12 (×6): 1 [in_us] via TOPICAL
  Filled 2011-10-10: qty 30
  Filled 2011-10-10: qty 1

## 2011-10-10 MED ORDER — SODIUM CHLORIDE 0.9 % IV SOLN
20.0000 mL | INTRAVENOUS | Status: DC
  Administered 2011-10-10: 20 mL via INTRAVENOUS
  Administered 2011-10-11: 10 mL via INTRAVENOUS

## 2011-10-10 MED ORDER — ONDANSETRON HCL 4 MG/2ML IJ SOLN
4.0000 mg | Freq: Once | INTRAMUSCULAR | Status: AC
  Administered 2011-10-10: 4 mg via INTRAVENOUS
  Filled 2011-10-10: qty 2

## 2011-10-10 MED ORDER — DILTIAZEM HCL 100 MG IV SOLR
5.0000 mg/h | Freq: Once | INTRAVENOUS | Status: AC
  Administered 2011-10-10: 5 mg/h via INTRAVENOUS
  Filled 2011-10-10: qty 100

## 2011-10-10 NOTE — ED Provider Notes (Signed)
History     CSN: [id number] Arrival date & time: 10/10/2011 10:01 PM   First MD Initiated Contact with Patient 10/10/11 2242      Chief Complaint  Patient presents with  . Chest Pain    HPI Patient presents to the emergency room complaining of chest discomfort for the last couple of weeks. Patient states she's not been feeling well. She has been feeling nauseated but has not actually vomited. She has been feeling short of breath as well. Patient decided to come to emergency room tonight because her breathing got worse. Patient states she has arthritis and hurts her to move around she was hoping this discomfort would go away. The patient does have history of heart disease. The chest pain is uncomfortable and does not radiate. Nothing seems to make it worse. Patient states she does use oxygen at home. Past Medical History  Diagnosis Date  . HTN (hypertension)   . AF (atrial fibrillation)     On chronic coumadin  . Diastolic heart failure     Echo 5/11: There was moderate concentric hypertrophy. Systolic function was vigorous. The best esimated ejection fraction was in the range of 65% to 70%. Wall motion was normal  . Gout   . RA (rheumatoid arthritis)   . Chronic renal insufficiency     Baseline approx: 1.7  . Glaucoma     No recent eye visits noted in EMR.   . Pulmonary artery hypertension     Per 5/11 echo, PA peak pressure: 69mm Hg  . Stroke   . Coronary artery disease     Past Surgical History  Procedure Date  . Cardiac catheterization 4/02     Minimal luminal irregularities without significant focal stenosis.  Marland Kitchen Extracapsular cataract extraction with intraocular lens 11/08    Right eye, Dr. Mitzi Davenport    Family History  Problem Relation Age of Onset  . Diabetes Mother   . Heart disease Father     History  Substance Use Topics  . Smoking status: Former Games developer  . Smokeless tobacco: Not on file  . Alcohol Use: No    OB History    Grav Para Term Preterm  Abortions TAB SAB Ect Mult Living                  Review of Systems  All other systems reviewed and are negative.    Allergies  Review of patient's allergies indicates no known allergies.  Home Medications   Current Outpatient Rx  Name Route Sig Dispense Refill  . CLONIDINE HCL 0.2 MG PO TABS Oral Take 1 tablet (0.2 mg total) by mouth 2 (two) times daily. 180 tablet 4  . COLCHICINE 0.6 MG PO TABS       . DILTIAZEM HCL 60 MG PO TABS Oral Take 60 mg by mouth every 6 (six) hours.      . FUROSEMIDE 40 MG PO TABS Oral Take 40 mg by mouth 2 (two) times daily.      Marland Kitchen LATANOPROST 0.005 % OP SOLN Both Eyes Place 1 drop into both eyes daily. Use as directed     . METOPROLOL TARTRATE 100 MG PO TABS Oral Take 50 mg by mouth 2 (two) times daily.     Marland Kitchen POTASSIUM CHLORIDE CRYS CR 20 MEQ PO TBCR Oral Take 40 mEq by mouth 2 (two) times daily.      Marland Kitchen RANITIDINE HCL 150 MG PO TABS Oral Take 150 mg by mouth daily as needed.     Marland Kitchen  WARFARIN SODIUM 5 MG PO TABS Oral Take 2.5 mg by mouth as directed. Take as directed       BP 149/89  Pulse 136  Temp(Src) 97.8 F (36.6 C) (Oral)  Resp 20  SpO2 100%  Physical Exam  Nursing note and vitals reviewed. Constitutional: No distress.  HENT:  Head: Normocephalic and atraumatic.  Right Ear: External ear normal.  Left Ear: External ear normal.  Eyes: Conjunctivae are normal. Right eye exhibits no discharge. Left eye exhibits no discharge. No scleral icterus.  Neck: Neck supple. No tracheal deviation present.  Cardiovascular: Intact distal pulses.  An irregularly irregular rhythm present. Tachycardia present.   Pulmonary/Chest: Breath sounds normal. No accessory muscle usage or stridor. Not tachypneic. No respiratory distress. She has no wheezes. She has no rales.  Abdominal: Soft. Bowel sounds are normal. She exhibits no distension. There is no tenderness. There is no rebound and no guarding.  Musculoskeletal: She exhibits no edema and no tenderness.    Neurological: She is alert. She has normal strength. No sensory deficit. Cranial nerve deficit:  no gross defecits noted. She exhibits normal muscle tone. She displays no seizure activity. Coordination normal.  Skin: Skin is warm and dry. No rash noted. She is not diaphoretic.  Psychiatric: She has a normal mood and affect.    ED Course  Procedures (including critical care time)  Date: 10/10/2011  Rate: 156  Rhythm: atrial fibrillation  QRS Axis: normal  Intervals: Atrial fibrillation  ST/T Wave abnormalities: nonspecific ST/T changes  Conduction Disutrbances:none  Narrative Interpretation:   Old EKG Reviewed: ST-T wave changes are diminished since last EKG, rate is faster on current EKG   Medications  warfarin (COUMADIN) 5 MG tablet (not administered)  potassium chloride SA (K-DUR,KLOR-CON) 20 MEQ tablet (not administered)  diltiazem (CARDIZEM) 60 MG tablet (not administered)  furosemide (LASIX) 40 MG tablet (not administered)  latanoprost (XALATAN) 0.005 % ophthalmic solution (not administered)  colchicine (COLCRYS) 0.6 MG tablet (not administered)  0.9 %  sodium chloride infusion (20 mL Intravenous New Bag/Given 10/10/11 2328)  aspirin chewable tablet 324 mg (not administered)  nitroGLYCERIN (NITROGLYN) 2 % ointment 1 inch (1 inch Topical Given 10/10/11 2327)  ondansetron (ZOFRAN) injection 4 mg (4 mg Intravenous Given 10/10/11 2328)  diltiazem (CARDIZEM) 100 mg in dextrose 5 % 100 mL infusion (5 mg/hr Intravenous Bolus from Bag 10/10/11 2325)    Labs Reviewed  DIFFERENTIAL - Abnormal; Notable for the following:    Neutrophils Relative 27 (*)    Lymphocytes Relative 59 (*)    Lymphs Abs 4.6 (*)    All other components within normal limits  COMPREHENSIVE METABOLIC PANEL - Abnormal; Notable for the following:    Glucose, Bld 128 (*)    Creatinine, Ser 1.40 (*)    Albumin 3.3 (*)    AST 40 (*)    Alkaline Phosphatase 165 (*)    GFR calc non Af Amer 34 (*)    GFR calc Af  Amer 40 (*)    All other components within normal limits  POCT I-STAT, CHEM 8 - Abnormal; Notable for the following:    Potassium 3.4 (*)    Creatinine, Ser 1.50 (*)    Glucose, Bld 118 (*)    Calcium, Ion 1.03 (*)    Hemoglobin 15.6 (*)    All other components within normal limits  CBC  POCT I-STAT TROPONIN I  I-STAT TROPONIN I  PROTIME-INR  APTT  I-STAT TROPONIN I  I-STAT TROPONIN I  I-STAT  TROPONIN I  PRO B NATRIURETIC PEPTIDE   Dg Chest Portable 1 View  10/10/2011  *RADIOLOGY REPORT*  Clinical Data: Chest pain  PORTABLE CHEST - 1 VIEW  Comparison: 03/16/2010  Findings: Stable asymmetric elevation right hemidiaphragm. The cardiopericardial silhouette is enlarged. The lungs are clear without focal infiltrate, edema, pneumothorax or pleural effusion. Telemetry leads overlie the chest.  IMPRESSION: Stable.  No acute findings.  Original Report Authenticated By: ERIC A. MANSELL, M.D.    Diagnosis:  Atrial fibrillation with rapid ventricular rate   MDM  Patient presents to the emergency room with atrial fibrillation with rapid ventricular rate. She is responded to the Cardizem drip. Rate is now in the low 100s. X-ray does not suggest any signs of congestive heart failure. Her initial cardiac enzyme was normal. Patient will be admitted to the hospital for further treatment.   CRITICAL CARE Performed by: Celene Kras   Total critical care time: 30 Critical care time was exclusive of separately billable procedures and treating other patients.  Critical care was necessary to treat or prevent imminent or life-threatening deterioration.  Critical care was time spent personally by me on the following activities: development of treatment plan with patient and/or surrogate as well as nursing, discussions with consultants, evaluation of patient's response to treatment, examination of patient, obtaining history from patient or surrogate, ordering and performing treatments and interventions,  ordering and review of laboratory studies, ordering and review of radiographic studies, pulse oximetry and re-evaluation of patient's condition.     Celene Kras, MD 10/10/11 [phone]

## 2011-10-10 NOTE — ED Notes (Signed)
The pt has had chest pain for one week with vomiting  And shortness of breath.

## 2011-10-11 ENCOUNTER — Encounter (HOSPITAL_COMMUNITY): Payer: Self-pay | Admitting: *Deleted

## 2011-10-11 DIAGNOSIS — I4891 Unspecified atrial fibrillation: Secondary | ICD-10-CM | POA: Diagnosis present

## 2011-10-11 LAB — HEMOGLOBIN A1C: Hgb A1c MFr Bld: 6.2 % — ABNORMAL HIGH (ref <5.7)

## 2011-10-11 LAB — COMPREHENSIVE METABOLIC PANEL
Alkaline Phosphatase: 143 U/L — ABNORMAL HIGH (ref 39–117)
BUN: 20 mg/dL (ref 6–23)
Chloride: 103 mEq/L (ref 96–112)
Creatinine, Ser: 1.27 mg/dL — ABNORMAL HIGH (ref 0.50–1.10)
GFR calc Af Amer: 45 mL/min — ABNORMAL LOW (ref >90)
GFR calc non Af Amer: 39 mL/min — ABNORMAL LOW (ref >90)
Glucose, Bld: 87 mg/dL (ref 70–99)
Potassium: 3.6 mEq/L (ref 3.5–5.1)
Total Bilirubin: 0.7 mg/dL (ref 0.3–1.2)

## 2011-10-11 LAB — LIPID PANEL
HDL: 37 mg/dL — ABNORMAL LOW (ref >39)
LDL Cholesterol: 58 mg/dL (ref 0–99)
Total CHOL/HDL Ratio: 3.3 RATIO
Triglycerides: 130 mg/dL (ref <150)

## 2011-10-11 LAB — CBC
Hemoglobin: 13 g/dL (ref 12.0–15.0)
MCH: 29.5 pg (ref 26.0–34.0)
MCHC: 33.7 g/dL (ref 30.0–36.0)
Platelets: 250 10*3/uL (ref 150–400)
RDW: 14.8 % (ref 11.5–15.5)

## 2011-10-11 LAB — CARDIAC PANEL(CRET KIN+CKTOT+MB+TROPI)
Relative Index: INVALID (ref 0.0–2.5)
Relative Index: INVALID (ref 0.0–2.5)
Troponin I: <0.3 ng/mL (ref <0.30)

## 2011-10-11 LAB — POCT I-STAT TROPONIN I: Troponin i, poc: 0.03 ng/mL (ref 0.00–0.08)

## 2011-10-11 LAB — PROTIME-INR
INR: 6.64 (ref 0.00–1.49)
INR: 7.62 (ref 0.00–1.49)
Prothrombin Time: 65.5 seconds — ABNORMAL HIGH (ref 11.6–15.2)

## 2011-10-11 LAB — TSH: TSH: 2.328 u[IU]/mL (ref 0.350–4.500)

## 2011-10-11 LAB — PRO B NATRIURETIC PEPTIDE: Pro B Natriuretic peptide (BNP): 1328 pg/mL — ABNORMAL HIGH (ref 0–450)

## 2011-10-11 MED ORDER — DIGOXIN 125 MCG PO TABS
0.1250 mg | ORAL_TABLET | Freq: Every day | ORAL | Status: DC
  Administered 2011-10-11 – 2011-10-12 (×2): 0.125 mg via ORAL
  Filled 2011-10-11 (×3): qty 1

## 2011-10-11 MED ORDER — SODIUM CHLORIDE 0.9 % IJ SOLN: 3.0000 mL | INTRAMUSCULAR | Status: DC | PRN

## 2011-10-11 MED ORDER — ALUM & MAG HYDROXIDE-SIMETH 200-200-20 MG/5ML PO SUSP: 30.0000 mL | Freq: Four times a day (QID) | ORAL | Status: DC | PRN

## 2011-10-11 MED ORDER — FUROSEMIDE 10 MG/ML IJ SOLN
40.0000 mg | Freq: Every day | INTRAMUSCULAR | Status: DC
  Administered 2011-10-11: 40 mg via INTRAVENOUS
  Filled 2011-10-11: qty 4

## 2011-10-11 MED ORDER — SODIUM CHLORIDE 0.9 % IJ SOLN
3.0000 mL | Freq: Two times a day (BID) | INTRAMUSCULAR | Status: DC
  Administered 2011-10-11 – 2011-10-14 (×6): 3 mL via INTRAVENOUS

## 2011-10-11 MED ORDER — SODIUM CHLORIDE 0.9 % IV SOLN: 250.0000 mL | INTRAVENOUS | Status: DC | PRN

## 2011-10-11 MED ORDER — FUROSEMIDE 10 MG/ML IJ SOLN
20.0000 mg | Freq: Every day | INTRAMUSCULAR | Status: DC
  Administered 2011-10-12 – 2011-10-14 (×3): 20 mg via INTRAVENOUS
  Filled 2011-10-11 (×3): qty 2

## 2011-10-11 MED ORDER — PROMETHAZINE HCL 25 MG/ML IJ SOLN
12.5000 mg | Freq: Four times a day (QID) | INTRAMUSCULAR | Status: DC | PRN
  Administered 2011-10-11: 12.5 mg via INTRAVENOUS
  Filled 2011-10-11: qty 1

## 2011-10-11 MED ORDER — DILTIAZEM HCL ER COATED BEADS 240 MG PO CP24
240.0000 mg | ORAL_CAPSULE | Freq: Every day | ORAL | Status: DC
  Administered 2011-10-11 – 2011-10-14 (×4): 240 mg via ORAL
  Filled 2011-10-11 (×4): qty 1

## 2011-10-11 MED ORDER — DOCUSATE SODIUM 100 MG PO CAPS
100.0000 mg | ORAL_CAPSULE | Freq: Two times a day (BID) | ORAL | Status: DC
  Administered 2011-10-12 – 2011-10-14 (×5): 100 mg via ORAL
  Filled 2011-10-11 (×8): qty 1

## 2011-10-11 MED ORDER — PROMETHAZINE HCL 25 MG/ML IJ SOLN
12.5000 mg | INTRAMUSCULAR | Status: AC
  Filled 2011-10-11: qty 1

## 2011-10-11 MED ORDER — LATANOPROST 0.005 % OP SOLN
1.0000 [drp] | Freq: Every day | OPHTHALMIC | Status: DC
  Administered 2011-10-11 – 2011-10-14 (×4): 1 [drp] via OPHTHALMIC
  Filled 2011-10-11: qty 2.5

## 2011-10-11 MED ORDER — METOPROLOL TARTRATE 50 MG PO TABS
50.0000 mg | ORAL_TABLET | Freq: Two times a day (BID) | ORAL | Status: DC
  Administered 2011-10-11 (×2): 50 mg via ORAL
  Filled 2011-10-11 (×7): qty 1

## 2011-10-11 MED ORDER — POTASSIUM CHLORIDE CRYS ER 20 MEQ PO TBCR
40.0000 meq | EXTENDED_RELEASE_TABLET | Freq: Once | ORAL | Status: AC
  Administered 2011-10-11: 40 meq via ORAL
  Filled 2011-10-11: qty 2

## 2011-10-11 MED ORDER — MORPHINE SULFATE 2 MG/ML IJ SOLN
2.0000 mg | INTRAMUSCULAR | Status: DC | PRN
  Administered 2011-10-12: 2 mg via INTRAVENOUS
  Filled 2011-10-11: qty 1

## 2011-10-11 MED ORDER — BIOTENE DRY MOUTH MT LIQD
15.0000 mL | Freq: Two times a day (BID) | OROMUCOSAL | Status: DC
  Administered 2011-10-11 – 2011-10-14 (×7): 15 mL via OROMUCOSAL

## 2011-10-11 MED ORDER — HYDROCODONE-ACETAMINOPHEN 5-325 MG PO TABS
1.0000 | ORAL_TABLET | Freq: Four times a day (QID) | ORAL | Status: DC | PRN
  Administered 2011-10-11: 2 via ORAL
  Administered 2011-10-13: 1 via ORAL
  Filled 2011-10-11: qty 2
  Filled 2011-10-11: qty 1

## 2011-10-11 MED ORDER — ACETAMINOPHEN 325 MG PO TABS
650.0000 mg | ORAL_TABLET | Freq: Four times a day (QID) | ORAL | Status: DC | PRN
  Administered 2011-10-11 – 2011-10-14 (×3): 650 mg via ORAL
  Filled 2011-10-11 (×3): qty 2

## 2011-10-11 MED ORDER — DEXTROSE 5 % IV SOLN
5.0000 mg/h | INTRAVENOUS | Status: DC
  Administered 2011-10-11: 20 mg/h via INTRAVENOUS
  Administered 2011-10-12: 15 mg/h via INTRAVENOUS
  Administered 2011-10-12: 10 mg/h via INTRAVENOUS
  Filled 2011-10-11 (×4): qty 100

## 2011-10-11 MED ORDER — COLCHICINE 0.6 MG PO TABS
0.6000 mg | ORAL_TABLET | Freq: Two times a day (BID) | ORAL | Status: DC
  Administered 2011-10-11 – 2011-10-14 (×6): 0.6 mg via ORAL
  Filled 2011-10-11 (×7): qty 1

## 2011-10-11 MED ORDER — PROMETHAZINE HCL 12.5 MG PO TABS
12.5000 mg | ORAL_TABLET | Freq: Four times a day (QID) | ORAL | Status: DC | PRN
  Administered 2011-10-11: 12.5 mg via ORAL
  Filled 2011-10-11: qty 1

## 2011-10-11 MED ORDER — METOPROLOL TARTRATE 1 MG/ML IV SOLN
5.0000 mg | Freq: Once | INTRAVENOUS | Status: AC
  Administered 2011-10-11: 5 mg via INTRAVENOUS

## 2011-10-11 MED ORDER — DILTIAZEM HCL 60 MG PO TABS
60.0000 mg | ORAL_TABLET | Freq: Four times a day (QID) | ORAL | Status: DC
  Filled 2011-10-11 (×5): qty 1

## 2011-10-11 MED ORDER — METOPROLOL TARTRATE 1 MG/ML IV SOLN
INTRAVENOUS | Status: AC
  Administered 2011-10-11: 5 mg via INTRAVENOUS
  Filled 2011-10-11: qty 5

## 2011-10-11 NOTE — Consult Note (Signed)
Subjective:  Rapid heart rate. H/O Atrial fibrillation. Also has supra-therapeutic INR of 7.62. No chest pain. + weakness.  Objective:  Vital Signs in the last 24 hours: Temp:  [97.8 F (36.6 C)-98.7 F (37.1 C)] 98.4 F (36.9 C) (12/11 1129) Pulse Rate:  [52-160] 144  (12/11 1129) Cardiac Rhythm:  [-] Atrial flutter (12/11 0800) Resp:  [14-26] 18  (12/11 1129) BP: (129-159)/(65-98) 146/77 mmHg (12/11 1129) SpO2:  [98 %-100 %] 100 % (12/11 1129) Weight:  [61.6 kg (135 lb 12.9 oz)] 135 lb 12.9 oz (61.6 kg) (12/11 0400)  Physical Exam: BP Readings from Last 1 Encounters:  10/11/11 146/77    Wt Readings from Last 1 Encounters:  10/11/11 61.6 kg (135 lb 12.9 oz)    Weight change:   HEENT: [place]/AT, Eyes-Brown, PERL, EOMI, Conjunctiva-Pink, Sclera-Non-icteric Neck: No JVD, No bruit, Trachea midline. Lungs:  Clear, Bilateral. Cardiac:  Regular rhythm, normal S1 and S2, no S3.  Abdomen:  Soft, non-tender. Extremities:  No edema present. No cyanosis. No clubbing. Gouty swelling of both hands. CNS: AxOx3, Cranial nerves grossly intact, moves all 4 extremities. Right handed. Skin: Warm and dry.   Intake/Output from previous day: 12/10 0701 - 12/11 0700 In: 135 [I.V.:135] Out: 300 [Urine:300]    Lab Results: BMET    Component Value Date/Time   NA 138 10/11/2011 0630   K 3.6 10/11/2011 0630   CL 103 10/11/2011 0630   CO2 19 10/11/2011 0630   GLUCOSE 87 10/11/2011 0630   BUN 20 10/11/2011 0630   CREATININE 1.27* 10/11/2011 0630   CREATININE 1.75* 12/28/2010 1114   CALCIUM 8.8 10/11/2011 0630   GFRNONAA 39* 10/11/2011 0630   GFRAA 45* 10/11/2011 0630   CBC    Component Value Date/Time   WBC 6.9 10/11/2011 0630   RBC 4.40 10/11/2011 0630   HGB 13.0 10/11/2011 0630   HCT 38.6 10/11/2011 0630   PLT 250 10/11/2011 0630   MCV 87.7 10/11/2011 0630   MCH 29.5 10/11/2011 0630   MCHC 33.7 10/11/2011 0630   RDW 14.8 10/11/2011 0630   LYMPHSABS 4.6* 10/10/2011 2234   MONOABS  0.9 10/10/2011 2234   EOSABS 0.2 10/10/2011 2234   BASOSABS 0.1 10/10/2011 2234   CARDIAC ENZYMES Lab Results  Component Value Date   CKTOTAL 64 10/11/2011   CKMB 2.6 10/11/2011   TROPONINI <0.30 10/11/2011    Assessment/Plan:  Patient Active Hospital Problem List: Atrial fibrillation with RVR (10/11/2011)   Assessment: Stable post IV metoprolol   Plan:PO metoprolol and cardizem HYPERLIPIDEMIA (02/06/2007)   Assessment: Stable   Plan: medical treatment HYPERTENSION (02/06/2007)   Assessment: Stable.   Plan: Medical treatment    LOS: 1 day    Orpah Cobb  MD  10/11/2011, 12:10 PM

## 2011-10-11 NOTE — H&P (Signed)
Hospital Admission Note Date: 10/11/2011  Patient name: Kim [name] [facility] Medical record number: [id number] Date of birth: [birth year] Age: 77 y.o. Gender: female PCP: PRIBULA,CHRISTOPHER, MD  Medical Service: Internal Medicine Teaching Service  Attending physician:  Tilford Pillar  1st Contact: Lorretta Harp     Pager: [phone]  2nd Contact: Johnette Abraham   Pager: [phone] After 5 pm or weekends: 1st Contact:      Pager: [phone] 2nd Contact:      Pager: [phone]  Chief Complaint: Chest pain, palpitations  History of Present Illness: The patient is an 77 yo woman, history of atrial fibrillation, HTN, HL, gout, and CRI, presenting with chest pain.  About 1 week prior to admission, the patient notes the development of palpitations, shortness of breath, and chest pain, described as sternal, radiating to her left chest, associated with nausea and SOB, which comes and goes many times throughout the day, and feels similar to her prior episodes of atrial fibrillation.  Her symptoms have mildly worsened over the last week, prompting her to seek medical attention.  She also notes nausea, and mild headaches for the last week (similar to her chronic headaches).  She has been on home O2 at 2L [place] for the last 2 years.  The patient also notes symptoms of bilateral foot, knee, and hip pain for the last week, which she attributes to gout of OA.  She notes decreased PO intake over the last week, due to decreased appetite with her chest discomfort.  She notes no abdominal pain, vomiting, constipation, or fevers.  On arrival to the ED, the patient was found to be in afib with RVR.  Meds: Medications Prior to Admission  Medication Sig Dispense Refill  . cloNIDine (CATAPRES) 0.2 MG tablet Take 1 tablet (0.2 mg total) by mouth 2 (two) times daily.  180 tablet  4  . colchicine (COLCRYS) 0.6 MG tablet        . diltiazem (CARDIZEM) 60 MG tablet Take 60 mg by mouth every 6 (six) hours.        . furosemide  (LASIX) 40 MG tablet Take 40 mg by mouth 2 (two) times daily.        Marland Kitchen latanoprost (XALATAN) 0.005 % ophthalmic solution Place 1 drop into both eyes daily. Use as directed       . metoprolol (LOPRESSOR) 100 MG tablet Take 50 mg by mouth 2 (two) times daily.       . potassium chloride SA (K-DUR,KLOR-CON) 20 MEQ tablet Take 40 mEq by mouth 2 (two) times daily.        . ranitidine (ZANTAC) 150 MG tablet Take 150 mg by mouth daily as needed.       . warfarin (COUMADIN) 5 MG tablet Take 2.5 mg by mouth as directed. Take as directed         Allergies: Review of patient's allergies indicates no known allergies.  Past Medical History  Diagnosis Date  . HTN (hypertension)   . AF (atrial fibrillation)     On chronic coumadin  . Diastolic heart failure     Echo 5/11: There was moderate concentric hypertrophy. Systolic function was vigorous. The best esimated ejection fraction was in the range of 65% to 70%. Wall motion was normal  . Gout   . RA (rheumatoid arthritis)   . Chronic renal insufficiency     Baseline approx: 1.7  . Glaucoma     No recent eye visits noted in EMR.   . Pulmonary artery hypertension  Per 5/11 echo, PA peak pressure: 69mm Hg  . Stroke   . Coronary artery disease    Past Surgical History  Procedure Date  . Cardiac catheterization 4/02     Minimal luminal irregularities without significant focal stenosis.  Marland Kitchen Extracapsular cataract extraction with intraocular lens 11/08    Right eye, Dr. Mitzi Davenport   Family History  Problem Relation Age of Onset  . Diabetes Mother   . Heart disease Father    History   Occupational History  . 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    Social History Main Topics  . Smoking status: Former Games developer  . Smokeless tobacco: Not on file  . Alcohol Use: No  . Drug Use: No  . Sexually Active: Not on file   Social History Narrative   Financial assistance approved for 25% discount after Medicare pays for [facility] only, not  eligible for [facility] card.Has [place] health care, medicare.  Lives alone at home, has 3 children in town which she is close to and is able to rely on if needed.  High school education.     Review of Systems: General: no fevers, chills, changes in weight Skin: no rash HEENT: no blurry vision, hearing changes, sore throat Pulm: no coughing, wheezing CV: see HPI Abd: +chronic occasional diarrhea 2/2 colchicine, no abdominal pain, nausea/vomiting, constipation GU: no dysuria, hematuria, polyuria Neuro: no weakness, numbness, or tingling  Physical Exam: T: 97.8,  BP: 136/98,  HR: 160,  RR: 20,  O2 sat: 98% on 2L [place] General: alert, cooperative, and in no apparent distress HEENT: pupils equal round and reactive to light, vision grossly intact, oropharynx clear and non-erythematous  Neck: supple, no lymphadenopathy, no JVD Lungs: clear to ascultation bilaterally, normal work of respiration, no wheezes, rales, ronchi Heart: tachycardic, irregular rhythm, no murmurs, gallops, or rubs Abdomen: soft, non-tender, non-distended, normal bowel sounds Msk: Left knee tender to palpation, passive ROM limited by pain, no warmth, edema, or erythema.  Right knee less ttp, greater ROM. Extremities: no cyanosis, clubbing, or edema Neurologic: alert & oriented X3, cranial nerves II-XII intact, strength grossly intact, sensation intact to light touch  Lab results: Basic Metabolic Panel:  Basename 10/10/11 2248 10/10/11 2234  NA 139 137  K 3.4* 3.5  CL 104 100  CO2 -- 21  GLUCOSE 118* 128*  BUN 23 22  CREATININE 1.50* 1.40*  CALCIUM -- 9.1  MG -- --  PHOS -- --   Liver Function Tests:  [facility] 10/10/11 2234  AST 40*  ALT 24  ALKPHOS 165*  BILITOT 0.8  PROT 7.4  ALBUMIN 3.3*   CBC:  Basename 10/10/11 2248 10/10/11 2234  WBC -- 7.8  NEUTROABS -- 2.1  HGB 15.6* 14.7  HCT 46.0 42.9  MCV -- 87.7  PLT -- 254   Coagulation:  Basename 10/10/11 2255  LABPROT 58.8*  INR 6.64*   Imaging  results:  Dg Chest Portable 1 View  10/10/2011  *RADIOLOGY REPORT*  Clinical Data: Chest pain  PORTABLE CHEST - 1 VIEW  Comparison: 03/16/2010  Findings: Stable asymmetric elevation right hemidiaphragm. The cardiopericardial silhouette is enlarged. The lungs are clear without focal infiltrate, edema, pneumothorax or pleural effusion. Telemetry leads overlie the chest.  IMPRESSION: Stable.  No acute findings.  Original Report Authenticated By: ERIC A. MANSELL, M.D.   Echo 03/17/10 Study Conclusions - Left ventricle: The cavity size was normal. There was moderate concentric hypertrophy. Systolic function was vigorous. The estimated  ejection fraction was in the range of 65% to 70%. Wall motion was normal; there were no regional wall motion abnormalities. - Mitral valve: Calcified annulus. Mild regurgitation. - Right ventricle: The estimated peak pressure was 70mm Hg. - Pulmonary arteries: PA peak pressure: 69mm Hg (S). Transthoracic echocardiography. M-mode, limited 2D, limited spectral Doppler, and color Doppler. Height: Height: 162.6cm. Height: 64in. Weight: Weight: 67.1kg. Weight: 147.6lb. Body mass index: BMI: 25.4kg/m^2. Body surface area: BSA: 1.31m^2. Patient status: Inpatient. Location: Echo laboratory.  Other results: EKG: tachycardic, atrial fibrillation, no ST changes  Assessment & Plan by Problem: The patient is an 77 yo woman, history of atrial fibrillation, and gout, presenting with chest pain and palpitations, with afib seen on EKG.  1. Atrial fibrillation with RVR: Patient has a known history of atrial fibrillation and is on chronic Coumadin, metoprolol and Cardizem at home.  She presents to the ER with symptoms of chest discomfort associated with nausea and palpitations, and was found to be in atrial fibrillation with RVR( HR in 160's). She was started on Cardizem drip. Her CHADS2 score is 4 ( ? cerebellar infarct on CT- head seen in MRI in 10/09). Her INR at presentation was  6.64. Unsuccessful attempted electrical cardioversion in 04/11. Her last echo from 05/11 was reviewed with EF- 65-70% and no regional wall motion abnormalities. -Admit to step down unit. -Cycle CE to rule out ACS, check TSH, serum Mg levels , ( with goal of K -4, Mg-2.0). -Titrate Cardizem gtt. -Continue home dose of metoprolol -No anticoagulation at this time with supratherapeutic INR of 6.64. Recheck INR in AM. -May consider repeating a 2D echo and cards consult in AM.  2. Hypertension: BP slightly elevated with systolic's in 140-150's. -Continue home meds and diltiazem gtt.  3. Diastolic CHF: Currently stable. No signs of fluid overload on exam. -Strict I and O's , daily weights. -Continue medical management - BB- blocker, ASA. -continue lasix at 40 daily (1/2 home dose)  4. Chronic renal insufficiency: Baseline Cr-1.4- 1.5.  Her current Cr is close to the baseline. -Continue to monitor.  5. Hypokalemia: likely 2/2 diuretics. -replete -check serum Mg.   6. Risk stratification; FLP, A1C  7. Rheumatoid arthritis and gout: She has evidence of classic ulnar deviation on exam. No signs of gout flare on exam. -Symptomatic treatment with pain meds. -continue gout -patient notes that she takes benecar, but this medication is not on her home list  8.DVT: SCD's, supratherapeutic INR  Signed: Janalyn Harder 10/11/2011, 1:53 AM

## 2011-10-11 NOTE — H&P (Signed)
Internal Medicine Teaching Service Attending Note Date: 10/11/2011  Patient name: Kim [name]  Medical record number: [id number]  Date of birth: 08/05/1929   I have seen and evaluated Athziri L Oleski and discussed their care with the Residency Team.   Patient with 1 week history of palpitations that she was hoping would "go away on their own."   Physical Exam: Blood pressure 146/77, pulse 144, temperature 98.4 F (36.9 C), temperature source Oral, resp. rate 18, height 5\' 4"  (1.626 m), weight 135 lb 12.9 oz (61.6 kg), SpO2 100.00%. Monitor shows atrial flutter 2:1 block about 145-150. Patient laying in bed, very sweet Neck-no JVD Lungs-clear Heart-irr irr-reg alternating, very tachycardic Abdomen-+BS, non-tender Extrem-no edema Neuro-non-focal  Monitor last night-10 beat run of (probable) VT  Lab results: Results for orders placed during the hospital encounter of 10/10/11 (from the past 24 hour(s))  CBC     Status: Normal   Collection Time   10/10/11 10:34 PM      Component Value Range   WBC 7.8  4.0 - 10.5 (K/uL)   RBC 4.89  3.87 - 5.11 (MIL/uL)   Hemoglobin 14.7  12.0 - 15.0 (g/dL)   HCT 40.9  81.1 - 91.4 (%)   MCV 87.7  78.0 - 100.0 (fL)   MCH 30.1  26.0 - 34.0 (pg)   MCHC 34.3  30.0 - 36.0 (g/dL)   RDW 78.2  95.6 - 21.3 (%)   Platelets 254  150 - 400 (K/uL)  DIFFERENTIAL     Status: Abnormal   Collection Time   10/10/11 10:34 PM      Component Value Range   Neutrophils Relative 27 (*) 43 - 77 (%)   Neutro Abs 2.1  1.7 - 7.7 (K/uL)   Lymphocytes Relative 59 (*) 12 - 46 (%)   Lymphs Abs 4.6 (*) 0.7 - 4.0 (K/uL)   Monocytes Relative 12  3 - 12 (%)   Monocytes Absolute 0.9  0.1 - 1.0 (K/uL)   Eosinophils Relative 2  0 - 5 (%)   Eosinophils Absolute 0.2  0.0 - 0.7 (K/uL)   Basophils Relative 1  0 - 1 (%)   Basophils Absolute 0.1  0.0 - 0.1 (K/uL)  COMPREHENSIVE METABOLIC PANEL     Status: Abnormal   Collection Time   10/10/11 10:34 PM      Component  Value Range   Sodium 137  135 - 145 (mEq/L)   Potassium 3.5  3.5 - 5.1 (mEq/L)   Chloride 100  96 - 112 (mEq/L)   CO2 21  19 - 32 (mEq/L)   Glucose, Bld 128 (*) 70 - 99 (mg/dL)   BUN 22  6 - 23 (mg/dL)   Creatinine, Ser 0.86 (*) 0.50 - 1.10 (mg/dL)   Calcium 9.1  8.4 - 57.8 (mg/dL)   Total Protein 7.4  6.0 - 8.3 (g/dL)   Albumin 3.3 (*) 3.5 - 5.2 (g/dL)   AST 40 (*) 0 - 37 (U/L)   ALT 24  0 - 35 (U/L)   Alkaline Phosphatase 165 (*) 39 - 117 (U/L)   Total Bilirubin 0.8  0.3 - 1.2 (mg/dL)   GFR calc non Af Amer 34 (*) >90 (mL/min)   GFR calc Af Amer 40 (*) >90 (mL/min)  POCT I-STAT, CHEM 8     Status: Abnormal   Collection Time   10/10/11 10:48 PM      Component Value Range   Sodium 139  135 - 145 (mEq/L)   Potassium  3.4 (*) 3.5 - 5.1 (mEq/L)   Chloride 104  96 - 112 (mEq/L)   BUN 23  6 - 23 (mg/dL)   Creatinine, Ser 1.61 (*) 0.50 - 1.10 (mg/dL)   Glucose, Bld 096 (*) 70 - 99 (mg/dL)   Calcium, Ion 0.45 (*) 1.12 - 1.32 (mmol/L)   TCO2 20  0 - 100 (mmol/L)   Hemoglobin 15.6 (*) 12.0 - 15.0 (g/dL)   HCT 40.9  81.1 - 91.4 (%)  PROTIME-INR     Status: Abnormal   Collection Time   10/10/11 10:55 PM      Component Value Range   Prothrombin Time 58.8 (*) 11.6 - 15.2 (seconds)   INR 6.64 (*) 0.00 - 1.49   APTT     Status: Abnormal   Collection Time   10/10/11 10:55 PM      Component Value Range   aPTT 70 (*) 24 - 37 (seconds)  POCT I-STAT TROPONIN I     Status: Normal   Collection Time   10/10/11 10:58 PM      Component Value Range   Troponin i, poc 0.03  0.00 - 0.08 (ng/mL)   Comment 3           PRO B NATRIURETIC PEPTIDE     Status: Abnormal   Collection Time   10/10/11 11:50 PM      Component Value Range   Pro B Natriuretic peptide (BNP) 1328.0 (*) 0 - 450 (pg/mL)  POCT I-STAT TROPONIN I     Status: Normal   Collection Time   10/11/11 12:06 AM      Component Value Range   Troponin i, poc 0.03  0.00 - 0.08 (ng/mL)   Comment 3           POCT I-STAT TROPONIN I      Status: Normal   Collection Time   10/11/11  3:10 AM      Component Value Range   Troponin i, poc 0.03  0.00 - 0.08 (ng/mL)   Comment 3           MRSA PCR SCREENING     Status: Normal   Collection Time   10/11/11  4:07 AM      Component Value Range   MRSA by PCR NEGATIVE  NEGATIVE   COMPREHENSIVE METABOLIC PANEL     Status: Abnormal   Collection Time   10/11/11  6:30 AM      Component Value Range   Sodium 138  135 - 145 (mEq/L)   Potassium 3.6  3.5 - 5.1 (mEq/L)   Chloride 103  96 - 112 (mEq/L)   CO2 19  19 - 32 (mEq/L)   Glucose, Bld 87  70 - 99 (mg/dL)   BUN 20  6 - 23 (mg/dL)   Creatinine, Ser 7.82 (*) 0.50 - 1.10 (mg/dL)   Calcium 8.8  8.4 - 95.6 (mg/dL)   Total Protein 6.3  6.0 - 8.3 (g/dL)   Albumin 2.9 (*) 3.5 - 5.2 (g/dL)   AST 35  0 - 37 (U/L)   ALT 21  0 - 35 (U/L)   Alkaline Phosphatase 143 (*) 39 - 117 (U/L)   Total Bilirubin 0.7  0.3 - 1.2 (mg/dL)   GFR calc non Af Amer 39 (*) >90 (mL/min)   GFR calc Af Amer 45 (*) >90 (mL/min)  PROTIME-INR     Status: Abnormal   Collection Time   10/11/11  6:30 AM      Component Value  Range   Prothrombin Time 65.5 (*) 11.6 - 15.2 (seconds)   INR 7.62 (*) 0.00 - 1.49   CBC     Status: Normal   Collection Time   10/11/11  6:30 AM      Component Value Range   WBC 6.9  4.0 - 10.5 (K/uL)   RBC 4.40  3.87 - 5.11 (MIL/uL)   Hemoglobin 13.0  12.0 - 15.0 (g/dL)   HCT 46.9  62.9 - 52.8 (%)   MCV 87.7  78.0 - 100.0 (fL)   MCH 29.5  26.0 - 34.0 (pg)   MCHC 33.7  30.0 - 36.0 (g/dL)   RDW 41.3  24.4 - 01.0 (%)   Platelets 250  150 - 400 (K/uL)  PRO B NATRIURETIC PEPTIDE     Status: Abnormal   Collection Time   10/11/11  6:30 AM      Component Value Range   Pro B Natriuretic peptide (BNP) 1217.0 (*) 0 - 450 (pg/mL)  MAGNESIUM     Status: Abnormal   Collection Time   10/11/11  6:30 AM      Component Value Range   Magnesium 1.3 (*) 1.5 - 2.5 (mg/dL)  LIPID PANEL     Status: Abnormal   Collection Time   10/11/11  6:30 AM       Component Value Range   Cholesterol 121  0 - 200 (mg/dL)   Triglycerides 272  <536 (mg/dL)   HDL 37 (*) >64 (mg/dL)   Total CHOL/HDL Ratio 3.3     VLDL 26  0 - 40 (mg/dL)   LDL Cholesterol 58  0 - 99 (mg/dL)  CARDIAC PANEL(CRET KIN+CKTOT+MB+TROPI)     Status: Normal   Collection Time   10/11/11  8:14 AM      Component Value Range   Total CK 64  7 - 177 (U/L)   CK, MB 2.6  0.3 - 4.0 (ng/mL)   Troponin I <0.30  <0.30 (ng/mL)   Relative Index RELATIVE INDEX IS INVALID  0.0 - 2.5     Imaging results:  Dg Chest Portable 1 View  10/10/2011  *RADIOLOGY REPORT*  Clinical Data: Chest pain  PORTABLE CHEST - 1 VIEW  Comparison: 03/16/2010  Findings: Stable asymmetric elevation right hemidiaphragm. The cardiopericardial silhouette is enlarged. The lungs are clear without focal infiltrate, edema, pneumothorax or pleural effusion. Telemetry leads overlie the chest.  IMPRESSION: Stable.  No acute findings.  Original Report Authenticated By: ERIC A. MANSELL, M.D.    Assessment and Plan: I agree with the formulated Assessment and Plan with the following changes: Patient in flutter which may be harder to get her out of. Will consult her cardiologists. History given to me that she was cardioverted in the past unsuccessfully.  She says her INR was therapeutic 4 weeks ago.  Though BNP is up she does not appear wet on CXR or clinically.

## 2011-10-11 NOTE — Plan of Care (Signed)
Problem: Phase I Progression Outcomes Goal: Anticoagulation Therapy per MD order Outcome: Not Applicable Date Met:  10/11/11 INR currently 7.2. Will reorder before discharge. Goal: Initial discharge plan identified Outcome: Progressing To return home with son and family

## 2011-10-11 NOTE — Progress Notes (Signed)
Subjective:  Patient reports that her chest pain is minimal now. But has mild headache. Patient had an episode of V-tach in the early morning.  Objective: Vital signs in last 24 hours: Filed Vitals:   10/11/11 1000 10/11/11 1059 10/11/11 1100 10/11/11 1129  BP: 146/79   146/77  Pulse: 141 143 143 144  Temp:    98.4 F (36.9 C)  TempSrc:    Oral  Resp: 19 19 26 18   Height:      Weight:      SpO2: 100% 99% 100% 100%   Weight change:   Intake/Output Summary (Last 24 hours) at 10/11/11 1147 Last data filed at 10/11/11 1130  Gross per 24 hour  Intake    235 ml  Output    500 ml  Net   -265 ml   Physical Exam:  General: alert, cooperative, and in no apparent distress HEENT: pupils equal round and reactive to light, vision grossly intact, oropharynx clear and non-erythematous  Neck: supple, no lymphadenopathy, no JVD Lungs: clear to ascultation bilaterally, normal work of respiration, no wheezes, rales, ronchi Heart: tachycardic, irregular rhythm, no murmurs, gallops, or rubs Abdomen: soft, non-tender, non-distended, normal bowel sounds Msk: Left knee tender to palpation, passive ROM limited by pain, no warmth, edema, or erythema.  Right knee less ttp, greater ROM. Extremities: no cyanosis, clubbing, or edema Neurologic: alert & oriented X3, cranial nerves II-XII intact, strength grossly intact, sensation intact to light touch    Lab Results: Basic Metabolic Panel:  Lab 10/11/11 1610 10/10/11 2248 10/10/11 2234  NA 138 139 --  K 3.6 3.4* --  CL 103 104 --  CO2 19 -- 21  GLUCOSE 87 118* --  BUN 20 23 --  CREATININE 1.27* 1.50* --  CALCIUM 8.8 -- 9.1  MG 1.3* -- --  PHOS -- -- --   Liver Function Tests:  Lab 10/11/11 0630 10/10/11 2234  AST 35 40*  ALT 21 24  ALKPHOS 143* 165*  BILITOT 0.7 0.8  PROT 6.3 7.4  ALBUMIN 2.9* 3.3*   CBC:  Lab 10/11/11 0630 10/10/11 2248 10/10/11 2234  WBC 6.9 -- 7.8  NEUTROABS -- -- 2.1  HGB 13.0 15.6* --  HCT 38.6 46.0 --    MCV 87.7 -- 87.7  PLT 250 -- 254   Cardiac Enzymes:  Lab 10/11/11 0814  CKTOTAL 64  CKMB 2.6  CKMBINDEX --  TROPONINI <0.30   :  Lab 10/11/11 0630  CHOL 121  HDL 37*  LDLCALC 58  TRIG 960  CHOLHDL 3.3  LDLDIRECT --   Thyroid Function Tests: No results found for this basename: TSH,T4TOTAL,FREET4,T3FREE,THYROIDAB in the last 168 hours Coagulation:  Lab 10/11/11 0630 10/10/11 2255  LABPROT 65.5* 58.8*  INR 7.62* 6.64*    Drugs of Abuse     Component Value Date/Time   LABOPIA NONE DETECTED 01/26/2010 1130   COCAINSCRNUR NONE DETECTED 01/26/2010 1130   LABBENZ POSITIVE* 01/26/2010 1130   AMPHETMU NONE DETECTED 01/26/2010 1130   THCU NONE DETECTED 01/26/2010 1130   LABBARB  Value: NONE DETECTED        DRUG SCREEN FOR MEDICAL PURPOSES ONLY.  IF CONFIRMATION IS NEEDED FOR ANY PURPOSE, NOTIFY LAB WITHIN 5 DAYS.        LOWEST DETECTABLE LIMITS FOR URINE DRUG SCREEN Drug Class       Cutoff (ng/mL) Amphetamine      1000 Barbiturate      200 Benzodiazepine   200 Tricyclics  300 Opiates          300 Cocaine          300 THC              50 01/26/2010 1130     Micro Results: Recent Results (from the past 240 hour(s))  MRSA PCR SCREENING     Status: Normal   Collection Time   10/11/11  4:07 AM      Component Value Range Status Comment   MRSA by PCR NEGATIVE  NEGATIVE  Final    Studies/Results: Dg Chest Portable 1 View  10/10/2011  *RADIOLOGY REPORT*  Clinical Data: Chest pain  PORTABLE CHEST - 1 VIEW  Comparison: 03/16/2010  Findings: Stable asymmetric elevation right hemidiaphragm. The cardiopericardial silhouette is enlarged. The lungs are clear without focal infiltrate, edema, pneumothorax or pleural effusion. Telemetry leads overlie the chest.  IMPRESSION: Stable.  No acute findings.  Original Report Authenticated By: ERIC A. MANSELL, M.D.   Medications:  Scheduled Meds:   . antiseptic oral rinse  15 mL Mouth Rinse q12n4p  . aspirin  324 mg Oral Once  . diltiazem  (CARDIZEM) infusion  5-15 mg/hr Intravenous Once  . docusate sodium  100 mg Oral BID  . furosemide  20 mg Intravenous Daily  . latanoprost  1 drop Both Eyes Daily  . metoprolol  50 mg Oral BID  . nitroGLYCERIN  1 inch Topical Q6H  . ondansetron  4 mg Intravenous Once  . potassium chloride  40 mEq Oral Once  . promethazine  12.5 mg Intravenous To Major  . sodium chloride  3 mL Intravenous Q12H  . DISCONTD: diltiazem  10 mg Intravenous Once  . DISCONTD: diltiazem  60 mg Oral Q6H  . DISCONTD: furosemide  40 mg Intravenous Daily   Continuous Infusions:   . sodium chloride 20 mL (10/10/11 2328)  . diltiazem (CARDIZEM) infusion 15 mg/hr (10/11/11 0934)   PRN Meds:.sodium chloride, acetaminophen, alum & mag hydroxide-simeth, morphine, promethazine, promethazine, sodium chloride Assessment/Plan:  1. Atrial fibrillation with RVR: Patient has a known history of atrial fibrillation and is on chronic Coumadin, metoprolol and Cardizem at home.  She presents to the ER with symptoms of chest discomfort associated with nausea and palpitations, and was found to be in atrial fibrillation with RVR( HR in 160's). She was started on Cardizem drip. Her CHADS2 score is 4 ( ? cerebellar infarct on CT- head seen in MRI in 10/09).  Unsuccessful attempted electrical cardioversion in 04/11. Her last echo from 05/11 was reviewed with EF- 65-70% and no regional wall motion abnormalities. Her INR at presentation was 6.64. This AM is 7.6. Patient does not have bleeding tendency. Patient is getting Cardizem drip at 15 mg/hour now. HR is 143/min. Troponin is negative X 3.  Plan:  -will get cardiology consult and follow up the recommendation. -will continue cardiacdrip and oral Metoprolol. -pending SH, serum Mg levels , ( with goal of K -4, Mg-2.0).  2. Hypertension: BP slightly elevated with systolic's in 140-150's. -Continue home meds and diltiazem gtt.  3. Diastolic CHF: Currently stable. No signs of fluid overload  on exam. -Strict I and O's , daily weights. -Continue medical management - BB- blocker, ASA. -decrease lasix at to 20 daily  4. Chronic renal insufficiency: Baseline Cr-1.4- 1.5.  Her current Cr is close to the baseline. -Continue to monitor.  5. Hypokalemia: likely 2/2 diuretics. -replete -check serum Mg.   6. Risk stratification; FLP, A1C  7. Rheumatoid arthritis and  gout: She has evidence of classic ulnar deviation on exam. No signs of gout flare on exam. -Symptomatic treatment with pain meds. -continue gout -patient notes that she takes benecar, but this medication is not on her home list   8. upratherapeutic INR: INR 7.6 this AM. No bleeding tendency. -will hold coumadin and no anticoagulation at this time with supratherapeutic INR. - Recheck INR at AM lab  9 .DVT: SCD's, supratherapeutic INR      LOS: 1 day   Lorretta Harp 10/11/2011, 11:47 AM

## 2011-10-11 NOTE — Progress Notes (Signed)
Pt. Had 11 beat run of V. Tach @ 0530.  Dr. Manson Passey notified at [phone].  Pt. Resting quietly, no signs or symptoms of distress. Cardizem drip at 15 mg/hr. VSS. Continue to monitor. No new orders at this time.

## 2011-10-12 ENCOUNTER — Other Ambulatory Visit: Payer: Self-pay

## 2011-10-12 LAB — BASIC METABOLIC PANEL
CO2: 23 mEq/L (ref 19–32)
GFR calc non Af Amer: 33 mL/min — ABNORMAL LOW (ref >90)
Glucose, Bld: 100 mg/dL — ABNORMAL HIGH (ref 70–99)
Potassium: 3.7 mEq/L (ref 3.5–5.1)
Sodium: 137 mEq/L (ref 135–145)

## 2011-10-12 LAB — CARDIAC PANEL(CRET KIN+CKTOT+MB+TROPI)
Total CK: 73 U/L (ref 7–177)
Troponin I: <0.3 ng/mL (ref <0.30)

## 2011-10-12 LAB — PROTIME-INR
INR: 9.76 (ref 0.00–1.49)
Prothrombin Time: 79.5 seconds — ABNORMAL HIGH (ref 11.6–15.2)

## 2011-10-12 MED ORDER — PHYTONADIONE 5 MG PO TABS
5.0000 mg | ORAL_TABLET | Freq: Once | ORAL | Status: AC
  Administered 2011-10-12: 5 mg via ORAL
  Filled 2011-10-12: qty 1

## 2011-10-12 MED ORDER — PREDNISONE 20 MG PO TABS: 20.0000 mg | ORAL_TABLET | Freq: Every day | ORAL | Status: DC

## 2011-10-12 MED ORDER — METOPROLOL TARTRATE 100 MG PO TABS
100.0000 mg | ORAL_TABLET | Freq: Two times a day (BID) | ORAL | Status: DC
  Administered 2011-10-12: 100 mg via ORAL
  Filled 2011-10-12 (×2): qty 1

## 2011-10-12 MED ORDER — PREDNISONE 20 MG PO TABS
40.0000 mg | ORAL_TABLET | Freq: Every day | ORAL | Status: DC
  Administered 2011-10-12 – 2011-10-14 (×3): 40 mg via ORAL
  Filled 2011-10-12 (×4): qty 2

## 2011-10-12 NOTE — Consult Note (Signed)
Subjective:  Rapid heart rate off and on. H/O Atrial fibrillation. Also has supra-therapeutic INR of 9.76. No chest pain. + weakness.  Objective:  Vital Signs in the last 24 hours: Temp:  [98.1 F (36.7 C)-98.5 F (36.9 C)] 98.5 F (36.9 C) (12/12 0814) Pulse Rate:  [46-144] 103  (12/12 0814) Cardiac Rhythm:  [-] Atrial flutter (12/12 0700) Resp:  [12-27] 23  (12/12 0814) BP: (117-167)/(56-85) 132/79 mmHg (12/12 0814) SpO2:  [92 %-100 %] 95 % (12/12 0814) Weight:  [65.2 kg (143 lb 11.8 oz)] 143 lb 11.8 oz (65.2 kg) (12/11 2357)  Physical Exam: BP Readings from Last 1 Encounters:  10/12/11 132/79    Wt Readings from Last 1 Encounters:  10/11/11 65.2 kg (143 lb 11.8 oz)    Weight change: 3.6 kg (7 lb 15 oz)  HEENT: [place]/AT, Eyes-Brown, PERL, EOMI, Conjunctiva-Pink, Sclera-Non-icteric Neck: No JVD, No bruit, Trachea midline. Lungs:  Clear, Bilateral. Cardiac:  Irregular rhythm, normal S1 and S2, no S3.  Abdomen:  Soft, non-tender. Extremities:  No edema present. No cyanosis. No clubbing. Gouty swelling of both hands. CNS: AxOx3, Cranial nerves grossly intact, moves all 4 extremities. Right handed. Skin: Warm and dry.   Intake/Output from previous day: 12/11 0701 - 12/12 0700 In: 1820 [P.O.:1240; I.V.:580] Out: 701 [Urine:700; Stool:1]    Lab Results: BMET    Component Value Date/Time   NA 137 10/12/2011 0412   K 3.7 10/12/2011 0412   CL 102 10/12/2011 0412   CO2 23 10/12/2011 0412   GLUCOSE 100* 10/12/2011 0412   BUN 21 10/12/2011 0412   CREATININE 1.44* 10/12/2011 0412   CREATININE 1.75* 12/28/2010 1114   CALCIUM 8.4 10/12/2011 0412   GFRNONAA 33* 10/12/2011 0412   GFRAA 39* 10/12/2011 0412   CBC    Component Value Date/Time   WBC 6.9 10/11/2011 0630   RBC 4.40 10/11/2011 0630   HGB 13.0 10/11/2011 0630   HCT 38.6 10/11/2011 0630   PLT 250 10/11/2011 0630   MCV 87.7 10/11/2011 0630   MCH 29.5 10/11/2011 0630   MCHC 33.7 10/11/2011 0630   RDW 14.8  10/11/2011 0630   LYMPHSABS 4.6* 10/10/2011 2234   MONOABS 0.9 10/10/2011 2234   EOSABS 0.2 10/10/2011 2234   BASOSABS 0.1 10/10/2011 2234   CARDIAC ENZYMES Lab Results  Component Value Date   CKTOTAL 73 10/11/2011   CKMB 2.5 10/11/2011   TROPONINI <0.30 10/11/2011    Assessment/Plan:  Patient Active Hospital Problem List: Atrial fibrillation with RVR (10/11/2011)   Assessment: Stable post IV metoprolol   Plan:Increase PO metoprolol and continue cardizem HYPERLIPIDEMIA (02/06/2007)   Assessment: Stable   Plan: medical treatment HYPERTENSION (02/06/2007)   Assessment: Stable.   Plan: Medical treatment    LOS: 2 days    Orpah Cobb  MD  10/12/2011, 8:46 AM

## 2011-10-12 NOTE — Progress Notes (Signed)
Subjective:  Patient feels much better. No event over night.  Objective: Vital signs in last 24 hours: Filed Vitals:   10/12/11 0400 10/12/11 0500 10/12/11 0600 10/12/11 0700  BP: 130/67 128/64 140/80 132/79  Pulse: 46 85 70 84  Temp:      TempSrc:      Resp: 12 27 20 16   Height:      Weight:      SpO2: 94% 93% 94% 92%   Weight change: 3.6 kg (7 lb 15 oz)  Intake/Output Summary (Last 24 hours) at 10/12/11 0803 Last data filed at 10/12/11 0600  Gross per 24 hour  Intake   1785 ml  Output    701 ml  Net   1084 ml   Physical Exam: General: alert, cooperative, and in no apparent distress HEENT: pupils equal round and reactive to light, vision grossly intact, oropharynx clear and non-erythematous  Neck: supple, no lymphadenopathy, no JVD Lungs: clear to ascultation bilaterally, normal work of respiration, no wheezes, rales, ronchi Heart: tachycardic, irregular rhythm, no murmurs, gallops, or rubs Abdomen: soft, non-tender, non-distended, normal bowel sounds Msk: Left knee tender to palpation, passive ROM limited by pain, no warmth, edema, or erythema. Right knee less ttp, greater ROM.  Extremities: no cyanosis, clubbing, or edema. Chronic gout-related deformities in her hands. Neurologic: alert & oriented X3, cranial nerves II-XII intact, strength grossly intact, sensation intact to light touch  Lab Results: Basic Metabolic Panel:  Lab 10/12/11 4098 10/11/11 0630  NA 137 138  K 3.7 3.6  CL 102 103  CO2 23 19  GLUCOSE 100* 87  BUN 21 20  CREATININE 1.44* 1.27*  CALCIUM 8.4 8.8  MG -- 1.3*  PHOS -- --   Liver Function Tests:  Lab 10/11/11 0630 10/10/11 2234  AST 35 40*  ALT 21 24  ALKPHOS 143* 165*  BILITOT 0.7 0.8  PROT 6.3 7.4  ALBUMIN 2.9* 3.3*   CBC:  Lab 10/11/11 0630 10/10/11 2248 10/10/11 2234  WBC 6.9 -- 7.8  NEUTROABS -- -- 2.1  HGB 13.0 15.6* --  HCT 38.6 46.0 --  MCV 87.7 -- 87.7  PLT 250 -- 254   Cardiac Enzymes:  Lab 10/11/11 2349  10/11/11 1545 10/11/11 0814  CKTOTAL 73 74 64  CKMB 2.5 2.8 2.6  CKMBINDEX -- -- --  TROPONINI <0.30 <0.30 <0.30   Hemoglobin A1C:  Lab 10/11/11 0814  HGBA1C 6.2*   Fasting Lipid Panel:  Lab 10/11/11 0630  CHOL 121  HDL 37*  LDLCALC 58  TRIG 119  CHOLHDL 3.3  LDLDIRECT --   Thyroid Function Tests:  Lab 10/11/11 0630  TSH 2.328  T4TOTAL --  FREET4 --  T3FREE --  THYROIDAB --   Coagulation:  Lab 10/12/11 0412 10/11/11 0630 10/10/11 2255  LABPROT 79.5* 65.5* 58.8*  INR 9.76* 7.62* 6.64*   Anemia Panel: No results found for this basename: VITAMINB12,FOLATE,FERRITIN,TIBC,IRON,RETICCTPCT in the last 168 hours Urine Drug Screen: Drugs of Abuse     Component Value Date/Time   LABOPIA NONE DETECTED 01/26/2010 1130   COCAINSCRNUR NONE DETECTED 01/26/2010 1130   LABBENZ POSITIVE* 01/26/2010 1130   AMPHETMU NONE DETECTED 01/26/2010 1130   THCU NONE DETECTED 01/26/2010 1130   LABBARB  Value: NONE DETECTED        DRUG SCREEN FOR MEDICAL PURPOSES ONLY.  IF CONFIRMATION IS NEEDED FOR ANY PURPOSE, NOTIFY LAB WITHIN 5 DAYS.        LOWEST DETECTABLE LIMITS FOR URINE DRUG SCREEN Drug Class  Cutoff (ng/mL) Amphetamine      1000 Barbiturate      200 Benzodiazepine   200 Tricyclics       300 Opiates          300 Cocaine          300 THC              50 01/26/2010 1130     Micro Results: Recent Results (from the past 240 hour(s))  MRSA PCR SCREENING     Status: Normal   Collection Time   10/11/11  4:07 AM      Component Value Range Status Comment   MRSA by PCR NEGATIVE  NEGATIVE  Final    Studies/Results: Dg Chest Portable 1 View  10/10/2011  *RADIOLOGY REPORT*  Clinical Data: Chest pain  PORTABLE CHEST - 1 VIEW  Comparison: 03/16/2010  Findings: Stable asymmetric elevation right hemidiaphragm. The cardiopericardial silhouette is enlarged. The lungs are clear without focal infiltrate, edema, pneumothorax or pleural effusion. Telemetry leads overlie the chest.  IMPRESSION: Stable.   No acute findings.  Original Report Authenticated By: ERIC A. MANSELL, M.D.   Medications:  Scheduled Meds:   . antiseptic oral rinse  15 mL Mouth Rinse q12n4p  . aspirin  324 mg Oral Once  . colchicine  0.6 mg Oral BID  . digoxin  0.125 mg Oral Daily  . diltiazem  240 mg Oral Daily  . docusate sodium  100 mg Oral BID  . furosemide  20 mg Intravenous Daily  . latanoprost  1 drop Both Eyes Daily  . metoprolol  5 mg Intravenous Once  . metoprolol  50 mg Oral BID  . nitroGLYCERIN  1 inch Topical Q6H  . phytonadione  5 mg Oral Once  . promethazine  12.5 mg Intravenous To Major  . sodium chloride  3 mL Intravenous Q12H  . DISCONTD: furosemide  40 mg Intravenous Daily   Continuous Infusions:   . sodium chloride 10 mL (10/11/11 1858)  . diltiazem (CARDIZEM) infusion 10 mg/hr (10/12/11 0558)   PRN Meds:.sodium chloride, acetaminophen, alum & mag hydroxide-simeth, HYDROcodone-acetaminophen, morphine, promethazine, promethazine, sodium chloride Assessment/Plan:  1. Atrial fibrillation with RVR: Patient has a known history of atrial fibrillation and is on chronic Coumadin, metoprolol and Cardizem at home.  She presents to the ER with symptoms of chest discomfort associated with nausea and palpitations, and was found to be in atrial fibrillation with RVR( HR in 160's). She was started on Cardizem drip. Her CHADS2 score is 4 ( ? cerebellar infarct on CT- head seen in MRI in 10/09).  Unsuccessful attempted electrical cardioversion in 04/11. Her last echo from 05/11 was reviewed with EF- 65-70% and no regional wall motion abnormalities. Her INR at presentation was 6.64. This AM is 9.76. Patient does not have bleeding tendency. Patient is getting Cardizem drip at 10 mg/hour now. HR is 119/min. Troponin is negative X 3. TSH normal  Plan:  -Cardiology was consulted.  Dr. Algie Coffer saw patient. Increased her po Cardizem to 240 mg daily. -will consider stop IV cardizem trip and increase po metoprolol  dose to 100 mg bid.  -2. Hypertension: BP 132/79 this AM  -Continue home meds and metoprolol and cardizem  3. Diastolic CHF: Currently stable. No signs of fluid overload on exam. -Strict I and O's , daily weights. -Continue medical management - BB- blocker, ASA. -decrease lasix at to 20 daily  4. Chronic renal insufficiency: Baseline Cr-1.4- 1.5.  Her current Cr is close to  the baseline. -Continue to monitor.  5. Hypokalemia: likely 2/2 diuretics. -replete -check serum Mg.   6. Risk stratification; FLP, A1C  7. Rheumatoid arthritis and gout: She has evidence of classic ulnar deviation on exam. No signs of gout flare on exam. -Symptomatic treatment with pain meds. -continue gout -patient notes that she takes benecar, but this medication is not on her home list   8. upratherapeutic INR: INR 9.76 this AM. No bleeding tendency. -will hold coumadin and no anticoagulation at this time with supratherapeutic INR. -vitamin K 5 mg po once  -Recheck INR at AM lab  9 .DVT: SCD's, supratherapeutic INR     LOS: 2 days   Lorretta Harp 10/12/2011, 8:03 AM

## 2011-10-12 NOTE — Plan of Care (Signed)
Problem: Phase [name] Progression Outcomes Goal: Sinus rhythm established or heart rate < 100 at rest

## 2011-10-12 NOTE — Progress Notes (Signed)
Internal Medicine Teaching Service Attending Note Date: 10/12/2011  Patient name: Kim [name]  Medical record number: [id number]  Date of birth: November 28, 1928    This patient has been seen and discussed with the house staff. Please see their note for complete details. I concur with their findings with the following additions/corrections:  Patient is still in atrial fibrillation with RVR. Will get a 12 lead this AM to confirm that. Will see if cardiology wants to do anything different as this regimen does not seem to be working for more than short periods of time. Will put her on prednisone taper for gout flare. I think with her baseline CRI she should not be on more than .6mg  of colchicine. It is still unclear what caused her elevated INR that is still climbing, have given Vit K 5mg  po, as she has no sign of active bleeding.  Lizbett Garciagarcia E 10/12/2011, 11:17 AM

## 2011-10-12 NOTE — Progress Notes (Signed)
Pt monitor alarming for HR in the 40s.  Pt checked denies dizziness, SOB, CP.  BP 125/59.  Cardizem gtt weaned off.  Dr Clyde Lundborg notified, order to hold metoprolol received.  Will continue to monitor HR.

## 2011-10-12 NOTE — Progress Notes (Signed)
CRITICAL VALUE ALERT  Critical value received: PT 79.5    INR 9.76  Date of notification:  10/12/11   Time of notification:  0615  Critical value read back yes  Nurse who received alert: Arman Bogus RN  MD notified (1st page):  Dr Luther Redo  Time of first page:  0625  MD notified (2nd page):  Time of second page:  Responding MD: Dr Luther Redo  Time MD responded: 0630

## 2011-10-12 NOTE — Progress Notes (Signed)
Pt HR in 130s on assessment, cardizem increased to 15mg /h and pt given pain medication for pain 8/10 in Left leg.  HR is now in 90s after 1 hour.

## 2011-10-12 NOTE — Progress Notes (Signed)
  Echocardiogram 2D Echocardiogram has been performed.  Kim [name] 10/12/2011, 3:14 PM

## 2011-10-13 ENCOUNTER — Other Ambulatory Visit: Payer: Self-pay

## 2011-10-13 LAB — PROTIME-INR
INR: 2 — ABNORMAL HIGH (ref 0.00–1.49)
Prothrombin Time: 23 seconds — ABNORMAL HIGH (ref 11.6–15.2)

## 2011-10-13 LAB — CARDIAC PANEL(CRET KIN+CKTOT+MB+TROPI)
Relative Index: 1.9 (ref 0.0–2.5)
Relative Index: 1.7 (ref 0.0–2.5)
Total CK: 114 U/L (ref 7–177)
Total CK: 141 U/L (ref 7–177)
Troponin I: <0.3 ng/mL (ref <0.30)

## 2011-10-13 LAB — BASIC METABOLIC PANEL
CO2: 21 mEq/L (ref 19–32)
Calcium: 8.7 mg/dL (ref 8.4–10.5)
Chloride: 99 mEq/L (ref 96–112)
GFR calc Af Amer: 41 mL/min — ABNORMAL LOW (ref >90)
Sodium: 134 mEq/L — ABNORMAL LOW (ref 135–145)

## 2011-10-13 MED ORDER — WARFARIN SODIUM 1 MG PO TABS
1.0000 mg | ORAL_TABLET | Freq: Once | ORAL | Status: AC
  Administered 2011-10-13: 1 mg via ORAL
  Filled 2011-10-13: qty 1

## 2011-10-13 MED ORDER — AMIODARONE HCL 200 MG PO TABS
400.0000 mg | ORAL_TABLET | Freq: Two times a day (BID) | ORAL | Status: DC
  Administered 2011-10-13 – 2011-10-14 (×3): 400 mg via ORAL
  Filled 2011-10-13 (×4): qty 2

## 2011-10-13 MED ORDER — METOPROLOL TARTRATE 50 MG PO TABS
50.0000 mg | ORAL_TABLET | Freq: Four times a day (QID) | ORAL | Status: DC
  Administered 2011-10-13 – 2011-10-14 (×5): 50 mg via ORAL
  Filled 2011-10-13 (×9): qty 1

## 2011-10-13 NOTE — Progress Notes (Signed)
Subjective: Patient feels much better. No event over night.  Objective: Vital signs in last 24 hours: Filed Vitals:   10/13/11 0500 10/13/11 0748 10/13/11 0956 10/13/11 1139  BP: 139/92 148/79 175/106 160/87  Pulse: 123 128 128 134  Temp: 98.8 F (37.1 C) 98 F (36.7 C)    TempSrc:  Oral    Resp: 22  15   Height:      Weight: 61.7 kg (136 lb 0.4 oz)     SpO2: 96% 95% 98%    Weight change: -3.5 kg (-7 lb 11.5 oz)  Intake/Output Summary (Last 24 hours) at 10/13/11 1153 Last data filed at 10/13/11 1000  Gross per 24 hour  Intake    652 ml  Output   1750 ml  Net  -1098 ml   Physical Exam:  General: alert, cooperative, and in no apparent distress HEENT: pupils equal round and reactive to light, vision grossly intact, oropharynx clear and non-erythematous  Neck: supple, no lymphadenopathy, no JVD Lungs: clear to ascultation bilaterally, normal work of respiration, no wheezes, rales, ronchi Heart: tachycardic, irregular rhythm, no murmurs, gallops, or rubs Abdomen: soft, non-tender, non-distended, normal bowel sounds Msk: Left knee tender to palpation, passive ROM limited by pain, no warmth, edema, or erythema. Right knee less ttp, greater ROM.   Extremities: no cyanosis, clubbing, or edema. Chronic gout-related deformities in her hands. Neurologic: alert & oriented X3, cranial nerves II-XII intact, strength grossly intact, sensation intact to light touch   Lab Results: Basic Metabolic Panel:  Lab 10/13/11 8295 10/12/11 0412 10/11/11 0630  NA 134* 137 --  K 4.3 3.7 --  CL 99 102 --  CO2 21 23 --  GLUCOSE 162* 100* --  BUN 21 21 --  CREATININE 1.37* 1.44* --  CALCIUM 8.7 8.4 --  MG -- -- 1.3*  PHOS -- -- --   Liver Function Tests:  Lab 10/11/11 0630 10/10/11 2234  AST 35 40*  ALT 21 24  ALKPHOS 143* 165*  BILITOT 0.7 0.8  PROT 6.3 7.4  ALBUMIN 2.9* 3.3*     CBC:  Lab 10/11/11 0630 10/10/11 2248 10/10/11 2234  WBC 6.9 -- 7.8  NEUTROABS -- -- 2.1  HGB  13.0 15.6* --  HCT 38.6 46.0 --  MCV 87.7 -- 87.7  PLT 250 -- 254   Cardiac Enzymes:  Lab 10/11/11 2349 10/11/11 1545 10/11/11 0814  CKTOTAL 73 74 64  CKMB 2.5 2.8 2.6  CKMBINDEX -- -- --  TROPONINI <0.30 <0.30 <0.30   BNP: No components found with this basename: POCBNP:3 D-Dimer: No results found for this basename: DDIMER:2 in the last 168 hours CBG: No results found for this basename: GLUCAP:6 in the last 168 hours Hemoglobin A1C:  Lab 10/11/11 0814  HGBA1C 6.2*   Fasting Lipid Panel:  Lab 10/11/11 0630  CHOL 121  HDL 37*  LDLCALC 58  TRIG 621  CHOLHDL 3.3  LDLDIRECT --   Thyroid Function Tests:  Lab 10/11/11 0630  TSH 2.328  T4TOTAL --  FREET4 --  T3FREE --  THYROIDAB --   Coagulation:  Lab 10/13/11 0658 10/13/11 0540 10/12/11 0412 10/11/11 0630  LABPROT 22.9* 23.0* 79.5* 65.5*  INR 1.99* 2.00* 9.76* 7.62*   Anemia Panel: No results found for this basename: VITAMINB12,FOLATE,FERRITIN,TIBC,IRON,RETICCTPCT in the last 168 hours Urine Drug Screen: Drugs of Abuse     Component Value Date/Time   LABOPIA NONE DETECTED 01/26/2010 1130   COCAINSCRNUR NONE DETECTED 01/26/2010 1130   LABBENZ POSITIVE* 01/26/2010 1130  AMPHETMU NONE DETECTED 01/26/2010 1130   THCU NONE DETECTED 01/26/2010 1130   LABBARB  Value: NONE DETECTED        DRUG SCREEN FOR MEDICAL PURPOSES ONLY.  IF CONFIRMATION IS NEEDED FOR ANY PURPOSE, NOTIFY LAB WITHIN 5 DAYS.        LOWEST DETECTABLE LIMITS FOR URINE DRUG SCREEN Drug Class       Cutoff (ng/mL) Amphetamine      1000 Barbiturate      200 Benzodiazepine   200 Tricyclics       300 Opiates          300 Cocaine          300 THC              50 01/26/2010 1130    :  Micro Results: Recent Results (from the past 240 hour(s))  MRSA PCR SCREENING     Status: Normal   Collection Time   10/11/11  4:07 AM      Component Value Range Status Comment   MRSA by PCR NEGATIVE  NEGATIVE  Final    Studies/Results: No results found. Medications:    Scheduled Meds:   . amiodarone  400 mg Oral BID  . antiseptic oral rinse  15 mL Mouth Rinse q12n4p  . aspirin  324 mg Oral Once  . colchicine  0.6 mg Oral BID  . diltiazem  240 mg Oral Daily  . docusate sodium  100 mg Oral BID  . furosemide  20 mg Intravenous Daily  . latanoprost  1 drop Both Eyes Daily  . metoprolol tartrate  50 mg Oral Q6H  . predniSONE  40 mg Oral Q breakfast  . sodium chloride  3 mL Intravenous Q12H  . DISCONTD: digoxin  0.125 mg Oral Daily  . DISCONTD: metoprolol  100 mg Oral BID  . DISCONTD: nitroGLYCERIN  1 inch Topical Q6H   Continuous Infusions:   . sodium chloride 10 mL (10/11/11 1858)  . DISCONTD: diltiazem (CARDIZEM) infusion Stopped (10/12/11 1757)   PRN Meds:.sodium chloride, acetaminophen, alum & mag hydroxide-simeth, HYDROcodone-acetaminophen, morphine, promethazine, promethazine, sodium chloride Assessment/Plan:  -1. Atrial fibrillation with RVR: Patient has a known history of atrial fibrillation and is on chronic Coumadin, metoprolol and Cardizem at home.  She presents to the ER with symptoms of chest discomfort associated with nausea and palpitations, and was found to be in atrial fibrillation with RVR( HR in 160's). She was started on Cardizem drip. Her CHADS2 score is 4 ( ? cerebellar infarct on CT- head seen in MRI in 10/09).  Unsuccessful attempted electrical cardioversion in 04/11. Her last echo from 05/11 was reviewed with EF- 65-70% and no regional wall motion abnormalities. Troponin is negative X 3. TSH normal. Her INR at presentation was 6.64. Gave 5mg  Vitamin K yestodaty. This AM her INR is 2.00. Repeated INR is 1.99.  Patient is not getting Cardizem drip any more. Her HR was between 56 to 90 overnight. This morning inreased to 134/min. Disccused with Dr. Algie Coffer.   -will treat with 3 medications: 240 mg Cardizem po daily; Metoprolol 50 mg q6h and amiodarone 400 mg po bid. -will adjust the doses depending patient's response.  -2.  Hypertension: BP 160/87 this AM  -adjusted Metoprolol dose today. Will monitor  3. Diastolic CHF: Currently stable. No signs of fluid overload on exam. -Strict I and O's , daily weights. -Continue medical management - BB- blocker, ASA. -decrease lasix at to 20 daily  4. Chronic renal insufficiency: Baseline  Cr-1.4- 1.5.  Her current Cr 1.37  is close to the baseline. -Continue to monitor.  5. Hypokalemia: likely 2/2 diuretics. resolved    6. Risk stratification; LDL 58 , A1C 6.2, THS 2.328  7. Rheumatoid arthritis and gout: She has evidence of classic ulnar deviation on exam.  -will continue with prednisone 40 mg daily (started 12/12)   8. upratherapeutic INR: resolved. INR decreased from 9.76 to 1.99 today.  -will restart coumadin   9 .DVT: SCD's, supratherapeutic INR     LOS: 3 days   Kim [name] 10/13/2011, 11:53 AM

## 2011-10-13 NOTE — Progress Notes (Signed)
Internal Medicine Teaching Service Attending Note Date: 10/13/2011  Patient name: Kim [name]  Medical record number: [id number]  Date of birth: 08/18/1929    This patient has been seen and discussed with the house staff. Please see their note for complete details. I concur with their findings with the following additions/corrections:  Patient feeling better. Heart rate still up and down. Appreciate cardiology input. Now adding amiodarone.  WOODYEAR,WYNNE E 10/13/2011, 1:24 PM

## 2011-10-13 NOTE — Progress Notes (Signed)
EKG done and seen by Dr. Clyde Lundborg, cardiac enzymes x3 being done.  Pt did not want Morphine at this time, states she has "a little chest pressure".  Dr. Algie Coffer notified and wants to be notified if enzymes positive.

## 2011-10-13 NOTE — Progress Notes (Signed)
This an addendum to my today's progress note:  Patient's HR is lower to around 80/min in afternoon. She reports one episode of chest pressure feeling. A EKG was done, which showed T inversion from V2 to V6. When I saw patient at 4:00PM. She does not have chest pain or pressure feeling.  I ordered CE X3. The first troponin is negative. Nurse called Dr. Algie Coffer about this new development. Dr. Algie Coffer suggest following CE at this movement.   Kim [name]

## 2011-10-13 NOTE — Consult Note (Signed)
Subjective:  Rapid heart rate off and on mostly with activity. H/O Atrial fibrillation. INR of 2.00. No chest pain. + weakness. Against cardioversion  Objective:  Vital Signs in the last 24 hours: Temp:  [97.8 F (36.6 C)-98.8 F (37.1 C)] 98 F (36.7 C) (12/13 0748) Pulse Rate:  [56-133] 128  (12/13 0748) Cardiac Rhythm:  [-] Atrial fibrillation (12/13 0800) Resp:  [17-24] 22  (12/13 0500) BP: (120-152)/(56-92) 148/79 mmHg (12/13 0748) SpO2:  [94 %-100 %] 95 % (12/13 0748) Weight:  [61.7 kg (136 lb 0.4 oz)] 136 lb 0.4 oz (61.7 kg) (12/13 0500)  Physical Exam: BP Readings from Last 1 Encounters:  10/13/11 148/79    Wt Readings from Last 1 Encounters:  10/13/11 61.7 kg (136 lb 0.4 oz)    Weight change: -3.5 kg (-7 lb 11.5 oz)  HEENT: [place]/AT, Eyes-Brown, PERL, EOMI, Conjunctiva-Pink, Sclera-Non-icteric Neck: No JVD, No bruit, Trachea midline. Lungs:  Clear, Bilateral. Cardiac:  Irregular rhythm, normal S1 and S2, no S3.  Abdomen:  Soft, non-tender. Extremities:  No edema present. No cyanosis. No clubbing. Gouty swelling of both hands. CNS: AxOx3, Cranial nerves grossly intact, moves all 4 extremities. Right handed. Skin: Warm and dry.   Intake/Output from previous day: 12/12 0701 - 12/13 0700 In: 752 [P.O.:410; I.V.:340; IV Piggyback:2] Out: 1350 [Urine:1350]    Lab Results: BMET    Component Value Date/Time   NA 134* 10/13/2011 0540   K 4.3 10/13/2011 0540   CL 99 10/13/2011 0540   CO2 21 10/13/2011 0540   GLUCOSE 162* 10/13/2011 0540   BUN 21 10/13/2011 0540   CREATININE 1.37* 10/13/2011 0540   CREATININE 1.75* 12/28/2010 1114   CALCIUM 8.7 10/13/2011 0540   GFRNONAA 35* 10/13/2011 0540   GFRAA 41* 10/13/2011 0540   CBC    Component Value Date/Time   WBC 6.9 10/11/2011 0630   RBC 4.40 10/11/2011 0630   HGB 13.0 10/11/2011 0630   HCT 38.6 10/11/2011 0630   PLT 250 10/11/2011 0630   MCV 87.7 10/11/2011 0630   MCH 29.5 10/11/2011 0630   MCHC 33.7 10/11/2011  0630   RDW 14.8 10/11/2011 0630   LYMPHSABS 4.6* 10/10/2011 2234   MONOABS 0.9 10/10/2011 2234   EOSABS 0.2 10/10/2011 2234   BASOSABS 0.1 10/10/2011 2234   CARDIAC ENZYMES Lab Results  Component Value Date   CKTOTAL 73 10/11/2011   CKMB 2.5 10/11/2011   TROPONINI <0.30 10/11/2011    Assessment/Plan:  Patient Active Hospital Problem List: Atrial fibrillation with RVR (10/11/2011)   Assessment: Improing   Plan:Add amiodarone and stop digoxin HYPERLIPIDEMIA (02/06/2007)   Assessment: Stable   Plan: medical treatment HYPERTENSION (02/06/2007)   Assessment: Stable.   Plan: Medical treatment    LOS: 3 days    Orpah Cobb  MD  10/13/2011, 9:17 AM

## 2011-10-13 NOTE — Progress Notes (Signed)
ANTICOAGULATION CONSULT NOTE - Initial Consult  Pharmacy Consult for Coumadin  Indication: atrial fibrillation  No Known Allergies  Patient Measurements: Height: 5\' 4"  (162.6 cm) Weight: 136 lb 0.4 oz (61.7 kg) IBW/kg (Calculated) : 54.7   Vital Signs: Temp: 98 F (36.7 C) (12/13 1231) Temp src: Oral (12/13 1231) BP: 152/79 mmHg (12/13 1322) Pulse Rate: 62  (12/13 1400)  Labs:  Basename 10/13/11 1402 10/13/11 0658 10/13/11 0540 10/12/11 0412 10/11/11 2349 10/11/11 1545 10/11/11 0630 10/10/11 2255 10/10/11 2248 10/10/11 2234  HGB -- -- -- -- -- -- 13.0 -- 15.6* --  HCT -- -- -- -- -- -- 38.6 -- 46.0 42.9  PLT -- -- -- -- -- -- 250 -- -- 254  APTT -- -- -- -- -- -- -- 70* -- --  LABPROT -- 22.9* 23.0* 79.5* -- -- -- -- -- --  INR -- 1.99* 2.00* 9.76* -- -- -- -- -- --  HEPARINUNFRC -- -- -- -- -- -- -- -- -- --  CREATININE -- -- 1.37* 1.44* -- -- 1.27* -- -- --  CKTOTAL 114 -- -- -- 73 74 -- -- -- --  CKMB 2.2 -- -- -- 2.5 2.8 -- -- -- --  TROPONINI <0.30 -- -- -- <0.30 <0.30 -- -- -- --   Medical History: Past Medical History  Diagnosis Date  . HTN (hypertension)   . AF (atrial fibrillation)     On chronic coumadin  . Diastolic heart failure     Echo 5/11: There was moderate concentric hypertrophy. Systolic function was vigorous. The best esimated ejection fraction was in the range of 65% to 70%. Wall motion was normal  . Gout   . RA (rheumatoid arthritis)   . Chronic renal insufficiency     Baseline approx: 1.7  . Glaucoma     No recent eye visits noted in EMR.   . Pulmonary artery hypertension     Per 5/11 echo, PA peak pressure: 69mm Hg  . Stroke   . Coronary artery disease     Medications:  Scheduled:    . amiodarone  400 mg Oral BID  . antiseptic oral rinse  15 mL Mouth Rinse q12n4p  . aspirin  324 mg Oral Once  . colchicine  0.6 mg Oral BID  . diltiazem  240 mg Oral Daily  . docusate sodium  100 mg Oral BID  . furosemide  20 mg Intravenous Daily  .  latanoprost  1 drop Both Eyes Daily  . metoprolol tartrate  50 mg Oral Q6H  . predniSONE  40 mg Oral Q breakfast  . sodium chloride  3 mL Intravenous Q12H  . warfarin  1 mg Oral ONCE-1800  . DISCONTD: digoxin  0.125 mg Oral Daily  . DISCONTD: metoprolol  100 mg Oral BID  . DISCONTD: nitroGLYCERIN  1 inch Topical Q6H    Assessment: 82 YOF on chronic coumadin for afib. INR supratherapeutic on admission, s/p vitamin K yesterday. INR now down < 2, to restart coumadin per pharmacy. Pt. Home dose was 2.5mg  daily. Also started Amiodarone 400mg  bid today. Will start with a lower dose  Goal of Therapy:  INR 2-3   Plan:  Coumadin 1mg  PO x 1 today F/u INR in am  Riki Rusk 10/13/2011,3:14 PM

## 2011-10-13 NOTE — Progress Notes (Signed)
Pt HR 120s at start of shift.  Is now in the 130s with elevated BP.  Cardizem and amiodarone given per schedule MD order.  Dr Clyde Lundborg notified and order for lopressor placed.  Pharmacy called to send ASAP.

## 2011-10-13 NOTE — Progress Notes (Signed)
Pt called RN to room.  C/o chest pressure pointing to center of chest and SOB, oxygen saturation 99% on RA, BP 152/79, appears to now be in SR on the monitor.  Lung sounds remain clear.  Pt states "it may be indigestion, I get GERD".  Pt asked that lunch tray be brought near her and felt okay to eat.  Dr. Youlanda Roys notified.  Order for STAT EKG obtained.  No further orders at this time.  EKG being done now.

## 2011-10-14 ENCOUNTER — Telehealth: Payer: Self-pay | Admitting: *Deleted

## 2011-10-14 LAB — COMPREHENSIVE METABOLIC PANEL
Albumin: 2.9 g/dL — ABNORMAL LOW (ref 3.5–5.2)
Alkaline Phosphatase: 170 U/L — ABNORMAL HIGH (ref 39–117)
BUN: 24 mg/dL — ABNORMAL HIGH (ref 6–23)
Calcium: 8.7 mg/dL (ref 8.4–10.5)
Creatinine, Ser: 1.24 mg/dL — ABNORMAL HIGH (ref 0.50–1.10)
GFR calc Af Amer: 46 mL/min — ABNORMAL LOW (ref >90)
Glucose, Bld: 96 mg/dL (ref 70–99)
Potassium: 3.5 mEq/L (ref 3.5–5.1)
Total Protein: 6.6 g/dL (ref 6.0–8.3)

## 2011-10-14 LAB — CARDIAC PANEL(CRET KIN+CKTOT+MB+TROPI)
CK, MB: 2.6 ng/mL (ref 0.3–4.0)
Relative Index: 2.5 (ref 0.0–2.5)
Total CK: 106 U/L (ref 7–177)
Troponin I: <0.3 ng/mL (ref <0.30)

## 2011-10-14 LAB — PROTIME-INR: Prothrombin Time: 16 seconds — ABNORMAL HIGH (ref 11.6–15.2)

## 2011-10-14 MED ORDER — HYDROCODONE-ACETAMINOPHEN 5-325 MG PO TABS: 1.0000 | ORAL_TABLET | Freq: Four times a day (QID) | ORAL | Status: AC | PRN

## 2011-10-14 MED ORDER — DILTIAZEM HCL ER COATED BEADS 240 MG PO CP24: 240.0000 mg | ORAL_CAPSULE | Freq: Every day | ORAL | Status: DC

## 2011-10-14 MED ORDER — ACETAMINOPHEN 325 MG PO TABS: 650.0000 mg | ORAL_TABLET | Freq: Four times a day (QID) | ORAL | Status: AC | PRN

## 2011-10-14 MED ORDER — COLCHICINE 0.6 MG PO TABS: 0.3000 mg | ORAL_TABLET | Freq: Every day | ORAL | Status: DC

## 2011-10-14 MED ORDER — WARFARIN SODIUM 5 MG PO TABS: 2.5000 mg | ORAL_TABLET | Freq: Every day | ORAL | Status: DC

## 2011-10-14 MED ORDER — WARFARIN SODIUM 2.5 MG PO TABS
2.5000 mg | ORAL_TABLET | Freq: Once | ORAL | Status: DC
  Filled 2011-10-14: qty 1

## 2011-10-14 MED ORDER — ASPIRIN 81 MG PO CHEW: 324.0000 mg | CHEWABLE_TABLET | Freq: Once | ORAL | Status: DC

## 2011-10-14 MED ORDER — METOPROLOL TARTRATE 50 MG PO TABS: 50.0000 mg | ORAL_TABLET | Freq: Four times a day (QID) | ORAL | Status: DC

## 2011-10-14 MED ORDER — PROMETHAZINE HCL 12.5 MG PO TABS: 12.5000 mg | ORAL_TABLET | Freq: Four times a day (QID) | ORAL | Status: AC | PRN

## 2011-10-14 MED ORDER — ALUM & MAG HYDROXIDE-SIMETH 200-200-20 MG/5ML PO SUSP: 30.0000 mL | Freq: Four times a day (QID) | ORAL | Status: AC | PRN

## 2011-10-14 MED ORDER — PREDNISONE 20 MG PO TABS: 40.0000 mg | ORAL_TABLET | Freq: Every day | ORAL | Status: AC

## 2011-10-14 MED ORDER — AMIODARONE HCL 400 MG PO TABS: 200.0000 mg | ORAL_TABLET | Freq: Two times a day (BID) | ORAL | Status: DC

## 2011-10-14 NOTE — Progress Notes (Signed)
10-14-11 UR completed. Ronny Flurry RN BS

## 2011-10-14 NOTE — Telephone Encounter (Signed)
[facility] calls and ask for clarification of lopressor dose, paged dr Clyde Lundborg and he confirms 50mg  every 6 hours, called pharm, they will fill.

## 2011-10-14 NOTE — Consult Note (Signed)
Subjective:  Improved heart rate control. No chest pain. Some nausea from medications on empty stomach.  Objective:  Vital Signs in the last 24 hours: Temp:  [97.6 F (36.4 C)-98 F (36.7 C)] 97.9 F (36.6 C) (12/14 1155) Pulse Rate:  [62-104] 104  (12/14 1155) Cardiac Rhythm:  [-] Atrial fibrillation (12/14 0800) Resp:  [16-25] 16  (12/14 1155) BP: (148-174)/(69-99) 153/99 mmHg (12/14 1155) SpO2:  [95 %-100 %] 96 % (12/14 1155) Weight:  [61.6 kg (135 lb 12.9 oz)] 135 lb 12.9 oz (61.6 kg) (12/14 0011)  Physical Exam: BP Readings from Last 1 Encounters:  10/14/11 153/99    Wt Readings from Last 1 Encounters:  10/14/11 61.6 kg (135 lb 12.9 oz)    Weight change: -0.1 kg (-3.5 oz)  HEENT: [place]/AT, Eyes-Brown, PERL, EOMI, Conjunctiva-Pink, Sclera-Non-icteric Neck: No JVD, No bruit, Trachea midline. Lungs:  Clear, Bilateral. Cardiac:  Irregular rhythm, normal S1 and S2, no S3.  Abdomen:  Soft, non-tender. Extremities:  No edema present. No cyanosis. No clubbing. Gouty swelling of both hands. Right upper large hematoma some what softer and less tender. CNS: AxOx3, Cranial nerves grossly intact, moves all 4 extremities. Right handed. Skin: Warm and dry.   Intake/Output from previous day: 12/13 0701 - 12/14 0700 In: 722 [P.O.:720; IV Piggyback:2] Out: 1151 [Urine:1150; Stool:1]    Lab Results: BMET    Component Value Date/Time   NA 137 10/14/2011 0530   K 3.5 10/14/2011 0530   CL 103 10/14/2011 0530   CO2 23 10/14/2011 0530   GLUCOSE 96 10/14/2011 0530   BUN 24* 10/14/2011 0530   CREATININE 1.24* 10/14/2011 0530   CREATININE 1.75* 12/28/2010 1114   CALCIUM 8.7 10/14/2011 0530   GFRNONAA 40* 10/14/2011 0530   GFRAA 46* 10/14/2011 0530   CBC    Component Value Date/Time   WBC 6.9 10/11/2011 0630   RBC 4.40 10/11/2011 0630   HGB 13.0 10/11/2011 0630   HCT 38.6 10/11/2011 0630   PLT 250 10/11/2011 0630   MCV 87.7 10/11/2011 0630   MCH 29.5 10/11/2011 0630   MCHC 33.7  10/11/2011 0630   RDW 14.8 10/11/2011 0630   LYMPHSABS 4.6* 10/10/2011 2234   MONOABS 0.9 10/10/2011 2234   EOSABS 0.2 10/10/2011 2234   BASOSABS 0.1 10/10/2011 2234   CARDIAC ENZYMES Lab Results  Component Value Date   CKTOTAL 106 10/14/2011   CKMB 2.6 10/14/2011   TROPONINI <0.30 10/14/2011    Assessment/Plan:  Patient Active Hospital Problem List: Atrial fibrillation with RVR (10/11/2011)   Assessment: Improing   Plan:Continue amiodarone HYPERLIPIDEMIA (02/06/2007)   Assessment: Stable   Plan: medical treatment HYPERTENSION (02/06/2007)   Assessment: Stable.   Plan: Medical treatment F/U 4 days in office   LOS: 4 days    Orpah Cobb  MD  10/14/2011, 12:32 PM

## 2011-10-14 NOTE — Progress Notes (Signed)
Subjective:  patient feel much better. No event overnight. Heart rate is stablized around 80/min.   Objective: Vital signs in last 24 hours: Filed Vitals:   10/14/11 0438 10/14/11 0747 10/14/11 0933 10/14/11 1155  BP: 157/79 162/93 152/79 153/99  Pulse: 80 83  104  Temp: 97.9 F (36.6 C) 97.6 F (36.4 C)  97.9 F (36.6 C)  TempSrc: Oral Oral  Oral  Resp:  20  16  Height:      Weight:      SpO2: 97% 98%  96%   Weight change: -3.5 oz (-0.1 kg)  Intake/Output Summary (Last 24 hours) at 10/14/11 1236 Last data filed at 10/14/11 1100  Gross per 24 hour  Intake    603 ml  Output    977 ml  Net   -374 ml   Physical Exam:  Lab Results: Basic Metabolic Panel:  Lab 10/14/11 1308 10/13/11 0540 10/11/11 0630  NA 137 134* --  K 3.5 4.3 --  CL 103 99 --  CO2 23 21 --  GLUCOSE 96 162* --  BUN 24* 21 --  CREATININE 1.24* 1.37* --  CALCIUM 8.7 8.7 --  MG -- -- 1.3*  PHOS -- -- --   Liver Function Tests:  Lab 10/14/11 0530 10/11/11 0630  AST 58* 35  ALT 42* 21  ALKPHOS 170* 143*  BILITOT 1.0 0.7  PROT 6.6 6.3  ALBUMIN 2.9* 2.9*    CBC:  Lab 10/11/11 0630 10/10/11 2248 10/10/11 2234  WBC 6.9 -- 7.8  NEUTROABS -- -- 2.1  HGB 13.0 15.6* --  HCT 38.6 46.0 --  MCV 87.7 -- 87.7  PLT 250 -- 254   Cardiac Enzymes:  Lab 10/14/11 0530 10/13/11 2125 10/13/11 1402  CKTOTAL 106 141 114  CKMB 2.6 2.4 2.2  CKMBINDEX -- -- --  TROPONINI <0.30 <0.30 <0.30     Lab 10/11/11 0814  HGBA1C 6.2*   Fasting Lipid Panel:  Lab 10/11/11 0630  CHOL 121  HDL 37*  LDLCALC 58  TRIG 657  CHOLHDL 3.3  LDLDIRECT --   Thyroid Function Tests:  Lab 10/11/11 0630  TSH 2.328  T4TOTAL --  FREET4 --  T3FREE --  THYROIDAB --   Coagulation:  Lab 10/14/11 0530 10/13/11 0658 10/13/11 0540 10/12/11 0412  LABPROT 16.0* 22.9* 23.0* 79.5*  INR 1.25 1.99* 2.00* 9.76*   Anemia Panel: No results found for this basename: VITAMINB12,FOLATE,FERRITIN,TIBC,IRON,RETICCTPCT in the last  168 hours Urine Drug Screen: Drugs of Abuse     Component Value Date/Time   LABOPIA NONE DETECTED 01/26/2010 1130   COCAINSCRNUR NONE DETECTED 01/26/2010 1130   LABBENZ POSITIVE* 01/26/2010 1130   AMPHETMU NONE DETECTED 01/26/2010 1130   THCU NONE DETECTED 01/26/2010 1130   LABBARB  Value: NONE DETECTED        DRUG SCREEN FOR MEDICAL PURPOSES ONLY.  IF CONFIRMATION IS NEEDED FOR ANY PURPOSE, NOTIFY LAB WITHIN 5 DAYS.        LOWEST DETECTABLE LIMITS FOR URINE DRUG SCREEN Drug Class       Cutoff (ng/mL) Amphetamine      1000 Barbiturate      200 Benzodiazepine   200 Tricyclics       300 Opiates          300 Cocaine          300 THC              50 01/26/2010 1130      Micro Results: Recent Results (from  the past 240 hour(s))  MRSA PCR SCREENING     Status: Normal   Collection Time   10/11/11  4:07 AM      Component Value Range Status Comment   MRSA by PCR NEGATIVE  NEGATIVE  Final    Studies/Results: No results found. Medications:  Scheduled Meds:   . amiodarone  400 mg Oral BID  . antiseptic oral rinse  15 mL Mouth Rinse q12n4p  . aspirin  324 mg Oral Once  . colchicine  0.3 mg Oral Daily  . diltiazem  240 mg Oral Daily  . docusate sodium  100 mg Oral BID  . furosemide  20 mg Intravenous Daily  . latanoprost  1 drop Both Eyes Daily  . metoprolol tartrate  50 mg Oral Q6H  . predniSONE  40 mg Oral Q breakfast  . sodium chloride  3 mL Intravenous Q12H  . warfarin  1 mg Oral ONCE-1800  . warfarin  2.5 mg Oral ONCE-1800  . DISCONTD: colchicine  0.6 mg Oral BID   Continuous Infusions:   . sodium chloride 10 mL (10/11/11 1858)   PRN Meds:.sodium chloride, acetaminophen, alum & mag hydroxide-simeth, HYDROcodone-acetaminophen, morphine, promethazine, promethazine, sodium chloride Assessment/Plan:  1. Atrial fibrillation with RVR: Patient has a known history of atrial fibrillation and is on chronic Coumadin, metoprolol and Cardizem at home.  She presents to the ER with symptoms of  chest discomfort associated with nausea and palpitations, and was found to be in atrial fibrillation with RVR( HR in 160's). She was started on Cardizem drip. Her CHADS2 score is 4 ( ? cerebellar infarct on CT- head seen in MRI in 10/09).  Unsuccessful attempted electrical cardioversion in 04/11. Her last echo from 05/11 was reviewed with EF- 65-70% and no regional wall motion abnormalities. Troponin is negative X 3. TSH normal. Her INR at presentation was 6.64. Gave 5mg  Vitamin K on 10/12/11. Her INR is down to 2.0  Repeated INR is 1.99. Then restarted coumadin on 10/13/11.   after stopped Cardizem drip, patient was started on 3 medications, including 240 mg Cardizem po daily; Metoprolol 50 mg q6h and amiodarone 400 mg po bid. Today her HR is well controlled at around 80/min. I spoke with Dr. Algie Coffer, OK to discharge patient today. Dr. Algie Coffer recommend following regimen when discharge patient.  240 mg Cardizem po daily;   Metoprolol 50 mg q6 Amiodarone: use 200 mg tablet, take 2 tablets (total 400 mg)  twice daily for two days; then take 1 tablet (200 mg) twice daily for 1 week, then take 1 tablet daily until follow-up with Dr. Algie Coffer.    -2. Hypertension: BP 153/87 this AM  -adjusted Metoprolol dose. Will monitor  3. Diastolic CHF: Currently stable. No signs of fluid overload on exam. -Strict I and O's , daily weights. -Continue medical management - BB- blocker, ASA. -decrease lasix at to 20 daily  4. Chronic renal insufficiency: Baseline Cr-1.4- 1.5.  Her current Cr 1.24  is close to the baseline. -Continue to monitor.  5. Hypokalemia: likely 2/2 diuretics. resolved    6. Risk stratification; LDL 58 , A1C 6.2, THS 2.328  7. Rheumatoid arthritis and gout: She has evidence of classic ulnar deviation on exam. On discharge, will taper her prednisone ( take 30 mg on 12/15, 12/16, 12/17, then take 20 mg on 12/18, 12/19 and 12/20, then take 10 mg on 1/21, 12/22 and 12/23, then stop). According  to pharmacy, decrease her colchicine dose to 0.3 gaily due to both  her elevated creatinine and combination of amiodarone.   8. upratherapeutic INR: resolved. Restarted coumadin on 10/13/2011. INR is 1.25 today.  I spoke with pharmacy about the dose of  coumadin on discharge. Pharmacy recommend 2.5 mg po daily and recheck her INR at 10/18/11.             LOS: 4 days   Lorretta Harp 10/14/2011, 12:36 PM

## 2011-10-14 NOTE — Progress Notes (Signed)
ANTICOAGULATION CONSULT NOTE - Follow Up Consult  Pharmacy Consult for Coumadin Indication: atrial fibrillation  No Known Allergies  Vital Signs: Temp: 97.6 F (36.4 C) (12/14 0747) Temp src: Oral (12/14 0747) BP: 162/93 mmHg (12/14 0747) Pulse Rate: 83  (12/14 0747)  Labs:  Basename 10/14/11 0530 10/13/11 2125 10/13/11 1402 10/13/11 0658 10/13/11 0540 10/12/11 0412  HGB -- -- -- -- -- --  HCT -- -- -- -- -- --  PLT -- -- -- -- -- --  APTT -- -- -- -- -- --  LABPROT 16.0* -- -- 22.9* 23.0* --  INR 1.25 -- -- 1.99* 2.00* --  HEPARINUNFRC -- -- -- -- -- --  CREATININE 1.24* -- -- -- 1.37* 1.44*  CKTOTAL 106 141 114 -- -- --  CKMB 2.6 2.4 2.2 -- -- --  TROPONINI <0.30 <0.30 <0.30 -- -- --   Estimated Creatinine Clearance: 31.2 ml/min (by C-G formula based on Cr of 1.24).   Medications:  Scheduled:    . amiodarone  400 mg Oral BID  . antiseptic oral rinse  15 mL Mouth Rinse q12n4p  . aspirin  324 mg Oral Once  . colchicine  0.6 mg Oral BID  . diltiazem  240 mg Oral Daily  . docusate sodium  100 mg Oral BID  . furosemide  20 mg Intravenous Daily  . latanoprost  1 drop Both Eyes Daily  . metoprolol tartrate  50 mg Oral Q6H  . predniSONE  40 mg Oral Q breakfast  . sodium chloride  3 mL Intravenous Q12H  . warfarin  1 mg Oral ONCE-1800  . DISCONTD: digoxin  0.125 mg Oral Daily   Infusions:    . sodium chloride 10 mL (10/11/11 1858)  . DISCONTD: diltiazem (CARDIZEM) infusion Stopped (10/12/11 1757)    Assessment: Coumadin was restarted yesterday for afib. It has been on hold since admission due to supratherapeutic INR. She previously received 5mg  of vit k so INR is reversed. She is now on amiodarone which is a new medication for her. This medication will significantly interact with coumadin. Will dose coumadin a little more cautiously.  Goal of Therapy:  INR 2-3   Plan:  1. Coumadin 2.5mg  PO qday 2. Cont daily PT/INR  Ulyses Southward Theodore 10/14/2011,8:23 AM

## 2011-10-14 NOTE — Progress Notes (Signed)
Internal Medicine Teaching Service Attending Note Date: 10/14/2011  Patient name: Kim [name]  Medical record number: [id number]  Date of birth: July 03, 1929    This patient has been seen and discussed with the house staff. Please see their note for complete details. I concur with their findings with the following additions/corrections:  Patient's heart rate now is finally staying in the 70-80's. She is still in atrial fibrillation. We will discharge today on new amiodarone in addition to her beta blocker and calcium channel blocker that she came in on. Her gout is better on prednisone and night float had put her on colchicine .6mg  BID. We will discharge her on .3mg  qd.  Marjon Doxtater E 10/14/2011, 12:13 PM

## 2011-10-15 DIAGNOSIS — R791 Abnormal coagulation profile: Secondary | ICD-10-CM | POA: Diagnosis present

## 2011-10-15 NOTE — Discharge Summary (Signed)
Patient Name:  Kim [name]  MRN: [id number]  PCP: Leodis Sias, MD  DOB:  [birth year]   CSN: [id number]   Date of Admission:  10/10/2011  Date of Discharge:  10/14/2011      Attending Physician: Dr. Coralee Pesa MD, Joen Laura E   DISCHARGE DIAGNOSES: Principal Problem:  *Atrial fibrillation with RVR Active Problems:  HYPERLIPIDEMIA super therapeutic INR  HYPERTENSION Rheumatoid arthritis Gout Chronic renal insufficiency Diastolic CHF:   DISPOSITION AND FOLLOW-UP:  Kim [name] is to follow-up with the listed providers as detailed below. In this visit, please re-check patient's INR. I spoke with Dr. Algie Coffer on the phone. In this visit, Dr. Algie Coffer will evaluate and adjust her medications, also will re-check her INR.   Follow-up Information    Follow up with [facility], MD on 10/18/2011. (at 2:00 PM)    Contact information:   [address] [place] [address] [phone]         Discharge Orders    Future Orders Please Complete By Expires   Diet - low sodium heart healthy      Increase activity slowly          DISCHARGE MEDICATIONS: Discharge Medication List as of 10/14/2011  2:48 PM    START taking these medications   Details  acetaminophen (TYLENOL) 325 MG tablet Take 2 tablets (650 mg total) by mouth every 6 (six) hours as needed., Starting 10/14/2011, Until Mon 10/24/11, Normal    alum & mag hydroxide-simeth (MAALOX/MYLANTA) 200-200-20 MG/5ML suspension Take 30 mLs by mouth every 6 (six) hours as needed (dyspepsia)., Starting 10/14/2011, Until Mon 10/24/11, Normal    amiodarone (PACERONE) 400 MG tablet Take 0.5 tablets (200 mg total) by mouth 2 (two) times daily., Starting 10/14/2011, Until Sat 10/13/12, Print    aspirin 81 MG chewable tablet Chew 4 tablets (324 mg total) by mouth once., Starting 10/14/2011, Until Sat 10/13/12, Normal    diltiazem (CARDIZEM CD) 240 MG 24 hr capsule Take 1 capsule (240 mg total) by  mouth daily., Starting 10/14/2011, Until Sat 10/13/12, Normal    HYDROcodone-acetaminophen (NORCO) 5-325 MG per tablet Take 1-2 tablets by mouth every 6 (six) hours as needed., Starting 10/14/2011, Until Mon 10/24/11, Print    predniSONE (DELTASONE) 20 MG tablet Take 2 tablets (40 mg total) by mouth daily with breakfast., Starting 10/14/2011, Until Mon 10/24/11, Normal    promethazine (PHENERGAN) 12.5 MG tablet Take 1 tablet (12.5 mg total) by mouth every 6 (six) hours as needed for nausea., Starting 10/14/2011, Until Fri 10/21/11, Normal      CONTINUE these medications which have CHANGED   Details  colchicine 0.6 MG tablet Take 0.5 tablets (0.3 mg total) by mouth daily., Starting 10/14/2011, Until Sat 10/13/12, Normal    metoprolol (LOPRESSOR) 50 MG tablet Take 1 tablet (50 mg total) by mouth every 6 (six) hours., Starting 10/14/2011, Until Sat 10/13/12, Normal    warfarin (COUMADIN) 5 MG tablet Take 0.5 tablets (2.5 mg total) by mouth daily. Take as directed, Starting 10/14/2011, Until Discontinued, Print      CONTINUE these medications which have NOT CHANGED   Details  cloNIDine (CATAPRES) 0.2 MG tablet Take 1 tablet (0.2 mg total) by mouth 2 (two) times daily., Starting 12/27/2010, Until Discontinued, Print    furosemide (LASIX) 40 MG tablet Take 40 mg by mouth 2 (two) times daily.  , Starting 12/28/2010, Until Thu 12/29/11, Historical Med    latanoprost (XALATAN) 0.005 % ophthalmic solution Place 1 drop into both  eyes daily. Use as directed , Starting 12/28/2010, Until Thu 12/29/11, Historical Med    potassium chloride SA (K-DUR,KLOR-CON) 20 MEQ tablet Take 40 mEq by mouth 2 (two) times daily.  , Starting 06/02/2011, Until Discontinued, Historical Med    ranitidine (ZANTAC) 150 MG tablet Take 150 mg by mouth daily as needed. , Until Discontinued, Historical Med      STOP taking these medications     diltiazem (CARDIZEM) 60 MG tablet          CONSULTS: Cardiology was consulted.  Dr. Algie Coffer saw patient.     PROCEDURES PERFORMED:  Dg Chest Portable 1 View  10/10/2011  *RADIOLOGY REPORT*  Clinical Data: Chest pain  PORTABLE CHEST - 1 VIEW  Comparison: 03/16/2010  Findings: Stable asymmetric elevation right hemidiaphragm. The cardiopericardial silhouette is enlarged. The lungs are clear without focal infiltrate, edema, pneumothorax or pleural effusion. Telemetry leads overlie the chest.  IMPRESSION: Stable.  No acute findings.  Original Report Authenticated By: ERIC A. MANSELL, M.D.      ADMISSION DATA:  H&P: The patient is an 77 yo woman, history of atrial fibrillation, HTN, HL, gout, and CRI, presenting with chest pain.  About 1 week prior to admission, the patient notes the development of palpitations, shortness of breath, and chest pain, described as sternal, radiating to her left chest, associated with nausea and SOB, which comes and goes many times throughout the day, and feels similar to her prior episodes of atrial fibrillation. Her symptoms have mildly worsened over the last week, prompting her to seek medical attention.  She also notes nausea, and mild headaches for the last week (similar to her chronic headaches).  She has been on home O2 at 2L [place] for the last 2 years.  The patient also notes symptoms of bilateral foot, knee, and hip pain for the last week, which she attributes to gout of OA.  She notes decreased PO intake over the last week, due to decreased appetite with her chest discomfort.  She notes no abdominal pain, vomiting, constipation, or fevers.  On arrival to the ED, the patient was found to be in afib with RVR.   Physical Exam: T: 97.8,  BP: 136/98,  HR: 160,  RR: 20,  O2 sat: 98% on 2L [place] [facility]: alert, cooperative, and in no apparent distress HEENT: pupils equal round and reactive to light, vision grossly intact, oropharynx clear and non-erythematous  Neck: supple, no lymphadenopathy, no JVD Lungs: clear to ascultation bilaterally, normal work of  respiration, no wheezes, rales, ronchi Heart: tachycardic, irregular rhythm, no murmurs, gallops, or rubs Abdomen: soft, non-tender, non-distended, normal bowel sounds Msk: Left knee tender to palpation, passive ROM limited by pain, no warmth, edema, or erythema.  Right knee less ttp, greater ROM. Extremities: no cyanosis, clubbing, or edema Neurologic: alert & oriented X3, cranial nerves II-XII intact, strength grossly intact, sensation intact to light touch  Lab results: Basic Metabolic Panel:  Basename  10/10/11 2248  10/10/11 2234   NA  139  137   K  3.4*  3.5   CL  104  100   CO2  --  21   GLUCOSE  118*  128*   BUN  23  22   CREATININE  1.50*  1.40*   CALCIUM  --  9.1   MG  --  --   PHOS  --  --    Liver Function Tests:  [facility]  10/10/11 2234   AST  40*   ALT  24   ALKPHOS  165*   BILITOT  0.8   PROT  7.4   ALBUMIN  3.3*    CBC:  Basename  10/10/11 2248  10/10/11 2234   WBC  --  7.8   NEUTROABS  --  2.1   HGB  15.6*  14.7   HCT  46.0  42.9   MCV  --  87.7   PLT  --  254    Coagulation:  Basename  10/10/11 2255   LABPROT  58.8*   INR  6.64*       HOSPITAL COURSE:  1.  Atrial fibrillation with RVR: Patient has a known history of atrial fibrillation and is on chronic Coumadin, metoprolol and Cardizem at home.  history of unsuccessful attempted electrical cardioversion in 04/11. Her last echo from 05/11 was reviewed with EF- 65-70% and no regional wall motion abnormalities. She presents to the ER with symptoms of chest discomfort associated with nausea and palpitations, and was found to be in atrial fibrillation with RVR( HR in 160's). Troponin is negative X 3. TSH normal. Her INR at presentation was 6.64. She was started on Cardizem drip. Cardiology was consulted. Dr. Algie Coffer saw patient. Dr. Algie Coffer adusted patient's treatment regimen several times. Finally, after discontinued IV cardizem,  the 3 medication regimen controlled her heart rate well, including  240 mg Cardizem po daily, Metoprolol 50 mg q6h, and Amiodarone 400 mg bid. Her heart rate was controlled at around 80/min at the day of discharge. I spoke with Dr. Algie Coffer on the phone. OK to discharge patient with recommendations at discharge as follows:   240 mg Cardizem po daily;     Metoprolol 50 mg q6h po  Amiodarone: use 200 mg tablet, take 2 tablets (total 400 mg)  twice daily for two days; then take 1 tablet (200 mg) twice daily for 1 week, then take 1 tablet daily until follow-up with Dr. Algie Coffer.    -2. Hypertension: well controlled in the hospital. Patient has been on metoprolol and Cardizem for A Fib with RVR. We closely monitored her Bp. At discharge, her bp is 153/99. She may need to further adjust her blood medications at outpatient setting.  3. Diastolic CHF:  stable. No signs of fluid overload. Continued low dose of lasix and ASA, as well as metoprolol.  . 4. Chronic renal insufficiency: Baseline Cr-1.4- 1.5.  Her Cr 1.24  is close to the baseline at discharge.   5. Hypokalemia: likely 2/2 diuretics. resolved   6. Rheumatoid arthritis and gout: She has evidence of classic ulnar deviation on exam. At discharge, will taper her prednisone ( take 30 mg on 12/15, 12/16, 12/17, then take 20 mg on 12/18, 12/19 and 12/20, then take 10 mg on 1/21, 12/22 and 12/23, then stop). According to pharmacy, decreased her colchicine dose to 0.3 gaily due to both her elevated creatinine and combination of amiodarone at discharge.   7. Supratherapeutic INR: resolved. On admission, her INR was 6.64 and further increased to 9.76 on 10/12/11. No bleeding tendency. Gave 5mg  Vitamin K on 10/12/11. Her INR was down to 2.0  Repeated INR is 1.99. Then restarted coumadin on 10/13/11.  At discharge, her INR was 1.25. I spoke with pharmacy about the dose of  coumadin on discharge. Pharmacy recommend 2.5 mg po daily and recheck her INR at 10/18/11. Dr. Algie Coffer will re-check her INR at 10/18/11 in her following up  visit.   DISCHARGE DATA: Vital Signs: BP 153/99  Pulse 104  Temp(Src) 97.9 F (36.6 C) (Oral)  Resp 16  Ht 5\' 4"  (1.626 m)  Wt 135 lb 12.9 oz (61.6 kg)  BMI 23.31 kg/m2  SpO2 96%  Labs: No results found for this or any previous visit (from the past 24 hour(s)).  Signed: Lorretta Harp, MD, PhD  PGY I, Internal Medicine Resident 10/15/2011, 10:14 AM

## 2011-10-18 ENCOUNTER — Encounter: Payer: Medicare Other | Admitting: Internal Medicine

## 2011-11-02 ENCOUNTER — Emergency Department (HOSPITAL_COMMUNITY): Payer: Medicare Other

## 2011-11-02 ENCOUNTER — Inpatient Hospital Stay (HOSPITAL_COMMUNITY)
Admission: EM | Admit: 2011-11-02 | Discharge: 2011-11-04 | DRG: 204 | Disposition: A | Discharge status: Home / Self Care | Payer: Medicare Other | Attending: Internal Medicine | Admitting: Internal Medicine

## 2011-11-02 ENCOUNTER — Encounter (HOSPITAL_COMMUNITY): Payer: Self-pay | Admitting: *Deleted

## 2011-11-02 DIAGNOSIS — Z9981 Dependence on supplemental oxygen: Secondary | ICD-10-CM

## 2011-11-02 DIAGNOSIS — I2510 Atherosclerotic heart disease of native coronary artery without angina pectoris: Secondary | ICD-10-CM | POA: Diagnosis present

## 2011-11-02 DIAGNOSIS — Z8673 Personal history of transient ischemic attack (TIA), and cerebral infarction without residual deficits: Secondary | ICD-10-CM

## 2011-11-02 DIAGNOSIS — R042 Hemoptysis: Principal | ICD-10-CM | POA: Diagnosis present

## 2011-11-02 DIAGNOSIS — E876 Hypokalemia: Secondary | ICD-10-CM | POA: Diagnosis present

## 2011-11-02 DIAGNOSIS — Z7901 Long term (current) use of anticoagulants: Secondary | ICD-10-CM

## 2011-11-02 DIAGNOSIS — I5032 Chronic diastolic (congestive) heart failure: Secondary | ICD-10-CM | POA: Diagnosis present

## 2011-11-02 DIAGNOSIS — I4891 Unspecified atrial fibrillation: Secondary | ICD-10-CM | POA: Diagnosis present

## 2011-11-02 DIAGNOSIS — M109 Gout, unspecified: Secondary | ICD-10-CM | POA: Diagnosis present

## 2011-11-02 DIAGNOSIS — R000 Tachycardia, unspecified: Secondary | ICD-10-CM | POA: Diagnosis present

## 2011-11-02 DIAGNOSIS — D649 Anemia, unspecified: Secondary | ICD-10-CM | POA: Diagnosis present

## 2011-11-02 DIAGNOSIS — I10 Essential (primary) hypertension: Secondary | ICD-10-CM | POA: Diagnosis present

## 2011-11-02 DIAGNOSIS — M069 Rheumatoid arthritis, unspecified: Secondary | ICD-10-CM | POA: Diagnosis present

## 2011-11-02 DIAGNOSIS — I509 Heart failure, unspecified: Secondary | ICD-10-CM | POA: Diagnosis present

## 2011-11-02 DIAGNOSIS — N189 Chronic kidney disease, unspecified: Secondary | ICD-10-CM | POA: Diagnosis present

## 2011-11-02 DIAGNOSIS — R791 Abnormal coagulation profile: Secondary | ICD-10-CM | POA: Diagnosis present

## 2011-11-02 DIAGNOSIS — K921 Melena: Secondary | ICD-10-CM | POA: Diagnosis present

## 2011-11-02 DIAGNOSIS — K922 Gastrointestinal hemorrhage, unspecified: Secondary | ICD-10-CM | POA: Diagnosis present

## 2011-11-02 DIAGNOSIS — N390 Urinary tract infection, site not specified: Secondary | ICD-10-CM | POA: Diagnosis present

## 2011-11-02 DIAGNOSIS — M1A9XX1 Chronic gout, unspecified, with tophus (tophi): Secondary | ICD-10-CM

## 2011-11-02 LAB — CBC
HCT: 33.9 % — ABNORMAL LOW (ref 36.0–46.0)
HCT: 30.9 % — ABNORMAL LOW (ref 36.0–46.0)
Hemoglobin: 11 g/dL — ABNORMAL LOW (ref 12.0–15.0)
MCHC: 33 g/dL (ref 30.0–36.0)
MCV: 90.9 fL (ref 78.0–100.0)
MCV: 90.1 fL (ref 78.0–100.0)
Platelets: 277 10*3/uL (ref 150–400)
Platelets: 245 10*3/uL (ref 150–400)
RBC: 3.73 MIL/uL — ABNORMAL LOW (ref 3.87–5.11)
RDW: 17.9 % — ABNORMAL HIGH (ref 11.5–15.5)
WBC: 6.1 10*3/uL (ref 4.0–10.5)

## 2011-11-02 LAB — URINALYSIS, ROUTINE W REFLEX MICROSCOPIC
Glucose, UA: NEGATIVE mg/dL
Specific Gravity, Urine: 1.02 (ref 1.005–1.030)

## 2011-11-02 LAB — URINE MICROSCOPIC-ADD ON

## 2011-11-02 LAB — DIFFERENTIAL
Eosinophils Relative: 4 % (ref 0–5)
Lymphocytes Relative: 29 % (ref 12–46)
Lymphs Abs: 1.8 10*3/uL (ref 0.7–4.0)
Monocytes Relative: 14 % — ABNORMAL HIGH (ref 3–12)

## 2011-11-02 LAB — TYPE AND SCREEN: ABO/RH(D): O POS

## 2011-11-02 LAB — BASIC METABOLIC PANEL
BUN: 22 mg/dL (ref 6–23)
CO2: 24 mEq/L (ref 19–32)
Calcium: 8.6 mg/dL (ref 8.4–10.5)
Glucose, Bld: 157 mg/dL — ABNORMAL HIGH (ref 70–99)
Potassium: 3.4 mEq/L — ABNORMAL LOW (ref 3.5–5.1)
Sodium: 137 mEq/L (ref 135–145)

## 2011-11-02 LAB — OCCULT BLOOD, POC DEVICE: Fecal Occult Bld: POSITIVE

## 2011-11-02 LAB — PROTIME-INR: Prothrombin Time: >90 seconds — ABNORMAL HIGH (ref 11.6–15.2)

## 2011-11-02 MED ORDER — ACETAMINOPHEN 650 MG RE SUPP: 650.0000 mg | Freq: Four times a day (QID) | RECTAL | Status: DC | PRN

## 2011-11-02 MED ORDER — SODIUM CHLORIDE 0.9 % IV SOLN: 250.0000 mL | INTRAVENOUS | Status: DC | PRN

## 2011-11-02 MED ORDER — COLCHICINE 0.6 MG PO TABS
0.3000 mg | ORAL_TABLET | Freq: Every day | ORAL | Status: DC
  Administered 2011-11-02: 0.3 mg via ORAL
  Filled 2011-11-02 (×2): qty 0.5

## 2011-11-02 MED ORDER — METOPROLOL TARTRATE 50 MG PO TABS
50.0000 mg | ORAL_TABLET | Freq: Four times a day (QID) | ORAL | Status: DC
  Administered 2011-11-02: 50 mg via ORAL
  Filled 2011-11-02 (×5): qty 1

## 2011-11-02 MED ORDER — PROMETHAZINE HCL 25 MG PO TABS
12.5000 mg | ORAL_TABLET | Freq: Four times a day (QID) | ORAL | Status: DC | PRN
  Administered 2011-11-02 (×2): 12.5 mg via ORAL
  Filled 2011-11-02 (×2): qty 1

## 2011-11-02 MED ORDER — ACETAMINOPHEN 325 MG PO TABS
650.0000 mg | ORAL_TABLET | Freq: Four times a day (QID) | ORAL | Status: DC | PRN
  Administered 2011-11-03 – 2011-11-04 (×2): 650 mg via ORAL
  Filled 2011-11-02 (×2): qty 2

## 2011-11-02 MED ORDER — LATANOPROST 0.005 % OP SOLN
1.0000 [drp] | Freq: Every day | OPHTHALMIC | Status: DC
  Administered 2011-11-03: 1 [drp] via OPHTHALMIC
  Filled 2011-11-02: qty 2.5

## 2011-11-02 MED ORDER — SODIUM CHLORIDE 0.9 % IJ SOLN
3.0000 mL | Freq: Two times a day (BID) | INTRAMUSCULAR | Status: DC
  Administered 2011-11-02 – 2011-11-04 (×4): 3 mL via INTRAVENOUS

## 2011-11-02 MED ORDER — DOCUSATE SODIUM 100 MG PO CAPS
100.0000 mg | ORAL_CAPSULE | Freq: Two times a day (BID) | ORAL | Status: DC
  Administered 2011-11-03: 100 mg via ORAL
  Filled 2011-11-02 (×5): qty 1

## 2011-11-02 MED ORDER — DIGOXIN 125 MCG PO TABS
125.0000 ug | ORAL_TABLET | Freq: Every day | ORAL | Status: DC
  Administered 2011-11-03 – 2011-11-04 (×2): 125 ug via ORAL
  Filled 2011-11-02 (×3): qty 1

## 2011-11-02 MED ORDER — DILTIAZEM HCL ER 240 MG PO CP24
240.0000 mg | ORAL_CAPSULE | Freq: Every day | ORAL | Status: DC
  Administered 2011-11-03 – 2011-11-04 (×2): 240 mg via ORAL
  Filled 2011-11-02 (×3): qty 1

## 2011-11-02 MED ORDER — LEVOFLOXACIN IN D5W 750 MG/150ML IV SOLN: 750.0000 mg | INTRAVENOUS | Status: DC

## 2011-11-02 MED ORDER — LEVOFLOXACIN IN D5W 750 MG/150ML IV SOLN
750.0000 mg | Freq: Once | INTRAVENOUS | Status: AC
  Administered 2011-11-03: 750 mg via INTRAVENOUS
  Filled 2011-11-02: qty 150

## 2011-11-02 MED ORDER — ONDANSETRON HCL 4 MG PO TABS: 4.0000 mg | ORAL_TABLET | Freq: Four times a day (QID) | ORAL | Status: DC | PRN

## 2011-11-02 MED ORDER — ONDANSETRON HCL 4 MG/2ML IJ SOLN: 4.0000 mg | Freq: Four times a day (QID) | INTRAMUSCULAR | Status: DC | PRN

## 2011-11-02 MED ORDER — PREDNISONE 20 MG PO TABS
20.0000 mg | ORAL_TABLET | Freq: Every day | ORAL | Status: DC
  Administered 2011-11-02 – 2011-11-03 (×2): 20 mg via ORAL
  Filled 2011-11-02 (×3): qty 1

## 2011-11-02 MED ORDER — POTASSIUM CHLORIDE CRYS ER 20 MEQ PO TBCR
40.0000 meq | EXTENDED_RELEASE_TABLET | Freq: Two times a day (BID) | ORAL | Status: DC
  Administered 2011-11-02 – 2011-11-04 (×4): 40 meq via ORAL
  Filled 2011-11-02 (×5): qty 2

## 2011-11-02 MED ORDER — PANTOPRAZOLE SODIUM 40 MG PO TBEC
40.0000 mg | DELAYED_RELEASE_TABLET | Freq: Two times a day (BID) | ORAL | Status: DC
  Administered 2011-11-02: 40 mg via ORAL
  Filled 2011-11-02: qty 1

## 2011-11-02 MED ORDER — SODIUM CHLORIDE 0.9 % IJ SOLN: 3.0000 mL | INTRAMUSCULAR | Status: DC | PRN

## 2011-11-02 MED ORDER — HYDROCODONE-ACETAMINOPHEN 5-325 MG PO TABS
1.0000 | ORAL_TABLET | Freq: Four times a day (QID) | ORAL | Status: DC | PRN
  Administered 2011-11-02 (×2): 1 via ORAL
  Filled 2011-11-02: qty 1
  Filled 2011-11-02: qty 2
  Filled 2011-11-02: qty 1

## 2011-11-02 MED ORDER — CLONIDINE HCL 0.2 MG PO TABS
0.2000 mg | ORAL_TABLET | Freq: Two times a day (BID) | ORAL | Status: DC
  Administered 2011-11-02: 0.2 mg via ORAL
  Filled 2011-11-02: qty 1

## 2011-11-02 MED ORDER — VITAMIN K1 10 MG/ML IJ SOLN
10.0000 mg | Freq: Once | INTRAVENOUS | Status: AC
  Administered 2011-11-02: 10 mg via INTRAVENOUS
  Filled 2011-11-02: qty 1

## 2011-11-02 MED ORDER — AMIODARONE HCL 200 MG PO TABS
200.0000 mg | ORAL_TABLET | Freq: Two times a day (BID) | ORAL | Status: DC
Start: 2011-11-02 — End: 2011-11-04
  Administered 2011-11-02 – 2011-11-04 (×4): 200 mg via ORAL
  Filled 2011-11-02 (×5): qty 1

## 2011-11-02 NOTE — Progress Notes (Signed)
11/02/2011 Patient transfer from the emergency room to 6700, she is alert and oriented. Patient complain of pain in the right hand, because of gout and arthritis. When patient arrive on the unit she was receiving FFP. Patient skin is fine she have bruise on right upper and inner forearm. She was placed on telemetry.  Safety video was observed.

## 2011-11-02 NOTE — ED Provider Notes (Signed)
History     CSN: [id number]  Arrival date & time 11/02/11  1308   First MD Initiated Contact with Patient 11/02/11 1441      Chief Complaint  Patient presents with  . GI Bleeding    (Consider location/radiation/quality/duration/timing/severity/associated sxs/prior treatment) HPI  82yoF h/o afib on coumadin, pulmHTN on oxygen, CAD, CVA, CHF pw concern of ?coughing up blood. Pt states that x3 days she has been clearing her throat and exp BRB in sputum. Denies actual coughing. Denies vomiting blood. Denies CP/SOB, abd pain, n/v. States that just pta she wiped after urinating and saw BRB as well. Denies BRB in stool. She denies dizziness. No fever/chills. She states she feels diffusely weak and "not well".  Denies h/o VTE in self or family. No recent hosp/surg/immob. No h/o cancer. Denies exogenous hormone use, no leg pain or swelling.  Coumadin dose increased 2 weeks ago.      Past Medical History  Diagnosis Date  . HTN (hypertension)   . AF (atrial fibrillation)     On chronic coumadin  . Diastolic heart failure     Echo 5/11: There was moderate concentric hypertrophy. Systolic function was vigorous. The best esimated ejection fraction was in the range of 65% to 70%. Wall motion was normal  . Gout   . RA (rheumatoid arthritis)   . Chronic renal insufficiency     Baseline approx: 1.7  . Glaucoma     No recent eye visits noted in EMR.   . Pulmonary artery hypertension     Per 5/11 echo, PA peak pressure: 69mm Hg  . Stroke   . Coronary artery disease     Past Surgical History  Procedure Date  . Cardiac catheterization 4/02     Minimal luminal irregularities without significant focal stenosis.  Marland Kitchen Extracapsular cataract extraction with intraocular lens 11/08    Right eye, Dr. Mitzi Davenport    Family History  Problem Relation Age of Onset  . Diabetes Mother   . Heart disease Father     History  Substance Use Topics  . Smoking status: Former Smoker    Quit date: 10/11/1971   . Smokeless tobacco: Not on file  . Alcohol Use: No    OB History    Grav Para Term Preterm Abortions TAB SAB Ect Mult Living                  Review of Systems  All other systems reviewed and are negative.   except as noted HPI   Allergies  Review of patient's allergies indicates no known allergies.  Home Medications   Current Outpatient Rx  Name Route Sig Dispense Refill  . AMIODARONE HCL 200 MG PO TABS Oral Take 200 mg by mouth 2 (two) times daily.      Marland Kitchen CLONIDINE HCL 0.2 MG PO TABS Oral Take 0.2 mg by mouth 2 (two) times daily.      . COLCHICINE 0.6 MG PO TABS Oral Take 0.3 mg by mouth daily.      Marland Kitchen DIGOXIN 0.125 MG PO TABS Oral Take 125 mcg by mouth daily.      Marland Kitchen DILTIAZEM HCL ER 240 MG PO CP24 Oral Take 240 mg by mouth daily.      . FUROSEMIDE 40 MG PO TABS Oral Take 40 mg by mouth 2 (two) times daily.      Marland Kitchen HYDROCODONE-ACETAMINOPHEN 5-325 MG PO TABS Oral Take 1-2 tablets by mouth every 6 (six) hours as needed. For pain. Patient  stopped taking because it made her sick    . LATANOPROST 0.005 % OP SOLN Both Eyes Place 1 drop into both eyes at bedtime. Use as directed    . METOPROLOL TARTRATE 50 MG PO TABS Oral Take 50 mg by mouth every 6 (six) hours.      Marland Kitchen POTASSIUM CHLORIDE CRYS ER 20 MEQ PO TBCR Oral Take 40 mEq by mouth 2 (two) times daily.      Marland Kitchen PREDNISONE 20 MG PO TABS Oral Take 20 mg by mouth daily. Taper pack. Started on 12/15 and finished 12/24     . PROMETHAZINE HCL 12.5 MG PO TABS Oral Take 12.5 mg by mouth every 6 (six) hours as needed. For nausea     . WARFARIN SODIUM 5 MG PO TABS Oral Take 5 mg by mouth daily. As directed by coumadin clinic       BP 146/92  Pulse 83  Temp(Src) 97.9 F (36.6 C) (Oral)  Resp 20  Ht 5' 0.25" (1.53 m)  SpO2 99%  Physical Exam  Nursing note and vitals reviewed. Constitutional: She is oriented to person, place, and time. She appears well-developed.  HENT:  Head: Atraumatic.  Mouth/Throat: Oropharynx is clear and  moist.  Eyes: Conjunctivae and EOM are normal. Pupils are equal, round, and reactive to light.  Neck: Normal range of motion. Neck supple.  Cardiovascular: Normal rate, regular rhythm, normal heart sounds and intact distal pulses.   Pulmonary/Chest: Effort normal and breath sounds normal. No respiratory distress. She has no wheezes. She has no rales. She exhibits tenderness.       Min anterior cw ttp  Abdominal: Soft. She exhibits no distension. There is no tenderness. There is no rebound and no guarding.  Genitourinary:       No active bleeding Small amt of BRBPR +int hemorrhoids  Musculoskeletal: Normal range of motion.  Neurological: She is alert and oriented to person, place, and time.  Skin: Skin is warm and dry. No rash noted.  Psychiatric: She has a normal mood and affect.    ED Course  Procedures (including critical care time)  Labs Reviewed  PROTIME-INR - Abnormal; Notable for the following:    Prothrombin Time >90.0 (*) REPEATED TO VERIFY   INR >10.00 (*)    All other components within normal limits  APTT - Abnormal; Notable for the following:    aPTT 173 (*)    All other components within normal limits  CBC - Abnormal; Notable for the following:    RBC 3.73 (*)    Hemoglobin 11.0 (*)    HCT 33.9 (*)    RDW 18.2 (*)    All other components within normal limits  DIFFERENTIAL - Abnormal; Notable for the following:    Monocytes Relative 14 (*)    All other components within normal limits  BASIC METABOLIC PANEL - Abnormal; Notable for the following:    Potassium 3.4 (*)    Glucose, Bld 157 (*)    Creatinine, Ser 1.37 (*)    GFR calc non Af Amer 35 (*)    GFR calc Af Amer 41 (*)    All other components within normal limits  OCCULT BLOOD, POC DEVICE  URINALYSIS, ROUTINE W REFLEX MICROSCOPIC  OCCULT BLOOD X 1 CARD TO LAB, STOOL  TYPE AND SCREEN  PREPARE FRESH FROZEN PLASMA   No results found.   1. Supratherapeutic INR   2. Hemoptysis   3. GI bleeding      MDM  Supratherapeutic  INR. Hgb down 2 Grams from previous value less than one month ago. BRBPR. Vitamin K ordered. Type and Screen, Consider FFP. Patient stable at this time, VSS. D/W Outpatient clinics who will admit patient for further w/u and evalution.        Forbes Cellar, MD 11/02/11 [phone]

## 2011-11-02 NOTE — H&P (Signed)
Hospital Admission Note Date: 11/02/2011  Patient name: Kim [name] [facility] Medical record number: [id number] Date of birth: 05-05-1929 Age: 78 y.o. Gender: female PCP: PRIBULA,CHRISTOPHER, MD  Medical Service:          Internal Medicine Teaching Service    Attending physician:  Dr. Blanch Media  1st Contact:  Dr. Quentin Ore       Pager: [phone]  2nd Contact:  Dr. Rosana Berger     Pager: [phone] After 5 pm or weekends: 1st Contact:      Pager: [phone] 2nd Contact:      Pager: [phone]  Chief Complaint:  Chief Complaint  Patient presents with  . GI Bleeding    History of Present Illness: Kim [name] is a 78 y.o.female with past medical history significant for rheumatoid arthritis, A. fib with RVR on warfarin and diastolic heart failure last echo in May 2011 with EF greater than 50%, who presents with hemoptysis for her days and hematuria x1 day.    Kim [name] states that on Sunday she noticed that she is spitting up blood. She is on chronic oxygen therapy secondary to her diastolic CHF and thought the hemoptysis was secondary to having a dry nose from her oxygen therapy. She states the blood was a moderate amount of bright and dark blood mixed with mucus. She states that on the day of admission she noticed blood on the paper after wiping post urination. She states she was recently discharged from the hospital on December 14th and was placed on a full 5 mg tablet warfarin. She normally takes one half. Of note her discharge summary does state she should take 2.5 mg of warfarin. She has had no change in her diet or change in medications. She denies any blood in her stool. She denies any focal swelling or bruising. She states she occasionally feels nauseated, though this is not worse when sitting up or standing. She states she feels generally weak, but has no chest pain chills, shortness of breath above baseline, or increased palpitations. She does have palpitations at  baseline secondary to atrial fibrillation. She states she had a headache this morning. She specifically denies at its worst headache of her life. Denies any focal neurologic signs such as weakness, paresthesias, or numbness. She states that the pain is located in her for head behind her eyes and is exactly like her previous chronic headaches she has occasionally. Kim [name] came to the ED after she noticed blood on her tissue paper with urination. There she was found to have INR greater than 10, guaiac positive stool and bright red blood per rectum on DRE. She was given 10 mg of IV vitamin K and 2 units of fresh frozen plasma.  Endorses nausea but no vomiting. She endorses some mild bilateral lower quadrant pain approximately 2-3 days ago but is worse with turning over. Pain is now currently resolved.  Review of Systems: Constitutional: Denies fever, chills. Endorses fatigue HEENT: Denies photophobia,. Endorses hemoptysis.  Respiratory: Versus baseline SOB Cardiovascular: Denies chest pain, and leg swelling. Endorses occasional palpitations , but not increased in frequency.  Gastrointestinal: endorses  nausea,  denies current vomiting, abdominal pain, diarrhea, constipation, blood in stool and abdominal distention.  Genitourinary: Denies dysuria, urgency, frequency. Endorses hematuria. Musculoskeletal:  endorses diffuse arthralgias and back pain. States has chronic joint swelling in her hands, knees, and ankles secondary to gout and rheumatoid arthritis.  Skin: Denies pallor, rash and wound.  Neurological: Denies dizziness, syncope,  light-headedness,  numbness and headaches.  endorses weakness.    Past Medical History  Diagnosis Date  . HTN (hypertension)   . AF (atrial fibrillation)     On chronic coumadin  . Diastolic heart failure     Echo 5/11: There was moderate concentric hypertrophy. Systolic function was vigorous. The best esimated ejection fraction was in the range of 65% to 70%.  Wall motion was normal  . Gout   . RA (rheumatoid arthritis)   . Chronic renal insufficiency     Baseline approx: 1.7  . Glaucoma     No recent eye visits noted in EMR.   . Pulmonary artery hypertension     Per 5/11 echo, PA peak pressure: 69mm Hg  . Stroke   . Coronary artery disease     Past Surgical History  Procedure Date  . Cardiac catheterization 4/02     Minimal luminal irregularities without significant focal stenosis.  Marland Kitchen Extracapsular cataract extraction with intraocular lens 11/08    Right eye, Dr. Mitzi Davenport    Meds: Medications Prior to Admission  Medication Dose Route Frequency Provider Last Rate Last Dose  . 0.9 %  sodium chloride infusion  250 mL Intravenous PRN Quentin Ore, MD      . acetaminophen (TYLENOL) tablet 650 mg  650 mg Oral Q6H PRN Quentin Ore, MD       Or  . acetaminophen (TYLENOL) suppository 650 mg  650 mg Rectal Q6H PRN Quentin Ore, MD      . amiodarone (PACERONE) tablet 200 mg  200 mg Oral BID Quentin Ore, MD      . cloNIDine (CATAPRES) tablet 0.2 mg  0.2 mg Oral BID Quentin Ore, MD      . colchicine tablet 0.3 mg  0.3 mg Oral Daily Quentin Ore, MD      . digoxin (LANOXIN) tablet 125 mcg  125 mcg Oral Daily Quentin Ore, MD      . diltiazem (DILACOR XR) 24 hr capsule 240 mg  240 mg Oral Daily Quentin Ore, MD      . docusate sodium (COLACE) capsule 100 mg  100 mg Oral BID Quentin Ore, MD      . HYDROcodone-acetaminophen (NORCO) 5-325 MG per tablet 1-2 tablet  1-2 tablet Oral Q6H PRN Quentin Ore, MD      . latanoprost (XALATAN) 0.005 % ophthalmic solution 1 drop  1 drop Both Eyes QHS Quentin Ore, MD      . metoprolol (LOPRESSOR) tablet 50 mg  50 mg Oral QID Quentin Ore, MD      . ondansetron [facility]) tablet 4 mg  4 mg Oral Q6H PRN Quentin Ore, MD       Or  . ondansetron [facility]) injection 4 mg  4 mg Intravenous Q6H PRN Quentin Ore, MD      . pantoprazole (PROTONIX) EC tablet 40 mg  40 mg Oral BID AC Quentin Ore, MD      . phytonadione (VITAMIN K) 10 mg in dextrose 5 % 50 mL IVPB  10 mg Intravenous Once Forbes Cellar, MD   10 mg at 11/02/11 1616  . potassium chloride SA (K-DUR,KLOR-CON) CR tablet 40 mEq  40 mEq Oral BID Quentin Ore, MD      . predniSONE (DELTASONE) tablet 20 mg  20 mg Oral Q breakfast Quentin Ore, MD      . promethazine (PHENERGAN) tablet 12.5 mg  12.5 mg Oral Q6H PRN Quentin Ore, MD      . sodium chloride 0.9 %  injection 3 mL  3 mL Intravenous Q12H Quentin Ore, MD      . sodium chloride 0.9 % injection 3 mL  3 mL Intravenous PRN Quentin Ore, MD       Medications Prior to Admission  Medication Sig Dispense Refill  . furosemide (LASIX) 40 MG tablet Take 40 mg by mouth 2 (two) times daily.        Marland Kitchen latanoprost (XALATAN) 0.005 % ophthalmic solution Place 1 drop into both eyes at bedtime. Use as directed      . potassium chloride SA (K-DUR,KLOR-CON) 20 MEQ tablet Take 40 mEq by mouth 2 (two) times daily.        Marland Kitchen DISCONTD: furosemide (LASIX) 40 MG tablet Take 1 tablet (40 mg total) by mouth 2 (two) times daily.  180 tablet  4  . DISCONTD: latanoprost (XALATAN) 0.005 % ophthalmic solution Place 1 drop into both eyes daily. Use as directed  2.5 mL  12    Allergies: Review of patient's allergies indicates no known allergies.  he states he has an intolerance to by mouth pain medicines. They make her nauseated.  Family History  Problem Relation Age of Onset  . Diabetes Mother   . Heart disease Father 20    Currently deceased  . Stroke Sister 50  . Stroke Mother 33    History   Social History  . Marital Status: Widowed    Spouse Name: N/A    Number of Children: N/A  . Years of Education: N/A   Occupational History  . 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    Social History Main Topics  . Smoking status: Former Smoker    Types: Cigarettes    Quit date: 10/11/1971  . Smokeless tobacco: Not on file  . Alcohol Use: No  . Drug Use: No  .  Sexually Active: No   Other Topics Concern  . Not on file   Social History Narrative   Financial assistance approved for 25% discount after Medicare pays for [facility] only, not eligible for [facility] card.Has [place] health care, medicare.  Currently lives with her daughter and her 2 granddaughters. Has 3 children in town which she is close to and is able to rely on if needed.  High school education. She normally lives alone and can ambulate with a walker. She normally walks herself to the bathroom and perform basic ADLs.     Physical Exam: Blood pressure 154/90, pulse 111, temperature 98.2 F (36.8 C), temperature source Oral, resp. rate 18, height 5\' 4"  (1.626 m), weight 131 lb 2.8 oz (59.5 kg), SpO2 93.00%. Gen:  elderly female  in no acute distress; alert, appropriate and cooperative throughout examination. Head: Normocephalic, atraumatic. Eyes: PERRL,  Arcus senilis, EOMI, No signs of anemia or jaundince. Nose: Mucous membranes moist, no blood seen.  Throat: Oropharynx nonerythematous, no exudate appreciated. faint petechia in the posterior oropharynx.  no blood seen.  Lungs: Normal respiratory effort.  faint coarse inspiratory breath sounds at the bilateral lung bases.  Heart:  irregularly irregular and tachycardic.  S1 and S2 normal without  murmur,or gallop  Abdomen: BS normoactive. Soft, nondistended, non-tender. No masses or organomegaly. Extremities: No pretibial edema. she has tophaceous deformities of the left olecranon process. She has a large deviation and hyperdense nodes of her bilateral MCP joints. Bilateral knees have slight effusions. These are cool to the touch and is nontender to palpation.  the bilateral ankles are swollen without  pitting edema.  Neurologic: A&O X3, CN II - XII are grossly intact. could move all 4 extremities with only limitation secondary to arthritis pain. Sensations intact to light touch. No focal neurologic deficit Skin: No visible rashes, scars. Psych: mood  and affect are normal.     Orthostatic vital signs: Laying blood pressure 155/97 heart rate 112 Sitting blood pressure 165/86 heart rate 103 Standing blood pressure 160s over 80s heart rate in the 110s  Lab results: Basic Metabolic Panel:  Basename 11/02/11 1359  NA 137  K 3.4*  CL 98  CO2 24  GLUCOSE 157*  BUN 22  CREATININE 1.37*  CALCIUM 8.6  MG --  PHOS --   CBC:  Basename 11/02/11 1359  WBC 6.1  NEUTROABS 3.2  HGB 11.0*  HCT 33.9*  MCV 90.9  PLT 277   Coagulation:  Basename 11/02/11 1359  LABPROT >90.0*  INR >10.00*    Urinalysis: Results for Kim [name], Kim [name] (MRN [id number]) as of 11/02/2011 18:37  Ref. Range 11/02/2011 17:13  Color, Urine Latest Range: YELLOW  AMBER (A)  APPearance Latest Range: CLEAR  CLOUDY (A)  Specific Gravity, Urine Latest Range: 1.005-1.030  1.020  pH Latest Range: 5.0-8.0  5.5  Glucose, UA Latest Range: NEGATIVE mg/dL NEGATIVE  Urine Glucose, Fasting Latest Range: NEG mg/dL   Urine Glucose Latest Range: NEG mg/dL   Bilirubin Urine Latest Range: NEGATIVE  SMALL (A)  Ketones, ur Latest Range: NEGATIVE mg/dL 15 (A)  Protein Latest Range: NEGATIVE mg/dL NEGATIVE  Urobilinogen, UA Latest Range: 0.0-1.0 mg/dL 1.0  Nitrite Latest Range: NEGATIVE  POSITIVE (A)  Leukocytes, UA Latest Range: NEGATIVE  MODERATE (A)  Blood, UA Latest Range: NEG    WBC, UA Latest Range: <3 WBC/hpf 11-20  RBC / HPF Latest Range: <3 RBC/hpf 21-50  Squamous Epithelial / LPF Latest Range: RARE  FEW (A)  Bacteria, UA Latest Range: RARE  FEW (A)  Casts Latest Range: NEGATIVE  HYALINE CASTS (A)     Imaging results:  Dg Chest 2 View  11/02/2011  *RADIOLOGY REPORT*  Clinical Data: Hemoptysis.  Right-sided chest pain.  CHEST - 2 VIEW  Comparison: 10/10/2011  Findings: Mild cardiomegaly persists.  There is bronchial thickening.  There is venous hypertension with interstitial edema. Density appears more focal in the lower lobes and basilar pneumonia is not  excluded.  No effusions.  No bony abnormalities.  IMPRESSION: Venous hypertension with interstitial edema consistent with congestive heart failure.  Additionally, markings are focally prominent in the lower lobes bilaterally and there could be coexistent basilar pneumonia.  Original Report Authenticated By: Thomasenia Sales, M.D.    Other results:  EKG pending  Assessment & Plan by Problem: 78 year old woman on chronic warfarin secondary to A. fib with RVR now presents with supratherapeutic I n.r. and numerous sites of bleeding.  1) Supratherapeutic INR with hemoptysis and Hemoccult-positive stool: Kim [name] has supratherapeutic INR secondary to increase dose of warfarin after discharge 2 weeks ago. Will contact Dr. Algie Coffer to determine if further changes have been made as it is very clear in the discharge summary that she should take 2.5 mg of warfarin daily. Also as she was supratherapeutic during previous admission it is unclear why this would be increased. Currently, she had been reversed with vitamin K and fresh frozen plasma in the ED prior to evaluation. She is currently tachycardic though not orthostatic. Tachycardia is most likely secondary to A. fib with RVR she has not taken her medications today though acute  blood loss is also a concern. Blood pressure currently stable in the 150s/70s. Warfarin has been withheld for now Will follow INR until it reaches a low therapeutic value of approximately 2.  -- hold warfarin -- Check INR after FFP INR in a.m. -- CBCs per problem #2 -- Discontinue aspirin -- Patient will need very specific counseling regarding correct dose of warfarin upon discharge  2) anemia: The patient's baseline hemoglobin is approximately 13 currently down 2 grams to 11. Likely secondary to acute GI bleed in the setting of subtherapeutic INR. -- Will monitor her with q. 6 hour CBCs given acute bleed. If stable x24 hours, will decrease frequency to daily. -- Will start  pantoprazole given GI bleed and prednisone use -- Transfusion threshold to be 8 given acute or active bleeding.  3) atrial fibrillation with RVR - currently tachycardic secondary to Ms. medication doses. Otherwise stable. Will continue home therapy of amiodarone, digoxin, metoprolol and diltiazem.  -- Amiodarone 200 mg twice a day -- Digoxin 125 MCG daily -- Metoprolol 50 mg 4 times daily -- Diltiazem 240 mg daily -- Warfarin held per problem #1  4) Chronic diastolic heart failure - shortness of breath is at baseline. No crackles present on exam. Likely is volume depleted in setting of GI bleed. -- Hold furosemide -- Continue metoprolol 50 mg every 6 hours -- Will consider addition of ACE inhibitor though this would be more indicated for systolic CHF -- Patient had lipid panel on December 2012 which showed LDL of 58. Statin unlikely to have benefit at this time.  5) HYPOKALEMIA - potassium only slightly low today. Likely secondary to furosemide therapy which has been held. -- BMP in a.m. -- We'll continue to supplement 40 mEq twice a day.   6) chronic renal insufficiency - baseline creatinine approximately 1.3. Current creatinine 1.67 like is secondary to decreased by mouth intake and volume depletion. -- will check BMP in AM  7) HYPERTENSION - will restart metoprolol and diltiazem and clonidine.  -- Will continue 2 monitor blood pressure further and hold antihypertensives in the setting of hypertension.  8) rheumatoid arthritis and gout: Stable -- Continue colchicine 0.3 tablets daily -- Prednisone 40 mg every morning  9) DVT prophylaxis: None secondary to supratherapeutic INR  Signed: Juliett [name] 11/02/2011, 6:49 PM

## 2011-11-02 NOTE — ED Notes (Addendum)
Patient states she has been coughing up blood since Sunday.  She also reports noted blood in her urine today.  Patient is on coumadin.  Her last pt/inr was checked on 12-14.  Patient complains of weakness and feeling sick

## 2011-11-02 NOTE — ED Notes (Signed)
inr greater than 10 reported to Dr Hyman Hopes.

## 2011-11-02 NOTE — Progress Notes (Signed)
ANTIBIOTIC CONSULT NOTE - INITIAL  Pharmacy Consult for Levaquin Indication: PNA  No Known Allergies  Patient Measurements: Height: 5\' 4"  (162.6 cm) Weight: 131 lb 2.8 oz (59.5 kg) IBW/kg (Calculated) : 54.7   Vital Signs: Temp: 97.6 F (36.4 C) (01/02 2335) Temp src: Oral (01/02 2335) BP: 110/65 mmHg (01/02 2335) Pulse Rate: 70  (01/02 2335) Intake/Output from this shift: Total I/O In: 912.5 [I.V.:250; Blood:662.5] Out: -   Labs:  [facility] 11/02/11 2055 11/02/11 1359  WBC 7.0 6.1  HGB 10.2* 11.0*  PLT 245 277  LABCREA -- --  CREATININE -- 1.37*   Estimated Creatinine Clearance: 28.3 ml/min (by C-G formula based on Cr of 1.37).  Medical History: Past Medical History  Diagnosis Date  . HTN (hypertension)   . AF (atrial fibrillation)     On chronic coumadin  . Diastolic heart failure     Echo 5/11: There was moderate concentric hypertrophy. Systolic function was vigorous. The best esimated ejection fraction was in the range of 65% to 70%. Wall motion was normal  . Gout   . RA (rheumatoid arthritis)   . Chronic renal insufficiency     Baseline approx: 1.7  . Glaucoma     No recent eye visits noted in EMR.   . Pulmonary artery hypertension     Per 5/11 echo, PA peak pressure: 69mm Hg  . Stroke   . Coronary artery disease     Medications:  Prescriptions prior to admission  Medication Sig Dispense Refill  . amiodarone (PACERONE) 200 MG tablet Take 200 mg by mouth 2 (two) times daily.        . cloNIDine (CATAPRES) 0.2 MG tablet Take 0.2 mg by mouth 2 (two) times daily.        . colchicine 0.6 MG tablet Take 0.3 mg by mouth daily.        . digoxin (LANOXIN) 0.125 MG tablet Take 125 mcg by mouth daily.        Marland Kitchen diltiazem (DILACOR XR) 240 MG 24 hr capsule Take 240 mg by mouth daily.        . furosemide (LASIX) 40 MG tablet Take 40 mg by mouth 2 (two) times daily.        Marland Kitchen HYDROcodone-acetaminophen (NORCO) 5-325 MG per tablet Take 1-2 tablets by mouth every 6 (six)  hours as needed. For pain. Patient stopped taking because it made her sick      . latanoprost (XALATAN) 0.005 % ophthalmic solution Place 1 drop into both eyes at bedtime. Use as directed      . metoprolol (LOPRESSOR) 50 MG tablet Take 50 mg by mouth every 6 (six) hours.        . potassium chloride SA (K-DUR,KLOR-CON) 20 MEQ tablet Take 40 mEq by mouth 2 (two) times daily.        . predniSONE (DELTASONE) 20 MG tablet Take 20 mg by mouth daily. Taper pack. Started on 12/15 and finished 12/24       . promethazine (PHENERGAN) 12.5 MG tablet Take 12.5 mg by mouth every 6 (six) hours as needed. For nausea       . warfarin (COUMADIN) 5 MG tablet Take 5 mg by mouth daily. As directed by coumadin clinic        Assessment: 78 yo female with poss PNA for empiric antibiotics  Plan:  Levaquin 750 mg IV q48h F/u renal fxn and plan for anticoagulation.  F/U Qtc given home amiodarone and digoxin  Donatello Kleve, Gary Fleet  11/02/2011,11:49 PM

## 2011-11-03 DIAGNOSIS — R791 Abnormal coagulation profile: Secondary | ICD-10-CM

## 2011-11-03 LAB — CBC
HCT: 31.6 % — ABNORMAL LOW (ref 36.0–46.0)
HCT: 32.2 % — ABNORMAL LOW (ref 36.0–46.0)
Hemoglobin: 10.8 g/dL — ABNORMAL LOW (ref 12.0–15.0)
MCH: 29.8 pg (ref 26.0–34.0)
MCH: 30.3 pg (ref 26.0–34.0)
MCHC: 32.9 g/dL (ref 30.0–36.0)
MCHC: 33.2 g/dL (ref 30.0–36.0)
MCV: 90.7 fL (ref 78.0–100.0)
MCV: 90.3 fL (ref 78.0–100.0)
MCV: 90.2 fL (ref 78.0–100.0)
Platelets: 215 10*3/uL (ref 150–400)
Platelets: 241 10*3/uL (ref 150–400)
Platelets: 267 10*3/uL (ref 150–400)
Platelets: 306 10*3/uL (ref 150–400)
RDW: 18 % — ABNORMAL HIGH (ref 11.5–15.5)
RDW: 18 % — ABNORMAL HIGH (ref 11.5–15.5)
RDW: 18.3 % — ABNORMAL HIGH (ref 11.5–15.5)
RDW: 18.4 % — ABNORMAL HIGH (ref 11.5–15.5)
WBC: 3.4 10*3/uL — ABNORMAL LOW (ref 4.0–10.5)
WBC: 7.1 10*3/uL (ref 4.0–10.5)

## 2011-11-03 LAB — BASIC METABOLIC PANEL
Chloride: 98 mEq/L (ref 96–112)
Creatinine, Ser: 1.35 mg/dL — ABNORMAL HIGH (ref 0.50–1.10)
GFR calc Af Amer: 42 mL/min — ABNORMAL LOW (ref >90)
Potassium: 4 mEq/L (ref 3.5–5.1)
Sodium: 134 mEq/L — ABNORMAL LOW (ref 135–145)

## 2011-11-03 LAB — PREPARE FRESH FROZEN PLASMA: Unit division: 0

## 2011-11-03 LAB — PROTIME-INR
INR: 1.38 (ref 0.00–1.49)
Prothrombin Time: 17.2 seconds — ABNORMAL HIGH (ref 11.6–15.2)

## 2011-11-03 MED ORDER — METOPROLOL SUCCINATE ER 100 MG PO TB24
100.0000 mg | ORAL_TABLET | Freq: Two times a day (BID) | ORAL | Status: DC
  Administered 2011-11-03 (×2): 100 mg via ORAL
  Filled 2011-11-03 (×4): qty 1

## 2011-11-03 MED ORDER — PANTOPRAZOLE SODIUM 40 MG IV SOLR
40.0000 mg | Freq: Two times a day (BID) | INTRAVENOUS | Status: DC
  Administered 2011-11-03 – 2011-11-04 (×3): 40 mg via INTRAVENOUS
  Filled 2011-11-03 (×4): qty 40

## 2011-11-03 MED ORDER — DEXTROSE 5 % IV SOLN
1.0000 g | INTRAVENOUS | Status: DC
  Administered 2011-11-03 – 2011-11-04 (×2): 1 g via INTRAVENOUS
  Filled 2011-11-03 (×2): qty 10

## 2011-11-03 MED ORDER — PREDNISONE 10 MG PO TABS
10.0000 mg | ORAL_TABLET | Freq: Every day | ORAL | Status: DC
  Filled 2011-11-03: qty 1

## 2011-11-03 NOTE — Progress Notes (Addendum)
Received call re [facility] : [facility] aide services for this pt, however pt has [employer] Medicare Complete, HH aide services for personal care and for household assistance is available only with Medicaid and requires an application with the state followed by an evaluation in the home post d/c, turn around time about 4-6 weeks. This is something the family generally will need to pursue post d/c if the pt has Medicaid. Our records do not indicate that the pt has this coverage. Other HH aide services are available as private pay or her insurance may cover for a few visits post d/c if pt also has a skilled need such a Charity fundraiser or PT. Will need PT eval to determine if pt has HHPT need. No HHRN need identified on chart review. Will continue to follow. Spoke with MD re this situation.  Starlyn Skeans RN MPH Case Manager [phone] MD please provide justification for [facility] , can ask for in home assessment and evaluation, this will provide only a few visits and may not support HH aide. Will ask and await answer.  Johny Shock RN MPH [phone] Spoke with pt at length re home situation and needs. Per pt her daughter and two granddaughters live with her at this time and are able to assist with her care. Granddaughters are 58 and 55 yr of age. So she currently does not need the services of a HH aide. Talked with the pt about applying for Medicaid, for personal care services as daughter may be moving, she has been with pt for one year. Services will be limited for this pt unless she is able to qualify for Medicaid. Pt also states that social worker in [facility] also offered some services, this CM called that social worker, who had already left for the day and will call again tomorrow to clarify any available services. Johny Shock RN MPH [phone]

## 2011-11-03 NOTE — H&P (Signed)
Internal Medicine Teaching Service Attending Note Date: 11/03/2011  Patient name: Kim [name]  Medical record number: [id number]  Date of birth: Apr 03, 1929   I have seen and evaluated Kim [name] and discussed their care with the Residency Team. Please see Dr Gardiner Sleeper H&P for full details. Briefly, Kim [name] was admitted with a supratherapeutic INR with hematuria, hemoptysis, and BRBPR after rectal exam. She was admitted in Dec for AFIB with RVR and Amiodarone was started. INR was increased at that time but normalized at D/C and pt sent home on routine coumadin dose of 2.5 daily. Per pt, at cardiology F/U she was instructed to increase her coumadin to 5 mg daily as her INR was <2. She was not able to return for repeat INR bc she felt too bad (pain type sxs). She then had the bleeding episodes and presented to the ER.  Of note, she was started on a prednisone taper in the Dec admission for a gout flare and [facility] confirmed that she picked it up at the pharmacy. She would have completed the taped by now.  She has a h/o RA but that isn't clear to me after reviewing the records. She was RF negative in 12/2010 although she could have simply "burnt out" RA. Most recently, she saw Rheum in 2011 and labs were obtained inc CCP but we do not have those results. Dr Dareen Piano confirmed that she had gouty arthritis and stated that both gouty arthritis and RA in the same pt is unlikely but can happen. Past XRAYS are conflicting.   Bleeding had resolved overnight until this AM when there was a bit after blowing her nose too hard.  Physical Exam: Blood pressure 143/89, pulse 83, temperature 97.8 F (36.6 C), temperature source Oral, resp. rate 20, height 5\' 4"  (1.626 m), weight 131 lb 2.8 oz (59.5 kg), SpO2 97.00%. She is tearful on exam and seems to blame herself (I blew my nose too hard and that is why it is bleeding) HRRR (no tachy or irr irr at this time) L CTA B ABD benign Hands - tophi over  joints B. Contractures.   Lab results: Results for orders placed during the hospital encounter of 11/02/11 (from the past 24 hour(s))  PROTIME-INR     Status: Abnormal   Collection Time   11/02/11  1:59 PM      Component Value Range   Prothrombin Time >90.0 (*) 11.6 - 15.2 (seconds)   INR >10.00 (*) 0.00 - 1.49   APTT     Status: Abnormal   Collection Time   11/02/11  1:59 PM      Component Value Range   aPTT 173 (*) 24 - 37 (seconds)  CBC     Status: Abnormal   Collection Time   11/02/11  1:59 PM      Component Value Range   WBC 6.1  4.0 - 10.5 (K/uL)   RBC 3.73 (*) 3.87 - 5.11 (MIL/uL)   Hemoglobin 11.0 (*) 12.0 - 15.0 (g/dL)   HCT 30.8 (*) 65.7 - 46.0 (%)   MCV 90.9  78.0 - 100.0 (fL)   MCH 29.5  26.0 - 34.0 (pg)   MCHC 32.4  30.0 - 36.0 (g/dL)   RDW 84.6 (*) 96.2 - 15.5 (%)   Platelets 277  150 - 400 (K/uL)  DIFFERENTIAL     Status: Abnormal   Collection Time   11/02/11  1:59 PM      Component Value Range   Neutrophils Relative  53  43 - 77 (%)   Neutro Abs 3.2  1.7 - 7.7 (K/uL)   Lymphocytes Relative 29  12 - 46 (%)   Lymphs Abs 1.8  0.7 - 4.0 (K/uL)   Monocytes Relative 14 (*) 3 - 12 (%)   Monocytes Absolute 0.8  0.1 - 1.0 (K/uL)   Eosinophils Relative 4  0 - 5 (%)   Eosinophils Absolute 0.2  0.0 - 0.7 (K/uL)   Basophils Relative 1  0 - 1 (%)   Basophils Absolute 0.1  0.0 - 0.1 (K/uL)  BASIC METABOLIC PANEL     Status: Abnormal   Collection Time   11/02/11  1:59 PM      Component Value Range   Sodium 137  135 - 145 (mEq/L)   Potassium 3.4 (*) 3.5 - 5.1 (mEq/L)   Chloride 98  96 - 112 (mEq/L)   CO2 24  19 - 32 (mEq/L)   Glucose, Bld 157 (*) 70 - 99 (mg/dL)   BUN 22  6 - 23 (mg/dL)   Creatinine, Ser 4.09 (*) 0.50 - 1.10 (mg/dL)   Calcium 8.6  8.4 - 81.1 (mg/dL)   GFR calc non Af Amer 35 (*) >90 (mL/min)   GFR calc Af Amer 41 (*) >90 (mL/min)  OCCULT BLOOD, POC DEVICE     Status: Normal   Collection Time   11/02/11  3:31 PM      Component Value Range   Fecal Occult  Bld POSITIVE    TYPE AND SCREEN     Status: Normal   Collection Time   11/02/11  4:15 PM      Component Value Range   ABO/RH(D) O POS     Antibody Screen NEG     Sample Expiration 11/05/2011    PREPARE FRESH FROZEN PLASMA     Status: Normal   Collection Time   11/02/11  4:30 PM      Component Value Range   Unit Number [id number]     Blood Component Type THAWED PLASMA     Unit division 00     Status of Unit ISSUED,FINAL     Transfusion Status OK TO TRANSFUSE     Unit Number [id number]     Blood Component Type THAWED PLASMA     Unit division 00     Status of Unit ISSUED,FINAL     Transfusion Status OK TO TRANSFUSE    URINALYSIS, ROUTINE W REFLEX MICROSCOPIC     Status: Abnormal   Collection Time   11/02/11  5:13 PM      Component Value Range   Color, Urine AMBER (*) YELLOW    APPearance CLOUDY (*) CLEAR    Specific Gravity, Urine 1.020  1.005 - 1.030    pH 5.5  5.0 - 8.0    Glucose, UA NEGATIVE  NEGATIVE (mg/dL)   Hgb urine dipstick LARGE (*) NEGATIVE    Bilirubin Urine SMALL (*) NEGATIVE    Ketones, ur 15 (*) NEGATIVE (mg/dL)   Protein, ur NEGATIVE  NEGATIVE (mg/dL)   Urobilinogen, UA 1.0  0.0 - 1.0 (mg/dL)   Nitrite POSITIVE (*) NEGATIVE    Leukocytes, UA MODERATE (*) NEGATIVE   URINE MICROSCOPIC-ADD ON     Status: Abnormal   Collection Time   11/02/11  5:13 PM      Component Value Range   Squamous Epithelial / LPF FEW (*) RARE    WBC, UA 11-20  <3 (WBC/hpf)   RBC / HPF 21-50  <3 (  RBC/hpf)   Bacteria, UA FEW (*) RARE    Casts HYALINE CASTS (*) NEGATIVE   PROTIME-INR     Status: Abnormal   Collection Time   11/02/11  8:55 PM      Component Value Range   Prothrombin Time 20.2 (*) 11.6 - 15.2 (seconds)   INR 1.69 (*) 0.00 - 1.49   CBC     Status: Abnormal   Collection Time   11/02/11  8:55 PM      Component Value Range   WBC 7.0  4.0 - 10.5 (K/uL)   RBC 3.43 (*) 3.87 - 5.11 (MIL/uL)   Hemoglobin 10.2 (*) 12.0 - 15.0 (g/dL)   HCT 16.1 (*) 09.6 - 46.0 (%)   MCV 90.1  78.0 -  100.0 (fL)   MCH 29.7  26.0 - 34.0 (pg)   MCHC 33.0  30.0 - 36.0 (g/dL)   RDW 04.5 (*) 40.9 - 15.5 (%)   Platelets 245  150 - 400 (K/uL)  CBC     Status: Abnormal   Collection Time   11/03/11  2:38 AM      Component Value Range   WBC 3.4 (*) 4.0 - 10.5 (K/uL)   RBC 3.23 (*) 3.87 - 5.11 (MIL/uL)   Hemoglobin 9.6 (*) 12.0 - 15.0 (g/dL)   HCT 81.1 (*) 91.4 - 46.0 (%)   MCV 90.7  78.0 - 100.0 (fL)   MCH 29.7  26.0 - 34.0 (pg)   MCHC 32.8  30.0 - 36.0 (g/dL)   RDW 78.2 (*) 95.6 - 15.5 (%)   Platelets 215  150 - 400 (K/uL)  BASIC METABOLIC PANEL     Status: Abnormal   Collection Time   11/03/11  2:38 AM      Component Value Range   Sodium 134 (*) 135 - 145 (mEq/L)   Potassium 4.0  3.5 - 5.1 (mEq/L)   Chloride 98  96 - 112 (mEq/L)   CO2 23  19 - 32 (mEq/L)   Glucose, Bld 156 (*) 70 - 99 (mg/dL)   BUN 22  6 - 23 (mg/dL)   Creatinine, Ser 2.13 (*) 0.50 - 1.10 (mg/dL)   Calcium 8.7  8.4 - 08.6 (mg/dL)   GFR calc non Af Amer 36 (*) >90 (mL/min)   GFR calc Af Amer 42 (*) >90 (mL/min)  PROTIME-INR     Status: Abnormal   Collection Time   11/03/11  2:38 AM      Component Value Range   Prothrombin Time 17.2 (*) 11.6 - 15.2 (seconds)   INR 1.38  0.00 - 1.49   CBC     Status: Abnormal   Collection Time   11/03/11  8:45 AM      Component Value Range   WBC 4.2  4.0 - 10.5 (K/uL)   RBC 3.56 (*) 3.87 - 5.11 (MIL/uL)   Hemoglobin 10.6 (*) 12.0 - 15.0 (g/dL)   HCT 57.8 (*) 46.9 - 46.0 (%)   MCV 90.4  78.0 - 100.0 (fL)   MCH 29.8  26.0 - 34.0 (pg)   MCHC 32.9  30.0 - 36.0 (g/dL)   RDW 62.9 (*) 52.8 - 15.5 (%)   Platelets 241  150 - 400 (K/uL)    Imaging results:  Dg Chest 2 View  11/02/2011  *RADIOLOGY REPORT*  Clinical Data: Hemoptysis.  Right-sided chest pain.  CHEST - 2 VIEW  Comparison: 10/10/2011  Findings: Mild cardiomegaly persists.  There is bronchial thickening.  There is venous hypertension with interstitial  edema. Density appears more focal in the lower lobes and basilar pneumonia is  not excluded.  No effusions.  No bony abnormalities.  IMPRESSION: Venous hypertension with interstitial edema consistent with congestive heart failure.  Additionally, markings are focally prominent in the lower lobes bilaterally and there could be coexistent basilar pneumonia.  Original Report Authenticated By: Thomasenia Sales, M.D.    Assessment and Plan: I agree with the formulated Assessment and Plan with the following changes:  1. Supratherapeutic INR - Probably multifactorial inc both the increased dose of 5 mg and plus this is about the time that her new amiodarone would kick in and increase her INR. She was corrected to 1.3 with Vit K and FFP. Her HgB decreased to 9.6 from 11 on admit and 13 which is about her baseline. She will not be restarted on her coumadin until HgB is stable and no further episodes of bleeding. She does need to be restarted though once stable and would rec that if possible give her a tab that doesn't have to be split. She needs cont on her PPI (recent prednisone course and increased age)  2. Anemia - 2/2 supratherapeutic INR. She is unlikely to benefit from GI intervention. Check anemia panel. Follow HgB closely until stable.   3. A Fib with RVR - try to simply meds by changing the QID BB dosing to QD. She will need a TSH in a few weeks as she was recently started on Amiodarone.  4. Diastolic heart failure - check Dig level. Lasix is being held until her bleeding resolves.   5. Gout +/- RA - stop prednisone as could just contribute to gastritis and further GI bleeding. She is on colchicine and probenecid although I do not see the latter on her med list. Will need to verify meds and get Dr Ewell Poe records regarding her W/U for RA. Warmth helps pain so will order heating pad.  6. Debility - she requires assistane with bathing, dressing, and cooking. She is able to walk to her bathroom at home but fell twice in Oct. She is determined to get a [employer] despite our rec to  use a local company. I believe she would benefit but would also prefer to use a local company.

## 2011-11-03 NOTE — Progress Notes (Signed)
Subjective: No hemoptysis since yesterday. No acute events overnight. Has not noticed any hematuria or blood in her stool. States feels energized today after she awoke. States feels the best she has in years. Pain decreased. Nausea present but at baseline. No CP, SOB or orthostasis.     Objective: Vital signs in last 24 hours: Filed Vitals:   11/02/11 2335 11/03/11 0030 11/03/11 0438 11/03/11 1000  BP: 110/65 114/70 140/86 143/89  Pulse: 70 80 110 83  Temp: 97.6 F (36.4 C) 98.2 F (36.8 C) 98.1 F (36.7 C) 97.8 F (36.6 C)  TempSrc: Oral Oral Oral Oral  Resp: 18 18 19 20   Height:      Weight:      SpO2: 93%  94% 97%   Weight change:   Intake/Output Summary (Last 24 hours) at 11/03/11 1114 Last data filed at 11/03/11 0015  Gross per 24 hour  Intake    974 ml  Output      0 ml  Net    974 ml   Physical Exam: General: resting in bed Cardiac: Irregularly irregular, no rubs, murmurs or gallops Pulm: clear to auscultation bilaterally, moving normal volumes of air Abd: soft, nontender, nondistended, BS present Extremities: No pretibial edema. she has tophaceous deformities of the left olecranon process. She has a large deviation and hyperdense nodes of her bilateral MCP joints. Bilateral knees have slight effusions. These are cool to the touch and is nontender to palpation. the bilateral ankles and wrists are swollen. No pitting edema.  Neuro: alert and oriented X3, cranial nerves II-XII grossly intact  Lab Results: Basic Metabolic Panel:  Lab 11/03/11 7425 11/02/11 1359  NA 134* 137  K 4.0 3.4*  CL 98 98  CO2 23 24  GLUCOSE 156* 157*  BUN 22 22  CREATININE 1.35* 1.37*  CALCIUM 8.7 8.6  MG -- --  PHOS -- --   CBC:  Lab 11/03/11 0845 11/03/11 0238 11/02/11 1359  WBC 4.2 3.4* --  NEUTROABS -- -- 3.2  HGB 10.6* 9.6* --  HCT 32.2* 29.3* --  MCV 90.4 90.7 --  PLT 241 215 --   Coagulation:  Lab 11/03/11 0238 11/02/11 2055 11/02/11 1359  LABPROT 17.2* 20.2* >90.0*   INR 1.38 1.69* >10.00*   Urinalysis:   Ref. Range  11/02/2011 17:13   Color, Urine  Latest Range: YELLOW   AMBER (A)   APPearance  Latest Range: CLEAR   CLOUDY (A)   Specific Gravity, Urine  Latest Range: 1.005-1.030   1.020   pH  Latest Range: 5.0-8.0   5.5   Glucose, UA  Latest Range: NEGATIVE mg/dL  NEGATIVE   Urine Glucose, Fasting  Latest Range: NEG mg/dL     Urine Glucose  Latest Range: NEG mg/dL     Bilirubin Urine  Latest Range: NEGATIVE   SMALL (A)   Ketones, ur  Latest Range: NEGATIVE mg/dL  15 (A)   Protein  Latest Range: NEGATIVE mg/dL  NEGATIVE   Urobilinogen, UA  Latest Range: 0.0-1.0 mg/dL  1.0   Nitrite  Latest Range: NEGATIVE   POSITIVE (A)   Leukocytes, UA  Latest Range: NEGATIVE   MODERATE (A)   Blood, UA  Latest Range: NEG      WBC, UA  Latest Range: <3 WBC/hpf  11-20   RBC / HPF  Latest Range: <3 RBC/hpf  21-50   Squamous Epithelial / LPF  Latest Range: RARE   FEW (A)   Bacteria, UA  Latest Range: RARE  FEW (A)   Casts  Latest Range: NEGATIVE   HYALINE CASTS (A)     Micro Results: No results found for this or any previous visit (from the past 240 hour(s)). Studies/Results: Dg Chest 2 View  11/02/2011  *RADIOLOGY REPORT*  Clinical Data: Hemoptysis.  Right-sided chest pain.  CHEST - 2 VIEW  Comparison: 10/10/2011  Findings: Mild cardiomegaly persists.  There is bronchial thickening.  There is venous hypertension with interstitial edema. Density appears more focal in the lower lobes and basilar pneumonia is not excluded.  No effusions.  No bony abnormalities.  IMPRESSION: Venous hypertension with interstitial edema consistent with congestive heart failure.  Additionally, markings are focally prominent in the lower lobes bilaterally and there could be coexistent basilar pneumonia.  Original Report Authenticated By: Thomasenia Sales, M.D.   Medications: I have reviewed the patient's current medications. Scheduled Meds:    . amiodarone  200 mg Oral BID  . cefTRIAXone  (ROCEPHIN)  IV  1 g Intravenous Q24H  . digoxin  125 mcg Oral Daily  . diltiazem  240 mg Oral Daily  . docusate sodium  100 mg Oral BID  . latanoprost  1 drop Both Eyes QHS  . levofloxacin (LEVAQUIN) IV  750 mg Intravenous Once  . metoprolol succinate  100 mg Oral BID  . pantoprazole (PROTONIX) IV  40 mg Intravenous Q12H  . phytonadione (VITAMIN K) IV  10 mg Intravenous Once  . potassium chloride SA  40 mEq Oral BID  . predniSONE  10 mg Oral Q breakfast  . sodium chloride  3 mL Intravenous Q12H  . DISCONTD: cloNIDine  0.2 mg Oral BID  . DISCONTD: colchicine  0.3 mg Oral Daily  . DISCONTD: levofloxacin (LEVAQUIN) IV  750 mg Intravenous Q48H  . DISCONTD: metoprolol  50 mg Oral QID  . DISCONTD: pantoprazole  40 mg Oral BID AC  . DISCONTD: predniSONE  20 mg Oral Q breakfast   Continuous Infusions:  PRN Meds:.sodium chloride, acetaminophen, acetaminophen, HYDROcodone-acetaminophen, ondansetron (ZOFRAN) IV, ondansetron, promethazine, sodium chloride Assessment/Plan: Principal Problem: 1) Supratherapeutic INR with hemoptysis, hemoccult-positive stool and anemia: Likely multifactorial secondary to not being able to go to her followup appointment with Dr. Algie Coffer. Also addition of amiodarone approximately 4-6 weeks ago could be affecting INR levels. Successfully reversed with vitamin K and 2 units of fresh frozen plasma. INR now subtherapeutic at 1.38.  Patient remains asymptomatic from her anemia and hemoglobin has stabilized now at 10.6. We'll continue to monitor hemoglobin today to ensure no further blood loss and will likely restart warfarin tomorrow. She will need Lovenox bridge until INR is therapeutic. -- Continue every 6 CBCs -- Hold warfarin -- Will obtain records from Dr. Algie Coffer. -- Continue twice daily IV pantoprazole  2) atrial fibrillation with RVR -tachycardia improved though still not optimal. Otherwise stable. Given questionable compliance, we will change metoprolol from 4 times  a day to metoprolol XL.  Will start with metoprolol XL 100 mg twice daily. If she tolerates this well we will move to metoprolol XL 200 mg once daily. Will continue home therapy of amiodarone, digoxin, and diltiazem.  -- Amiodarone 200 mg twice a day  -- Digoxin 125 MCG daily  -- Metoprolol XL 100 mg twice daily -- Diltiazem 240 mg daily  -- Warfarin held per problem #1 -- Will need repeat TSH in 3 weeks as an outpatient secondary to new start of amiodarone  3) Chronic diastolic heart failure - stable. -- Hold furosemide -- Continue metoprolol  4) question UTI - the patient is asymptomatic at this time given her other comorbidities she is currently being treated with ceftriaxone. Will discharge on Ceftin. Antibiotic day one. -- Ceftriaxone day one  5) rheumatoid arthritis and gout - stable. Prednisone was started at previous admission and taper should have been completed by now.  -- Will obtain records from Dr. Dareen Piano who completed rheumatology workup in February of 2011. -- DC prednisone. -- Continue colchicine -- Will contact case management for home health aid  6) HYPOKALEMIA - resolved -- Daily potassium supplementation  7) HYPERTENSION - well controlled. Given decrease in blood pressure and change in beta blocker, we will hold clonidine for now. Continue metoprolol XL and diltiazem. -- Will continue 2 monitor blood pressure further and hold antihypertensives in the setting of hypertension.  8) chronic renal insufficiency - at baseline creatinine   9) DVT prophylaxis  - SCDs    LOS: 1 day   Tiana Sivertson 11/03/2011, 11:14 AM

## 2011-11-04 ENCOUNTER — Telehealth: Payer: Self-pay | Admitting: *Deleted

## 2011-11-04 LAB — IRON AND TIBC
Saturation Ratios: 26 % (ref 20–55)
TIBC: 289 ug/dL (ref 250–470)

## 2011-11-04 LAB — BASIC METABOLIC PANEL
BUN: 27 mg/dL — ABNORMAL HIGH (ref 6–23)
CO2: 20 mEq/L (ref 19–32)
Chloride: 102 mEq/L (ref 96–112)
Creatinine, Ser: 1.65 mg/dL — ABNORMAL HIGH (ref 0.50–1.10)
GFR calc Af Amer: 33 mL/min — ABNORMAL LOW (ref >90)
Glucose, Bld: 96 mg/dL (ref 70–99)
Potassium: 4.8 mEq/L (ref 3.5–5.1)

## 2011-11-04 LAB — RETICULOCYTES: Retic Ct Pct: 5.6 % — ABNORMAL HIGH (ref 0.4–3.1)

## 2011-11-04 LAB — CBC
HCT: 33 % — ABNORMAL LOW (ref 36.0–46.0)
Hemoglobin: 10.7 g/dL — ABNORMAL LOW (ref 12.0–15.0)
Hemoglobin: 10.7 g/dL — ABNORMAL LOW (ref 12.0–15.0)
MCH: 29.6 pg (ref 26.0–34.0)
MCH: 29.9 pg (ref 26.0–34.0)
MCHC: 32.4 g/dL (ref 30.0–36.0)
MCHC: 33.2 g/dL (ref 30.0–36.0)
MCV: 89.9 fL (ref 78.0–100.0)
MCV: 91.2 fL (ref 78.0–100.0)
RBC: 3.58 MIL/uL — ABNORMAL LOW (ref 3.87–5.11)
RBC: 3.62 MIL/uL — ABNORMAL LOW (ref 3.87–5.11)

## 2011-11-04 LAB — URINE CULTURE: Culture  Setup Time: 201301030854

## 2011-11-04 LAB — VITAMIN B12: Vitamin B-12: 960 pg/mL — ABNORMAL HIGH (ref 211–911)

## 2011-11-04 LAB — FERRITIN: Ferritin: 301 ng/mL — ABNORMAL HIGH (ref 10–291)

## 2011-11-04 LAB — PROTIME-INR: INR: 1.21 (ref 0.00–1.49)

## 2011-11-04 MED ORDER — DSS 100 MG PO CAPS: 100.0000 mg | ORAL_CAPSULE | Freq: Two times a day (BID) | ORAL | Status: AC

## 2011-11-04 MED ORDER — RIVAROXABAN 15 MG PO TABS: 15.0000 mg | ORAL_TABLET | Freq: Every day | ORAL | Status: DC

## 2011-11-04 MED ORDER — METOPROLOL SUCCINATE ER 100 MG PO TB24
200.0000 mg | ORAL_TABLET | Freq: Every day | ORAL | Status: DC
  Filled 2011-11-04: qty 2

## 2011-11-04 MED ORDER — CLONIDINE HCL 0.3 MG PO TABS
0.3000 mg | ORAL_TABLET | Freq: Two times a day (BID) | ORAL | Status: DC
  Filled 2011-11-04 (×2): qty 1

## 2011-11-04 MED ORDER — WARFARIN SODIUM 2.5 MG PO TABS
2.5000 mg | ORAL_TABLET | Freq: Every day | ORAL | Status: DC
  Filled 2011-11-04: qty 1

## 2011-11-04 MED ORDER — METOPROLOL SUCCINATE ER 100 MG PO TB24
200.0000 mg | ORAL_TABLET | Freq: Every day | ORAL | Status: DC
  Administered 2011-11-04: 200 mg via ORAL
  Filled 2011-11-04: qty 2

## 2011-11-04 MED ORDER — CLONIDINE HCL 0.2 MG PO TABS
0.2000 mg | ORAL_TABLET | Freq: Two times a day (BID) | ORAL | Status: DC
  Administered 2011-11-04: 0.2 mg via ORAL
  Filled 2011-11-04 (×2): qty 1

## 2011-11-04 MED ORDER — METOPROLOL SUCCINATE ER 200 MG PO TB24: 200.0000 mg | ORAL_TABLET | Freq: Every day | ORAL | Status: DC

## 2011-11-04 NOTE — Progress Notes (Addendum)
Late entry: spoke with social worker in [facility] re her discussion with pt and services she may have offered pt, however Lorri Frederick CSW in [facility] reports that she has not spoken with pt since Oct 2011, per her notes. She has not suggestions other than pt applying for Medicaid and she had discussed with family the possibility of PACE program, however pt does not have Medicaid so would have a significant out of pocket cost for this program. Currently pt has family , one daughter and two granddaughters to support her. She states that they do a wonderful job but all work or go to school. Noted that pt declined PT eval so we are unable to establish a HHPT or a HHRN need therefore we are unable to request [facility] aide services.  Starlyn Skeans RN MPH Case Manager [phone]      CARE MANAGEMENT NOTE 11/04/2011  Patient:  Kim [name], Kim [name]   Account Number:  [id number]  Date Initiated:  11/03/2011  Documentation initiated by:  Johny Shock  Subjective/Objective Assessment:   Order for [facility] aide     Action/Plan:   Spoke with MD and explained that pt doe not have coverage for [facility] aide, may request some assistance if pt qualifies for [facility] and/or HHPT.   Anticipated DC Date:  11/04/2011   Anticipated DC Plan:  HOME/SELF CARE         Choice offered to / List presented to:             Status of service:  Completed, signed off Medicare Important Message given?   (If response is "NO", the following Medicare IM given date fields will be blank) Date Medicare IM given:   Date Additional Medicare IM given:    Discharge Disposition:  HOME/SELF CARE  Per UR Regulation:    Comments:  11/03/2010 Unable to identify HHRN need and pt decline eval for HHPT, stating that she was at baseline and did not require that service, therefore unable to request [facility] aide to assist, this would have been very limited at best. Pt was encouraged to apply for Medicaid to assist with future needs if pt  becomes more debilitated and/or family not available. Johny Shock RN MPH Case Manager [phone]

## 2011-11-04 NOTE — Plan of Care (Signed)
Problem: Discharge Progression Outcomes Goal: Other Discharge Outcomes/Goals Follow up appoinmments with cardiologist and [facility] clinic on place

## 2011-11-04 NOTE — Progress Notes (Signed)
Junita Push, PharmD, BCPS

## 2011-11-04 NOTE — Progress Notes (Signed)
ANTICOAGULATION CONSULT NOTE - Initial Consult  Pharmacy Consult for Coumadin Indication: atrial fibrillation  No Known Allergies  Patient Measurements: Height: 5\' 4"  (162.6 cm) Weight: 142 lb 6.7 oz (64.6 kg) IBW/kg (Calculated) : 54.7   Vital Signs: Temp: 98 F (36.7 C) (01/04 0432) Temp src: Oral (01/04 0432) BP: 162/100 mmHg (01/04 0447) Pulse Rate: 110  (01/04 0447)  Labs:  Basename 11/04/11 0620 11/03/11 2340 11/03/11 1858 11/03/11 0238 11/02/11 2055 11/02/11 1359  HGB 10.7* 10.7* -- -- -- --  HCT 33.0* 32.2* 32.2* -- -- --  PLT 325 287 306 -- -- --  APTT -- -- -- -- -- 173*  LABPROT 15.6* -- -- 17.2* 20.2* --  INR 1.21 -- -- 1.38 1.69* --  HEPARINUNFRC -- -- -- -- -- --  CREATININE 1.65* -- -- 1.35* -- 1.37*  CKTOTAL -- -- -- -- -- --  CKMB -- -- -- -- -- --  TROPONINI -- -- -- -- -- --   Estimated Creatinine Clearance: 23.5 ml/min (by C-G formula based on Cr of 1.65).  Medical History: Past Medical History  Diagnosis Date   HTN (hypertension)    AF (atrial fibrillation)     On chronic coumadin   Diastolic heart failure     Echo 5/11: There was moderate concentric hypertrophy. Systolic function was vigorous. The best esimated ejection fraction was in the range of 65% to 70%. Wall motion was normal   Gout    RA (rheumatoid arthritis)    Chronic renal insufficiency     Baseline approx: 1.7   Glaucoma     No recent eye visits noted in EMR.    Pulmonary artery hypertension     Per 5/11 echo, PA peak pressure: 69mm Hg   Stroke    Coronary artery disease     Medications:  Scheduled:     amiodarone  200 mg Oral BID   cefTRIAXone (ROCEPHIN)  IV  1 g Intravenous Q24H   cloNIDine  0.2 mg Oral BID   digoxin  125 mcg Oral Daily   diltiazem  240 mg Oral Daily   docusate sodium  100 mg Oral BID   latanoprost  1 drop Both Eyes QHS   metoprolol succinate  200 mg Oral Daily   pantoprazole (PROTONIX) IV  40 mg Intravenous Q12H   potassium  chloride SA  40 mEq Oral BID   sodium chloride  3 mL Intravenous Q12H   DISCONTD: cloNIDine  0.3 mg Oral BID   DISCONTD: metoprolol succinate  100 mg Oral BID   DISCONTD: metoprolol succinate  200 mg Oral Daily   DISCONTD: predniSONE  10 mg Oral Q breakfast    Assessment: Pt is on chronic warfarin therapy for Afib. Admitted for supratherapeutic INR (>10), hemoptysis, and hemoccult-positive stool. Discharged on Dec 14th on warfarin 2.5mg  daily, but patient had been taking 5mg  daily. Also started on amiodarone and digoxin during December admission. Patient received vitamin K 10mg  IV on Jan 2 & warfarin was held. INR now subtherapeutic and hemoglobin stabilized.  Goal of Therapy:  INR 2-3   Plan:  Discharge today on warfarin 2.5mg  PO daily. Patient has appointment to follow-up INR on Monday.   Delphia Grates, PharmD Candidate 11/04/2011,9:44 AM

## 2011-11-04 NOTE — Progress Notes (Signed)
Internal Medicine Teaching Service Attending Note Date: 11/04/2011  Patient name: Kim [name]  Medical record number: [id number]  Date of birth: 10-Dec-1928    This patient has been seen and discussed with the house staff. Please see their note for complete details. I concur with their findings with the following additions/corrections:  Pt's HgB is now stable. No further bleeding. INR remains corrected. We discussed Rivaroxiban with her and son since she has had other episodes of increased INR and it is challenging for her to get out of house to clinic for INR checks. Pt wanted to get Dr Saralyn Pilar opinion and he will do so today.   Pt stable for D/C today either on warfarin 2.5 mg QD or rivaroxiban.   Beula Joyner 11/04/2011, 12:25 PM

## 2011-11-04 NOTE — Progress Notes (Signed)
Physical Therapy Note: order received, chart reviewed, spoke with pt who reports she is at her baseline mobility and does not want or need P.T. Pt's greatest concern is having an aide at home to assist when her family is not there. Pt states ALF is not an option and she will just figure it out. Pt requires assist for bathing, dressing, and getting up the stairs into her home. Will sign off per pt request.  Toney Sang, PT [phone]

## 2011-11-04 NOTE — Telephone Encounter (Signed)
Call from pt said that the new medication that was ordered by Dr. Alexandria Lodge.  Pt said that the new medication will be $300.00 and that she was unable to afford this.  Pt wanted to know what she should do.  Dr. Anselm Jungling was contacted and pt is to take her Coumadin 2.5 mg tablets on Saturday and Sunday and to come to the Clinics on Monday to see Dr. Alexandria Lodge.  Pt said that she is scheduled for an appointment with Dr. Algie Coffer on Tuesday and would just go to see him.  Pt was encouraged to come to the Clinics on Monday to see Dr. Alexandria Lodge.  Pt also said that she takes her Coumadin in the afternoon.  Pt will come in on Monday to see Dr. Alexandria Lodge as advised.  Pt would not give me a time said that she will have to get transportation here for the appointment.  Voiced understanding of the medication plan.   Angelina Ok, RN 11/04/2011 5:25 PM.

## 2011-11-04 NOTE — Discharge Summary (Signed)
Internal Medicine Teaching [facility] Discharge Note  Name: Kim [name] MRN: [id number] DOB: 27-Sep-1929 78 y.o.  Date of Admission: 11/02/2011  1:18 PM Date of Discharge: 11/04/2011 Attending Physician: Blanch Media  Discharge Diagnosis: Principal Problem:  *Supratherapeutic INR Active Problems:  Chronic diastolic heart failure  HEMOCCULT POSITIVE STOOL  Atrial fibrillation with RVR  Hemoptysis  HYPOKALEMIA  HYPERTENSION   Discharge Medications: Current Discharge Medication List    START taking these medications   Details  docusate sodium 100 MG CAPS Take 100 mg by mouth 2 (two) times daily. Qty: 30 capsule, Refills: 0    metoprolol (TOPROL-XL) 200 MG 24 hr tablet Take 1 tablet (200 mg total) by mouth daily. Qty: 30 tablet, Refills: 0    Rivaroxaban (XARELTO) 15 MG TABS tablet Take 1 tablet (15 mg total) by mouth daily. Qty: 30 tablet, Refills: 0      CONTINUE these medications which have NOT CHANGED   Details  amiodarone (PACERONE) 200 MG tablet Take 200 mg by mouth 2 (two) times daily.      cloNIDine (CATAPRES) 0.2 MG tablet Take 0.2 mg by mouth 2 (two) times daily.      colchicine 0.6 MG tablet Take 0.3 mg by mouth daily.      digoxin (LANOXIN) 0.125 MG tablet Take 125 mcg by mouth daily.      diltiazem (DILACOR XR) 240 MG 24 hr capsule Take 240 mg by mouth daily.      furosemide (LASIX) 40 MG tablet Take 40 mg by mouth 2 (two) times daily.      HYDROcodone-acetaminophen (NORCO) 5-325 MG per tablet Take 1-2 tablets by mouth every 6 (six) hours as needed. For pain. Patient stopped taking because it made her sick    latanoprost (XALATAN) 0.005 % ophthalmic solution Place 1 drop into both eyes at bedtime. Use as directed    potassium chloride SA (K-DUR,KLOR-CON) 20 MEQ tablet Take 40 mEq by mouth 2 (two) times daily.      promethazine (PHENERGAN) 12.5 MG tablet Take 12.5 mg by mouth every 6 (six) hours as needed. For nausea       STOP taking  these medications     metoprolol (LOPRESSOR) 50 MG tablet      predniSONE (DELTASONE) 20 MG tablet      warfarin (COUMADIN) 5 MG tablet         Disposition and follow-up:   [name] was discharged from [facility] in Stable condition.    Follow-up Appointments: Follow-up Information    Make an appointment with PRIBULA,CHRISTOPHER.      Follow up with [name], MD on 11/09/2011. (your appointment at 3:30. It is very important that you attend this appointment)    Contact information:   [address] [place] [address] [phone]         At her followup appointments the following item should be addressed. At her appointment in the toe medicine outpatient clinic please check the following items: #1 please check CBC to ensure anemia is resolving #2 please assess for side effects of rivaroxiban which was initiated at discharge #3 please check TSH in 3 weeks as amiodarone was begun 3 weeks prior to today's date. This would allow followup of her TSH 6 weeks after initiation of amiodarone.  At her appointment with Dr. Algie Coffer the following item should be addressed: #1 please evaluate antihypertensive regimen #2 please evaluate atrial fibrillation treatment regimen # 3 check CBC to ensure resolution  of anemia.  Discharge Orders    Future Orders Please Complete By Expires   Diet - low sodium heart healthy      Increase activity slowly      Driving Restrictions      Comments:   Do not drive or operate machinery while you are taking your opoid pain medicine (norco)    Call MD for:  temperature >100.4      Call MD for:  persistant nausea and vomiting      Call MD for:  severe uncontrolled pain      Call MD for:  difficulty breathing, headache or visual disturbances      Call MD for:  persistant dizziness or light-headedness      Call MD for:  extreme fatigue      Increase activity slowly      Heart Failure patients  record your daily weight using the same scale at the same time of day      Avoid straining      STOP any activity that causes chest pain, shortness of breath, dizziness, sweating, or exessive weakness      Beta Blocker already ordered      Contraindication to ARB at discharge         Consultations:   None  Procedures Performed:  Dg Chest 2 View  11/02/2011  *RADIOLOGY REPORT*  Clinical Data: Hemoptysis.  Right-sided chest pain.  CHEST - 2 VIEW  Comparison: 10/10/2011  Findings: Mild cardiomegaly persists.  There is bronchial thickening.  There is venous hypertension with interstitial edema. Density appears more focal in the lower lobes and basilar pneumonia is not excluded.  No effusions.  No bony abnormalities.  IMPRESSION: Venous hypertension with interstitial edema consistent with congestive heart failure.  Additionally, markings are focally prominent in the lower lobes bilaterally and there could be coexistent basilar pneumonia.  Original Report Authenticated By: Thomasenia Sales, M.D.   Dg Chest Portable 1 View  10/10/2011  *RADIOLOGY REPORT*  Clinical Data: Chest pain  PORTABLE CHEST - 1 VIEW  Comparison: 03/16/2010  Findings: Stable asymmetric elevation right hemidiaphragm. The cardiopericardial silhouette is enlarged. The lungs are clear without focal infiltrate, edema, pneumothorax or pleural effusion. Telemetry leads overlie the chest.  IMPRESSION: Stable.  No acute findings.  Original Report Authenticated By: ERIC A. MANSELL, M.D.    Admission HPI:  Kim [name] is a 78 y.o.female with past medical history significant for rheumatoid arthritis, A. fib with RVR on warfarin and diastolic heart failure last echo in May 2011 with EF greater than 50%, who presents with hemoptysis for her days and hematuria x1 day.    Orli L Aplin states that on Sunday she noticed that she is spitting up blood. She is on chronic oxygen therapy secondary to her diastolic CHF and thought the  hemoptysis was secondary to having a dry nose from her oxygen therapy. She states the blood was a moderate amount of bright and dark blood mixed with mucus. She states that on the day of admission she noticed blood on the paper after wiping post urination. She states she was recently discharged from the hospital on December 14th and was placed on a full 5 mg tablet warfarin. She normally takes one half. Of note her discharge summary does state she should take 2.5 mg of warfarin. She has had no change in her diet or change in medications. She denies any blood in her stool. She denies any focal swelling or bruising. She states  she occasionally feels nauseated, though this is not worse when sitting up or standing. She states she feels generally weak, but has no chest pain chills, shortness of breath above baseline, or increased palpitations. She does have palpitations at baseline secondary to atrial fibrillation. She states she had a headache this morning. She specifically denies at its worst headache of her life. Denies any focal neurologic signs such as weakness, paresthesias, or numbness. She states that the pain is located in her for head behind her eyes and is exactly like her previous chronic headaches she has occasionally. Ms. Seaman came to the ED after she noticed blood on her tissue paper with urination. There she was found to have INR greater than 10, guaiac positive stool and bright red blood per rectum on DRE. She was given 10 mg of IV vitamin K and 2 units of fresh frozen plasma.  Endorses nausea but no vomiting. She endorses some mild bilateral lower quadrant pain approximately 2-3 days ago but is worse with turning over. Pain is now currently resolved.  Admission Physical Exam:  Blood pressure 154/90, pulse 111, temperature 98.2 F (36.8 C), temperature source Oral, resp. rate 18, height 5\' 4"  (1.626 m), weight 131 lb 2.8 oz (59.5 kg), SpO2 93.00%.  Gen: elderly female in no acute distress;  alert, appropriate and cooperative throughout examination. Head: Normocephalic, atraumatic. Eyes: PERRL, Arcus senilis, EOMI, No signs of anemia or jaundince.  Nose: Mucous membranes moist, no blood seen.  Throat: Oropharynx nonerythematous, no exudate appreciated. faint petechia in the posterior oropharynx. no blood seen.  Lungs: Normal respiratory effort. faint coarse inspiratory breath sounds at the bilateral lung bases.  Heart: irregularly irregular and tachycardic. S1 and S2 normal without murmur,or gallop  Abdomen: BS normoactive. Soft, nondistended, non-tender. No masses or organomegaly. Extremities: No pretibial edema. she has tophaceous deformities of the left olecranon process. She has a large deviation and hyperdense nodes of her bilateral MCP joints. Bilateral knees have slight effusions. These are cool to the touch and is nontender to palpation. the bilateral ankles are swollen without pitting edema.  Neurologic: A&O X3, CN II - XII are grossly intact. could move all 4 extremities with only limitation secondary to arthritis pain. Sensations intact to light touch. No focal neurologic deficit Skin: No visible rashes, scars.  Psych: mood and affect are normal.  Orthostatic vital signs:  Laying blood pressure 155/97 heart rate 112  Sitting blood pressure 165/86 heart rate 103  Standing blood pressure 160s over 80s heart rate in the 110s  Lab results:  Basic Metabolic Panel:   Basename  11/02/11 1359   NA  137   K  3.4*   CL  98   CO2  24   GLUCOSE  157*   BUN  22   CREATININE  1.37*   CALCIUM  8.6   MG  --   PHOS  --    CBC:   Basename  11/02/11 1359   WBC  6.1   NEUTROABS  3.2   HGB  11.0*   HCT  33.9*   MCV  90.9   PLT  277    Coagulation:   Basename  11/02/11 1359   LABPROT  >90.0*   INR  >10.00*    Urinalysis:  Results for EVGENIA, MERRIMAN (MRN [id number]) as of 11/02/2011 18:37   Ref. Range  11/02/2011 17:13   Color, Urine  Latest Range: YELLOW  AMBER (A)    APPearance  Latest Range: CLEAR  CLOUDY (  A)   Specific Gravity, Urine  Latest Range: 1.005-1.030  1.020   pH  Latest Range: 5.0-8.0  5.5   Glucose, UA  Latest Range: NEGATIVE mg/dL  NEGATIVE   Urine Glucose, Fasting  Latest Range: NEG mg/dL    Urine Glucose  Latest Range: NEG mg/dL    Bilirubin Urine  Latest Range: NEGATIVE  SMALL (A)   Ketones, ur  Latest Range: NEGATIVE mg/dL  15 (A)   Protein  Latest Range: NEGATIVE mg/dL  NEGATIVE   Urobilinogen, UA  Latest Range: 0.0-1.0 mg/dL  1.0   Nitrite  Latest Range: NEGATIVE  POSITIVE (A)   Leukocytes, UA  Latest Range: NEGATIVE  MODERATE (A)   Blood, UA  Latest Range: NEG    WBC, UA  Latest Range: <3 WBC/hpf  11-20   RBC / HPF  Latest Range: <3 RBC/hpf  21-50   Squamous Epithelial / LPF  Latest Range: RARE  FEW (A)   Bacteria, UA  Latest Range: RARE  FEW (A)   Casts  Latest Range: NEGATIVE  HYALINE CASTS (A)      Hospital Course by problem list: Principal Problem:  1) Supratherapeutic INR with hemoptysis, hemoccult-positive stool and anemia: Supratherapeutic INR is Likely multifactorial secondary to not being able to go to her followup appointment with Dr. Algie Coffer and addition of amiodarone approximately 4-6 weeks ago. Successfully reversed with vitamin K and 2 units of fresh frozen plasma. INR now subtherapeutic at 1.21. Patient remains asymptomatic from her anemia and hemoglobin has stabilized at approximately 10.6, she was down from a baseline of approximately 13. After Ms. Routt first evening of admission, her hemoptysis resolved. She had no further hematuria or blood per rectum. In consultation with Dr. Alexandria Lodge and Ms. Mauritz, the decision was made to change Ms. [place] anticoagulation therapy for warfarin to rivaroxiban as this would allow her to not require warfarin monitoring. She had much difficulty attending her appointments for INR monitoring and the changes in dose were confusing for her. She was discharged on rivaroxiban 15  mg by mouth with dinner.  2) atrial fibrillation with RVR -Ms. Panagopoulos tachycardia improved though was still not optimal at discharge.  Dig level was therapeutic. Given questionable compliance, metoprolol was changed from 4 times a day to metoprolol XL. The patient tolerated metoprolol XL 100 mg twice daily well, and she was advanced to metoprolol XL 200 mg once daily at discharge. Her home therapy of amiodarone, digoxin, and diltiazem were continued during her hospitalization. She has a followup with her cardiologist on January 9 for further management of her atrial fibrillation with rapid ventricular response. She will need repeat TSH in 3 weeks as an outpatient secondary to new start of amiodarone.  3) Chronic diastolic heart failure -Ms. Lorek displayed no evidence of symptomatic diastolic heart failure during her admission. Her furosemide was held in the setting of acute bleeding, as well as clonidine. She was restarted on these medications on the morning of discharge. Otherwise her medications were unchanged.  4) question UTI - the patient was asymptomatic, but given her other comorbidities she was treated with 2 days of ceftriaxone.  5) rheumatoid arthritis and gout - stable. Prednisone was started at previous admission and taper should have been completed by now. Prednisone was not continued after hospital day one. Her colchicine was continued and probenecid were continued at discharge  6) HYPOKALEMIA - hypokalemia rapidly corrected and she was maintained on home dose of potassium.  7) HYPERTENSION - during the transition to  metoprolol at all the patient's blood pressure was elevated. It is expected that her blood pressure will move downward as she is only just recently restarted on clonidine, metoprolol, and furosemide. Her diltiazem was continued unchanged. Started on metoprolol XL and continued diltiazem.   8) chronic renal insufficiency - Ms. Gierke remained at baseline creatinine  during this admission   Discharge Vitals:  BP 160/107  Pulse 124  Temp(Src) 98.2 F (36.8 C) (Oral)  Resp 20  Ht 5\' 4"  (1.626 m)  Wt 142 lb 6.7 oz (64.6 kg)  BMI 24.45 kg/m2  SpO2 96%  Discharge Labs:  Results for orders placed during the hospital encounter of 11/02/11 (from the past 24 hour(s))  CBC     Status: Abnormal   Collection Time   11/03/11  6:58 PM      Component Value Range   WBC 7.1  4.0 - 10.5 (K/uL)   RBC 3.57 (*) 3.87 - 5.11 (MIL/uL)   Hemoglobin 10.8 (*) 12.0 - 15.0 (g/dL)   HCT 40.9 (*) 81.1 - 46.0 (%)   MCV 90.2  78.0 - 100.0 (fL)   MCH 30.3  26.0 - 34.0 (pg)   MCHC 33.5  30.0 - 36.0 (g/dL)   RDW 91.4 (*) 78.2 - 15.5 (%)   Platelets 306  150 - 400 (K/uL)  CBC     Status: Abnormal   Collection Time   11/03/11 11:40 PM      Component Value Range   WBC 7.3  4.0 - 10.5 (K/uL)   RBC 3.58 (*) 3.87 - 5.11 (MIL/uL)   Hemoglobin 10.7 (*) 12.0 - 15.0 (g/dL)   HCT 95.6 (*) 21.3 - 46.0 (%)   MCV 89.9  78.0 - 100.0 (fL)   MCH 29.9  26.0 - 34.0 (pg)   MCHC 33.2  30.0 - 36.0 (g/dL)   RDW 08.6 (*) 57.8 - 15.5 (%)   Platelets 287  150 - 400 (K/uL)  PROTIME-INR     Status: Abnormal   Collection Time   11/04/11  6:20 AM      Component Value Range   Prothrombin Time 15.6 (*) 11.6 - 15.2 (seconds)   INR 1.21  0.00 - 1.49   BASIC METABOLIC PANEL     Status: Abnormal   Collection Time   11/04/11  6:20 AM      Component Value Range   Sodium 138  135 - 145 (mEq/L)   Potassium 4.8  3.5 - 5.1 (mEq/L)   Chloride 102  96 - 112 (mEq/L)   CO2 20  19 - 32 (mEq/L)   Glucose, Bld 96  70 - 99 (mg/dL)   BUN 27 (*) 6 - 23 (mg/dL)   Creatinine, Ser 4.69 (*) 0.50 - 1.10 (mg/dL)   Calcium 9.5  8.4 - 62.9 (mg/dL)   GFR calc non Af Amer 28 (*) >90 (mL/min)   GFR calc Af Amer 33 (*) >90 (mL/min)  CBC     Status: Abnormal   Collection Time   11/04/11  6:20 AM      Component Value Range   WBC 10.3  4.0 - 10.5 (K/uL)   RBC 3.62 (*) 3.87 - 5.11 (MIL/uL)   Hemoglobin 10.7 (*) 12.0 - 15.0  (g/dL)   HCT 52.8 (*) 41.3 - 46.0 (%)   MCV 91.2  78.0 - 100.0 (fL)   MCH 29.6  26.0 - 34.0 (pg)   MCHC 32.4  30.0 - 36.0 (g/dL)   RDW 24.4 (*) 01.0 - 15.5 (%)  Platelets 325  150 - 400 (K/uL)  DIGOXIN LEVEL     Status: Normal   Collection Time   11/04/11  6:20 AM      Component Value Range   Digoxin Level 0.8  0.8 - 2.0 (ng/mL)  VITAMIN B12     Status: Abnormal   Collection Time   11/04/11  6:20 AM      Component Value Range   Vitamin B-12 960 (*) 211 - 911 (pg/mL)  FOLATE     Status: Normal   Collection Time   11/04/11  6:20 AM      Component Value Range   Folate 12.5    IRON AND TIBC     Status: Normal   Collection Time   11/04/11  6:20 AM      Component Value Range   Iron 74  42 - 135 (ug/dL)   TIBC 782  956 - 213 (ug/dL)   Saturation Ratios 26  20 - 55 (%)   UIBC 215  125 - 400 (ug/dL)  FERRITIN     Status: Abnormal   Collection Time   11/04/11  6:20 AM      Component Value Range   Ferritin 301 (*) 10 - 291 (ng/mL)  RETICULOCYTES     Status: Abnormal   Collection Time   11/04/11  6:20 AM      Component Value Range   Retic Ct Pct 5.6 (*) 0.4 - 3.1 (%)   RBC. 3.58 (*) 3.87 - 5.11 (MIL/uL)   Retic Count, Manual 200.5 (*) 19.0 - 186.0 (K/uL)    Signed: Eutimio Gharibian 11/04/2011, 1:00 PM

## 2011-11-04 NOTE — Telephone Encounter (Signed)
I spoke by phone with Dr. Alexandria Lodge who concurs with plan.

## 2011-11-04 NOTE — Progress Notes (Signed)
Subjective: No events overnight. Denies hemoptysis, hematuria or blood per rectum. States breathing is at baseline however was not given oxygen last night. Denies headache, nausea, vomiting, chest pain, shortness of breath, fever, chills, abdominal pain, swelling or other complaint. To go home today. Is followed in the Coumadin clinic by Dr. Alexandria Lodge  Objective: Vital signs in last 24 hours: Filed Vitals:   11/03/11 1800 11/03/11 2102 11/04/11 0432 11/04/11 0447  BP: 162/86 147/87 158/107 162/100  Pulse: 90 81 115 110  Temp: 98.6 F (37 C) 98.4 F (36.9 C) 98 F (36.7 C)   TempSrc: Oral Oral Oral   Resp: 20 19 20    Height:      Weight:  142 lb 6.7 oz (64.6 kg)    SpO2: 97% 95% 95%    Weight change: 11 lb 3.9 oz (5.1 kg)  Intake/Output Summary (Last 24 hours) at 11/04/11 0717 Last data filed at 11/04/11 0300  Gross per 24 hour  Intake    613 ml  Output      0 ml  Net    613 ml   Physical Exam: General: resting in bed Cardiac: Tachycardic and Irregularly irregular, no rubs, murmurs or gallops Pulm: clear to auscultation bilaterally, moving normal volumes of air Abd: soft, nontender, nondistended, BS present Extremities: No pretibial edema. she has tophaceous deformities of the left olecranon process. She has a large deviation and hyperdense nodes of her bilateral MCP joints. Bilateral knees have slight effusions. These are cool to the touch and is nontender to palpation. the bilateral ankles and wrists are swollen. No pitting edema.  Neuro: alert and oriented X3, cranial nerves II-XII grossly intact  Lab Results: Basic Metabolic Panel:  Lab 11/03/11 1610 11/02/11 1359  NA 134* 137  K 4.0 3.4*  CL 98 98  CO2 23 24  GLUCOSE 156* 157*  BUN 22 22  CREATININE 1.35* 1.37*  CALCIUM 8.7 8.6  MG -- --  PHOS -- --   CBC:  Lab 11/03/11 2340 11/03/11 1858 11/02/11 1359  WBC 7.3 7.1 --  NEUTROABS -- -- 3.2  HGB 10.7* 10.8* --  HCT 32.2* 32.2* --  MCV 89.9 90.2 --  PLT 287  306 --   Coagulation:  Lab 11/03/11 0238 11/02/11 2055 11/02/11 1359  LABPROT 17.2* 20.2* >90.0*  INR 1.38 1.69* >10.00*    Results for Kim [name], Kim [name] (MRN [id number]) as of 11/04/2011 08:42  Ref. Range 12/28/2010 11:14 12/28/2010 14:57 11/04/2011 06:20  Digitoxin Lvl Latest Range: 0.8-2.0 ng/mL  2.0   Digoxin Level Latest Range: 0.8-2.0 ng/mL 2.0  0.8    Urinalysis:   Ref. Range  11/02/2011 17:13   Color, Urine  Latest Range: YELLOW   AMBER (A)   APPearance  Latest Range: CLEAR   CLOUDY (A)   Specific Gravity, Urine  Latest Range: 1.005-1.030   1.020   pH  Latest Range: 5.0-8.0   5.5   Glucose, UA  Latest Range: NEGATIVE mg/dL  NEGATIVE   Urine Glucose, Fasting  Latest Range: NEG mg/dL     Urine Glucose  Latest Range: NEG mg/dL     Bilirubin Urine  Latest Range: NEGATIVE   SMALL (A)   Ketones, ur  Latest Range: NEGATIVE mg/dL  15 (A)   Protein  Latest Range: NEGATIVE mg/dL  NEGATIVE   Urobilinogen, UA  Latest Range: 0.0-1.0 mg/dL  1.0   Nitrite  Latest Range: NEGATIVE   POSITIVE (A)   Leukocytes, UA  Latest Range: NEGATIVE   MODERATE (  A)   Blood, UA  Latest Range: NEG      WBC, UA  Latest Range: <3 WBC/hpf  11-20   RBC / HPF  Latest Range: <3 RBC/hpf  21-50   Squamous Epithelial / LPF  Latest Range: RARE   FEW (A)   Bacteria, UA  Latest Range: RARE   FEW (A)   Casts  Latest Range: NEGATIVE   HYALINE CASTS (A)     Micro Results: No results found for this or any previous visit (from the past 240 hour(s)). Studies/Results: Dg Chest 2 View  11/02/2011  *RADIOLOGY REPORT*  Clinical Data: Hemoptysis.  Right-sided chest pain.  CHEST - 2 VIEW  Comparison: 10/10/2011  Findings: Mild cardiomegaly persists.  There is bronchial thickening.  There is venous hypertension with interstitial edema. Density appears more focal in the lower lobes and basilar pneumonia is not excluded.  No effusions.  No bony abnormalities.  IMPRESSION: Venous hypertension with interstitial edema consistent with  congestive heart failure.  Additionally, markings are focally prominent in the lower lobes bilaterally and there could be coexistent basilar pneumonia.  Original Report Authenticated By: Thomasenia Sales, M.D.   Medications: I have reviewed the patient's current medications. Scheduled Meds:    . amiodarone  200 mg Oral BID  . cefTRIAXone (ROCEPHIN)  IV  1 g Intravenous Q24H  . cloNIDine  0.3 mg Oral BID  . digoxin  125 mcg Oral Daily  . diltiazem  240 mg Oral Daily  . docusate sodium  100 mg Oral BID  . latanoprost  1 drop Both Eyes QHS  . metoprolol succinate  100 mg Oral BID  . pantoprazole (PROTONIX) IV  40 mg Intravenous Q12H  . potassium chloride SA  40 mEq Oral BID  . sodium chloride  3 mL Intravenous Q12H  . DISCONTD: levofloxacin (LEVAQUIN) IV  750 mg Intravenous Q48H  . DISCONTD: metoprolol  50 mg Oral QID  . DISCONTD: predniSONE  10 mg Oral Q breakfast  . DISCONTD: predniSONE  20 mg Oral Q breakfast   Continuous Infusions:  PRN Meds:.sodium chloride, acetaminophen, acetaminophen, HYDROcodone-acetaminophen, ondansetron (ZOFRAN) IV, ondansetron, promethazine, sodium chloride Assessment/Plan: Principal Problem: 1) Supratherapeutic INR with hemoptysis, hemoccult-positive stool and anemia: Likely multifactorial secondary to not being able to go to her followup appointment with Dr. Algie Coffer. Also addition of amiodarone approximately 4-6 weeks ago could be affecting INR levels. Successfully reversed with vitamin K and 2 units of fresh frozen plasma. INR now subtherapeutic at 1.21.  Patient remains asymptomatic from her anemia and hemoglobin has stabilized now at approximately 10.6. Now that acute bleeding resolved we will restart warfarin. Plan is to restart at 2.5 mg by mouth daily she will be seen in the warfarin clinic on Monday for check of INR. -- Initiate warfarin per pharmacy -- Will obtain records from Dr. Algie Coffer. -- Will discharge on once daily PPI  2) atrial fibrillation  with RVR -tachycardia improved though still not optimal. Otherwise stable. Dig level therapeutic. Given questionable compliance, we will change metoprolol from 4 times a day to metoprolol XL. The patient tolerated metoprolol XL 100 mg twice daily well. As such we will advance to metoprolol XL 200 mg once daily today. Will continue home therapy of amiodarone, digoxin, and diltiazem.  -- Amiodarone 200 mg twice a day  -- Digoxin 125 MCG daily  -- Metoprolol XL 200 mg twice once daily -- Diltiazem 240 mg daily  -- Warfarin held per problem #1 -- Will need repeat TSH in  3 weeks as an outpatient secondary to new start of amiodarone  3) Chronic diastolic heart failure - stable. -- Will add back furosemide today -- Continue metoprolol  4) question UTI - the patient is asymptomatic at this time given her other comorbidities she is currently being treated with ceftriaxone. Will discharge on Ceftin. Antibiotic day one. -- Ceftriaxone day 2  5) rheumatoid arthritis and gout - stable. Prednisone was started at previous admission and taper should have been completed by now.  -- Will obtain records from Dr. Dareen Piano who completed rheumatology workup in February of 2011. -- Continue colchicine -- Physical therapy consulted for possible home health PT and home health aide. Awaiting recommendations.  6) HYPOKALEMIA - resolved -- Daily potassium supplementation  7) HYPERTENSION - elevated above goal today. Started on metoprolol XL and continued diltiazem. -- will add back clonidine and furosemide today -- given good response to metoprolol XL 100 mg divided BID, will advance to metoprolol XL 200 mg daily -- Will continue 2 monitor blood pressure further and hold antihypertensives in the setting of hypertension.  8) chronic renal insufficiency - at baseline creatinine   9) DVT prophylaxis  - SCDs   10) Disposition - likely home today with close followup for INR monitoring.    LOS: 2 days   Kim [name],  Kim [name] 11/04/2011, 7:17 AM

## 2011-11-04 NOTE — Plan of Care (Signed)
Problem: Consults Goal: General Medical Patient Education See Patient Education Module for specific education.  Education provided to pt. And family about sign and symptoms of CHF and what to look for

## 2011-11-07 ENCOUNTER — Ambulatory Visit (INDEPENDENT_AMBULATORY_CARE_PROVIDER_SITE_OTHER): Payer: Medicare Other

## 2011-11-07 ENCOUNTER — Telehealth: Payer: Self-pay | Admitting: *Deleted

## 2011-11-07 DIAGNOSIS — Z7901 Long term (current) use of anticoagulants: Secondary | ICD-10-CM

## 2011-11-07 DIAGNOSIS — I6789 Other cerebrovascular disease: Secondary | ICD-10-CM

## 2011-11-07 DIAGNOSIS — I4891 Unspecified atrial fibrillation: Secondary | ICD-10-CM

## 2011-11-07 NOTE — Telephone Encounter (Signed)
Prior authorization was approved for Xarelto 15 mg tablets # 30 starting 11/07/2011 thru 11/06/2012.  [facility] was called and notified of.  Angelina Ok, RN 11/07/2011 9:52 AM

## 2011-11-18 ENCOUNTER — Encounter: Payer: Medicare Other | Admitting: Internal Medicine

## 2011-11-28 ENCOUNTER — Ambulatory Visit (INDEPENDENT_AMBULATORY_CARE_PROVIDER_SITE_OTHER): Payer: Medicare Other | Admitting: Pharmacist

## 2011-11-28 DIAGNOSIS — Z7901 Long term (current) use of anticoagulants: Secondary | ICD-10-CM

## 2011-11-28 DIAGNOSIS — I4891 Unspecified atrial fibrillation: Secondary | ICD-10-CM

## 2011-11-28 DIAGNOSIS — I6789 Other cerebrovascular disease: Secondary | ICD-10-CM

## 2011-11-29 ENCOUNTER — Other Ambulatory Visit: Payer: Self-pay | Admitting: *Deleted

## 2011-11-29 LAB — CBC
MCH: 31.1 pg (ref 26.0–34.0)
MCHC: 32.2 g/dL (ref 30.0–36.0)
MCV: 96.3 fL (ref 78.0–100.0)
Platelets: 258 10*3/uL (ref 150–400)
RBC: 4.09 MIL/uL (ref 3.87–5.11)
RDW: 18.4 % — ABNORMAL HIGH (ref 11.5–15.5)

## 2011-11-29 NOTE — Telephone Encounter (Signed)
Pt caled said that she takes 1 whole tablet of the Colcrys for her gout and not the 1/2 tablet.  Said that her hands are swelling now.  Angelina Ok, RN 11/29/2011 2:54 PM

## 2011-11-30 MED ORDER — RIVAROXABAN 15 MG PO TABS: 15.0000 mg | ORAL_TABLET | Freq: Every day | ORAL | Status: DC

## 2011-11-30 MED ORDER — COLCHICINE 0.6 MG PO TABS: 0.3000 mg | ORAL_TABLET | Freq: Every day | ORAL | Status: DC

## 2011-11-30 MED ORDER — METOPROLOL SUCCINATE ER 200 MG PO TB24: 200.0000 mg | ORAL_TABLET | Freq: Every day | ORAL | Status: DC

## 2011-12-16 ENCOUNTER — Ambulatory Visit (INDEPENDENT_AMBULATORY_CARE_PROVIDER_SITE_OTHER): Payer: Medicare Other | Admitting: Internal Medicine

## 2011-12-16 ENCOUNTER — Encounter: Payer: Self-pay | Admitting: Internal Medicine

## 2011-12-16 DIAGNOSIS — I10 Essential (primary) hypertension: Secondary | ICD-10-CM

## 2011-12-16 DIAGNOSIS — N259 Disorder resulting from impaired renal tubular function, unspecified: Secondary | ICD-10-CM

## 2011-12-16 DIAGNOSIS — N289 Disorder of kidney and ureter, unspecified: Secondary | ICD-10-CM

## 2011-12-16 DIAGNOSIS — M1A9XX1 Chronic gout, unspecified, with tophus (tophi): Secondary | ICD-10-CM

## 2011-12-16 DIAGNOSIS — I5032 Chronic diastolic (congestive) heart failure: Secondary | ICD-10-CM

## 2011-12-16 DIAGNOSIS — R946 Abnormal results of thyroid function studies: Secondary | ICD-10-CM

## 2011-12-16 DIAGNOSIS — E079 Disorder of thyroid, unspecified: Secondary | ICD-10-CM

## 2011-12-16 NOTE — Patient Instructions (Addendum)
1.  Stop in the lab up on 1st floor to have your blood drawn.  2.  Increase your Lasix (furosemide) 40 mg tablets take 2 tablets twice daily until Monday afternoon.  3. Come back to clinic on Monday to have your blood drawn.  I will call you with the follow up of the results.  4.  Continue all of your other medications at this time.

## 2011-12-16 NOTE — Progress Notes (Signed)
Subjective:   Patient ID: Christalynn Boise Pree female   DOB: [birth year] 78 y.o.   MRN: [id number]  HPI: Ms.Harjot L Cutting is a 78 y.o. woman who presents to clinic today complaining of worsening joint pain for the last week.  She states that her tophi had swollen on her hands and elbow for the last week.  They have also been red and warm as well during that time.  She denies fevers, chills, nausea, or vomiting.    She states that her breathing has been doing well over the last few weeks.  She is using her home O2 at bedtime but otherwise does not wear it during the day.   She also states that when she left the hospital last in January that she had a fall at home.  She states that "her insurance came out" and put in a ramp for her to use.  She is asking today for a lightweight wheelchair because the one she is using currently is too heavy to move around much for her.    Past Medical History  Diagnosis Date  . HTN (hypertension)   . AF (atrial fibrillation)     On Xarelto as of 11/2011  . Diastolic heart failure     Echo 5/11: There was moderate concentric hypertrophy. Systolic function was vigorous. The best esimated ejection fraction was in the range of 65% to 70%. Wall motion was normal  . Gout   . RA (rheumatoid arthritis)   . Chronic renal insufficiency     Baseline approx: 1.7  . Glaucoma     No recent eye visits noted in EMR.   . Pulmonary artery hypertension     Per 5/11 echo, PA peak pressure: 69mm Hg  . Stroke   . Coronary artery disease    Current Outpatient Prescriptions  Medication Sig Dispense Refill  . amiodarone (PACERONE) 200 MG tablet Take 200 mg by mouth 2 (two) times daily.        . cloNIDine (CATAPRES) 0.2 MG tablet Take 0.2 mg by mouth 2 (two) times daily.        . colchicine 0.6 MG tablet Take 0.5 tablets (0.3 mg total) by mouth daily.  30 tablet  6  . digoxin (LANOXIN) 0.125 MG tablet Take 125 mcg by mouth daily.        Marland Kitchen diltiazem (DILACOR XR) 240 MG 24 hr  capsule Take 240 mg by mouth daily.        . furosemide (LASIX) 40 MG tablet Take 40 mg by mouth 2 (two) times daily.        Marland Kitchen HYDROcodone-acetaminophen (NORCO) 5-325 MG per tablet Take 1-2 tablets by mouth every 6 (six) hours as needed. For pain. Patient stopped taking because it made her sick      . latanoprost (XALATAN) 0.005 % ophthalmic solution Place 1 drop into both eyes at bedtime. Use as directed      . metoprolol (TOPROL-XL) 200 MG 24 hr tablet Take 1 tablet (200 mg total) by mouth daily.  30 tablet  0  . potassium chloride SA (K-DUR,KLOR-CON) 20 MEQ tablet Take 40 mEq by mouth 2 (two) times daily.        . promethazine (PHENERGAN) 12.5 MG tablet Take 12.5 mg by mouth every 6 (six) hours as needed. For nausea       . Rivaroxaban (XARELTO) 15 MG TABS tablet Take 1 tablet (15 mg total) by mouth daily.  30 tablet  0  . DISCONTD: furosemide (  LASIX) 40 MG tablet Take 1 tablet (40 mg total) by mouth 2 (two) times daily.  180 tablet  4  . DISCONTD: latanoprost (XALATAN) 0.005 % ophthalmic solution Place 1 drop into both eyes daily. Use as directed  2.5 mL  12   Family History  Problem Relation Age of Onset  . Diabetes Mother   . Heart disease Father 78    Currently deceased  . Stroke Sister 35  . Stroke Mother 15   History   Social History  . Marital Status: Widowed    Spouse Name: N/A    Number of Children: N/A  . Years of Education: N/A   Occupational History  . 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    Social History Main Topics  . Smoking status: Former Smoker    Types: Cigarettes    Quit date: 10/11/1971  . Smokeless tobacco: None  . Alcohol Use: No  . Drug Use: No  . Sexually Active: No   Other Topics Concern  . None   Social History Narrative   Financial assistance approved for 25% discount after Medicare pays for [facility] only, not eligible for [facility] card.Has [place] health care, medicare.  Currently lives with her daughter and her 2 granddaughters. Has 3  children in town which she is close to and is able to rely on if needed.  High school education. She normally lives alone and can ambulate with a walker. She normally walks herself to the bathroom and perform basic ADLs.   Review of Systems: Constitutional: Denies fever, chills, diaphoresis, appetite change and fatigue.  HEENT: Denies photophobia, eye pain, redness, hearing loss, ear pain, congestion, sore throat, rhinorrhea, sneezing, mouth sores, trouble swallowing, neck pain, neck stiffness and tinnitus.   Respiratory: Denies SOB, DOE, cough, chest tightness,  and wheezing.   Cardiovascular: Denies chest pain, palpitations and leg swelling.  Gastrointestinal: Denies nausea, vomiting, abdominal pain, diarrhea, constipation, blood in stool and abdominal distention.  Genitourinary: Denies dysuria, urgency, frequency, hematuria, flank pain and difficulty urinating.  Musculoskeletal: Positive for joint pain and  joint swelling.  Denies myalgias, back pain, arthralgias and gait problem.  Skin: Denies pallor, rash and wound.  Neurological: Denies dizziness, seizures, syncope, weakness, light-headedness, numbness and headaches.  Hematological: Denies adenopathy. Easy bruising, personal or family bleeding history  Psychiatric/Behavioral: Denies suicidal ideation, mood changes, confusion, nervousness, sleep disturbance and agitation  Objective:  Physical Exam: Filed Vitals:   12/16/11 1622  BP: 120/76  Pulse: 58  Temp: 96.3 F (35.7 C)  TempSrc: Oral  Height: 5\' 4"  (1.626 m)  Weight: 136 lb 1.6 oz (61.735 kg)   Constitutional: Vital signs reviewed.  Patient is a chronically ill appearing elderly female in no acute distress and cooperative with exam. Alert and oriented x3.  Head: Normocephalic and atraumatic Ear: TM normal bilaterally Mouth: no erythema or exudates, MMM Eyes: PERRL, EOMI, conjunctivae normal, No scleral icterus.  Neck: Supple, Trachea midline normal ROM, No JVD, mass,  thyromegaly, or carotid bruit present.  Cardiovascular: RRR, S1 normal, S2 normal, no MRG, pulses symmetric and intact bilaterally Pulmonary/Chest: CTAB, no wheezes, rales, or rhonchi Abdominal: Soft. Non-tender, non-distended, bowel sounds are normal, no masses, organomegaly, or guarding present.  GU: no CVA tenderness Musculoskeletal: multiple gouty tophi noted in the MCP joints and elbows.  There is mild erythema noted.  Bilateral 2+ pitting edema to the knee.  Hematology: no cervical, inginal, or axillary adenopathy.  Neurological: A&O x3, Strength  is diminished in the bilateral lower extremities symmetrically.  cranial nerve II-XII are grossly intact, no focal motor deficit, sensory intact to light touch bilaterally.  Skin: Warm, dry and intact. No rash, cyanosis, or clubbing.  Psychiatric: Normal mood and flat affect. speech and behavior is normal. Judgment and thought content normal. Cognition and memory are normal.   Assessment & Plan:

## 2011-12-17 LAB — BASIC METABOLIC PANEL
BUN: 20 mg/dL (ref 6–23)
Calcium: 9.1 mg/dL (ref 8.4–10.5)
Chloride: 104 mEq/L (ref 96–112)
Creat: 1.65 mg/dL — ABNORMAL HIGH (ref 0.50–1.10)

## 2011-12-29 ENCOUNTER — Other Ambulatory Visit: Payer: Self-pay | Admitting: Internal Medicine

## 2011-12-30 ENCOUNTER — Other Ambulatory Visit: Payer: Self-pay | Admitting: Internal Medicine

## 2011-12-30 ENCOUNTER — Other Ambulatory Visit: Payer: Self-pay | Admitting: *Deleted

## 2011-12-30 MED ORDER — RIVAROXABAN 15 MG PO TABS: 15.0000 mg | ORAL_TABLET | Freq: Every day | ORAL | Status: DC

## 2011-12-30 MED ORDER — METOPROLOL SUCCINATE ER 200 MG PO TB24: 200.0000 mg | ORAL_TABLET | Freq: Every day | ORAL | Status: DC

## 2011-12-30 NOTE — Telephone Encounter (Signed)
Rx faxed in.

## 2011-12-31 ENCOUNTER — Other Ambulatory Visit: Payer: Self-pay

## 2011-12-31 ENCOUNTER — Encounter (HOSPITAL_COMMUNITY): Payer: Self-pay | Admitting: *Deleted

## 2011-12-31 ENCOUNTER — Inpatient Hospital Stay (HOSPITAL_COMMUNITY)
Admission: EM | Admit: 2011-12-31 | Discharge: 2012-01-04 | DRG: 378 | Disposition: A | Discharge status: Home / Self Care | Payer: Medicare Other | Attending: Internal Medicine | Admitting: Internal Medicine

## 2011-12-31 DIAGNOSIS — M109 Gout, unspecified: Secondary | ICD-10-CM | POA: Diagnosis present

## 2011-12-31 DIAGNOSIS — I509 Heart failure, unspecified: Secondary | ICD-10-CM | POA: Diagnosis present

## 2011-12-31 DIAGNOSIS — Z87891 Personal history of nicotine dependence: Secondary | ICD-10-CM

## 2011-12-31 DIAGNOSIS — M069 Rheumatoid arthritis, unspecified: Secondary | ICD-10-CM | POA: Diagnosis present

## 2011-12-31 DIAGNOSIS — Z8673 Personal history of transient ischemic attack (TIA), and cerebral infarction without residual deficits: Secondary | ICD-10-CM

## 2011-12-31 DIAGNOSIS — K219 Gastro-esophageal reflux disease without esophagitis: Secondary | ICD-10-CM | POA: Diagnosis present

## 2011-12-31 DIAGNOSIS — I110 Hypertensive heart disease with heart failure: Secondary | ICD-10-CM | POA: Diagnosis present

## 2011-12-31 DIAGNOSIS — I10 Essential (primary) hypertension: Secondary | ICD-10-CM | POA: Diagnosis present

## 2011-12-31 DIAGNOSIS — K922 Gastrointestinal hemorrhage, unspecified: Principal | ICD-10-CM | POA: Diagnosis present

## 2011-12-31 DIAGNOSIS — K5900 Constipation, unspecified: Secondary | ICD-10-CM | POA: Diagnosis present

## 2011-12-31 DIAGNOSIS — K649 Unspecified hemorrhoids: Secondary | ICD-10-CM | POA: Diagnosis present

## 2011-12-31 DIAGNOSIS — T68XXXA Hypothermia, initial encounter: Secondary | ICD-10-CM | POA: Diagnosis present

## 2011-12-31 DIAGNOSIS — I2789 Other specified pulmonary heart diseases: Secondary | ICD-10-CM | POA: Diagnosis present

## 2011-12-31 DIAGNOSIS — R000 Tachycardia, unspecified: Secondary | ICD-10-CM | POA: Diagnosis present

## 2011-12-31 DIAGNOSIS — K449 Diaphragmatic hernia without obstruction or gangrene: Secondary | ICD-10-CM | POA: Diagnosis present

## 2011-12-31 DIAGNOSIS — I4891 Unspecified atrial fibrillation: Secondary | ICD-10-CM | POA: Diagnosis present

## 2011-12-31 DIAGNOSIS — T462X5A Adverse effect of other antidysrhythmic drugs, initial encounter: Secondary | ICD-10-CM | POA: Diagnosis present

## 2011-12-31 DIAGNOSIS — I5032 Chronic diastolic (congestive) heart failure: Secondary | ICD-10-CM | POA: Diagnosis present

## 2011-12-31 LAB — DIFFERENTIAL
Basophils Absolute: 0.1 10*3/uL (ref 0.0–0.1)
Basophils Relative: 1 % (ref 0–1)
Monocytes Absolute: 0.7 10*3/uL (ref 0.1–1.0)
Neutro Abs: 3.4 10*3/uL (ref 1.7–7.7)

## 2011-12-31 LAB — COMPREHENSIVE METABOLIC PANEL
AST: 44 U/L — ABNORMAL HIGH (ref 0–37)
Albumin: 3.4 g/dL — ABNORMAL LOW (ref 3.5–5.2)
Calcium: 9.8 mg/dL (ref 8.4–10.5)
Chloride: 100 mEq/L (ref 96–112)
Creatinine, Ser: 1.6 mg/dL — ABNORMAL HIGH (ref 0.50–1.10)
Sodium: 140 mEq/L (ref 135–145)
Total Bilirubin: 1.2 mg/dL (ref 0.3–1.2)

## 2011-12-31 LAB — TYPE AND SCREEN: Antibody Screen: NEGATIVE

## 2011-12-31 LAB — PROTIME-INR: INR: 1.84 — ABNORMAL HIGH (ref 0.00–1.49)

## 2011-12-31 LAB — CBC
HCT: 39.4 % (ref 36.0–46.0)
MCH: 30.4 pg (ref 26.0–34.0)
MCHC: 34.3 g/dL (ref 30.0–36.0)
MCHC: 33.8 g/dL (ref 30.0–36.0)
Platelets: 207 10*3/uL (ref 150–400)
Platelets: 176 10*3/uL (ref 150–400)
RDW: 14.3 % (ref 11.5–15.5)

## 2011-12-31 LAB — APTT: aPTT: 35 seconds (ref 24–37)

## 2011-12-31 MED ORDER — METOPROLOL SUCCINATE ER 100 MG PO TB24
200.0000 mg | ORAL_TABLET | Freq: Every day | ORAL | Status: DC
  Administered 2012-01-01 – 2012-01-04 (×4): 200 mg via ORAL
  Filled 2011-12-31 (×4): qty 2

## 2011-12-31 MED ORDER — LATANOPROST 0.005 % OP SOLN
1.0000 [drp] | Freq: Every day | OPHTHALMIC | Status: DC
  Administered 2011-12-31 – 2012-01-03 (×4): 1 [drp] via OPHTHALMIC
  Filled 2011-12-31 (×3): qty 2.5

## 2011-12-31 MED ORDER — MORPHINE SULFATE 2 MG/ML IJ SOLN
2.0000 mg | Freq: Once | INTRAMUSCULAR | Status: AC
  Administered 2011-12-31: 2 mg via INTRAVENOUS
  Filled 2011-12-31: qty 1

## 2011-12-31 MED ORDER — SODIUM CHLORIDE 0.9 % IV SOLN
Freq: Once | INTRAVENOUS | Status: AC
  Administered 2011-12-31: 1000 mL via INTRAVENOUS

## 2011-12-31 MED ORDER — BIOTENE DRY MOUTH MT LIQD
15.0000 mL | Freq: Two times a day (BID) | OROMUCOSAL | Status: DC
  Administered 2011-12-31: 15 mL via OROMUCOSAL

## 2011-12-31 MED ORDER — ACETAMINOPHEN 650 MG RE SUPP: 650.0000 mg | Freq: Four times a day (QID) | RECTAL | Status: DC | PRN

## 2011-12-31 MED ORDER — DIGOXIN 125 MCG PO TABS
125.0000 ug | ORAL_TABLET | Freq: Every day | ORAL | Status: DC
  Administered 2011-12-31 – 2012-01-03 (×4): 125 ug via ORAL
  Filled 2011-12-31 (×5): qty 1

## 2011-12-31 MED ORDER — ONDANSETRON HCL 4 MG/2ML IJ SOLN
4.0000 mg | Freq: Four times a day (QID) | INTRAMUSCULAR | Status: DC | PRN
  Administered 2011-12-31 – 2012-01-02 (×2): 4 mg via INTRAVENOUS
  Filled 2011-12-31 (×2): qty 2

## 2011-12-31 MED ORDER — SODIUM CHLORIDE 0.9 % IV SOLN
INTRAVENOUS | Status: DC
  Administered 2011-12-31 (×2): via INTRAVENOUS

## 2011-12-31 MED ORDER — ONDANSETRON HCL 4 MG PO TABS
4.0000 mg | ORAL_TABLET | Freq: Four times a day (QID) | ORAL | Status: DC | PRN
  Administered 2012-01-01 (×2): 4 mg via ORAL
  Filled 2011-12-31 (×2): qty 1

## 2011-12-31 MED ORDER — MORPHINE SULFATE 2 MG/ML IJ SOLN
1.0000 mg | INTRAMUSCULAR | Status: DC | PRN
  Administered 2011-12-31 – 2012-01-01 (×2): 1 mg via INTRAVENOUS
  Filled 2011-12-31 (×2): qty 1

## 2011-12-31 MED ORDER — ACETAMINOPHEN 325 MG PO TABS
650.0000 mg | ORAL_TABLET | Freq: Four times a day (QID) | ORAL | Status: DC | PRN
  Administered 2012-01-02 – 2012-01-04 (×6): 650 mg via ORAL
  Filled 2011-12-31 (×6): qty 2

## 2011-12-31 MED ORDER — AMIODARONE HCL 200 MG PO TABS
200.0000 mg | ORAL_TABLET | Freq: Two times a day (BID) | ORAL | Status: DC
  Administered 2011-12-31 – 2012-01-01 (×2): 200 mg via ORAL
  Filled 2011-12-31 (×3): qty 1

## 2011-12-31 MED ORDER — CHLORHEXIDINE GLUCONATE 0.12 % MT SOLN
15.0000 mL | Freq: Two times a day (BID) | OROMUCOSAL | Status: DC
  Administered 2011-12-31 – 2012-01-01 (×2): 15 mL via OROMUCOSAL
  Filled 2011-12-31 (×4): qty 15

## 2011-12-31 MED ORDER — DILTIAZEM HCL ER 240 MG PO CP24
240.0000 mg | ORAL_CAPSULE | Freq: Every day | ORAL | Status: DC
  Administered 2011-12-31 – 2012-01-02 (×3): 240 mg via ORAL
  Filled 2011-12-31 (×4): qty 1

## 2011-12-31 MED ORDER — CLONIDINE HCL 0.2 MG PO TABS
0.2000 mg | ORAL_TABLET | Freq: Two times a day (BID) | ORAL | Status: DC
  Administered 2011-12-31 – 2012-01-04 (×8): 0.2 mg via ORAL
  Filled 2011-12-31 (×9): qty 1

## 2011-12-31 MED ORDER — PANTOPRAZOLE SODIUM 40 MG PO TBEC
40.0000 mg | DELAYED_RELEASE_TABLET | Freq: Every day | ORAL | Status: DC
  Administered 2011-12-31 – 2012-01-03 (×4): 40 mg via ORAL
  Filled 2011-12-31 (×4): qty 1

## 2011-12-31 NOTE — Plan of Care (Signed)
Problem: Phase I Progression Outcomes Goal: Initial discharge plan identified Outcome: Completed/Met Date Met:  12/31/11 Pt to return home when medically cleared

## 2011-12-31 NOTE — ED Notes (Signed)
Awaiting bed to be assigned .

## 2011-12-31 NOTE — ED Notes (Signed)
Pt recently started on xerelto.

## 2011-12-31 NOTE — ED Notes (Signed)
MD at bedside.md with internal medicine at bedside.

## 2011-12-31 NOTE — ED Provider Notes (Signed)
History     CSN: [id number]  Arrival date & time 12/31/11  1311   First MD Initiated Contact with Patient 12/31/11 1352      Chief Complaint  Patient presents with  . Rectal Bleeding    (Consider location/radiation/quality/duration/timing/severity/associated sxs/prior treatment) Patient is a 78 y.o. female presenting with hematochezia. The history is provided by the patient.  Rectal Bleeding    patient here complaining of rectal bleeding which started today when she was moving her bowels. Describes abdominal cramping, denies any vomiting of blood. No fever noted. Nothing makes her symptoms better or worse. Blood is described as being bright red and with some clots noted in the toilet. No history of same. Nothing makes her symptoms better. No medications taken for this prior to arrival  Past Medical History  Diagnosis Date  . HTN (hypertension)   . AF (atrial fibrillation)     On Xarelto as of 11/2011  . Diastolic heart failure     Echo 5/11: There was moderate concentric hypertrophy. Systolic function was vigorous. The best esimated ejection fraction was in the range of 65% to 70%. Wall motion was normal  . Gout   . RA (rheumatoid arthritis)   . Chronic renal insufficiency     Baseline approx: 1.7  . Glaucoma     No recent eye visits noted in EMR.   . Pulmonary artery hypertension     Per 5/11 echo, PA peak pressure: 69mm Hg  . Stroke   . Coronary artery disease     Past Surgical History  Procedure Date  . Cardiac catheterization 4/02     Minimal luminal irregularities without significant focal stenosis.  Marland Kitchen Extracapsular cataract extraction with intraocular lens 11/08    Right eye, Dr. Mitzi Davenport    Family History  Problem Relation Age of Onset  . Diabetes Mother   . Heart disease Father 59    Currently deceased  . Stroke Sister 59  . Stroke Mother 82    History  Substance Use Topics  . Smoking status: Former Smoker    Types: Cigarettes    Quit date:  10/11/1971  . Smokeless tobacco: Not on file  . Alcohol Use: No    OB History    Grav Para Term Preterm Abortions TAB SAB Ect Mult Living                  Review of Systems  Gastrointestinal: Positive for hematochezia.  All other systems reviewed and are negative.    Allergies  Review of patient's allergies indicates no known allergies.  Home Medications   Current Outpatient Rx  Name Route Sig Dispense Refill  . AMIODARONE HCL 200 MG PO TABS Oral Take 200 mg by mouth 2 (two) times daily.      Marland Kitchen CLONIDINE HCL 0.2 MG PO TABS  TAKE ONE TABLET TWICE DAILY 180 tablet 2  . COLCHICINE 0.6 MG PO TABS Oral Take 0.5 tablets (0.3 mg total) by mouth daily. 30 tablet 6  . DIGOXIN 0.125 MG PO TABS Oral Take 125 mcg by mouth daily.      Marland Kitchen DILTIAZEM HCL ER 240 MG PO CP24 Oral Take 240 mg by mouth daily.      . FUROSEMIDE 40 MG PO TABS Oral Take 40 mg by mouth 2 (two) times daily.      Marland Kitchen HYDROCODONE-ACETAMINOPHEN 5-325 MG PO TABS Oral Take 1-2 tablets by mouth every 6 (six) hours as needed. For pain. Patient stopped taking because it made  her sick    . LATANOPROST 0.005 % OP SOLN Both Eyes Place 1 drop into both eyes at bedtime. Use as directed    . METOPROLOL SUCCINATE ER 200 MG PO TB24 Oral Take 1 tablet (200 mg total) by mouth daily. 30 tablet 6  . POTASSIUM CHLORIDE CRYS ER 20 MEQ PO TBCR Oral Take 40 mEq by mouth 2 (two) times daily.      Marland Kitchen PROMETHAZINE HCL 12.5 MG PO TABS Oral Take 12.5 mg by mouth every 6 (six) hours as needed. For nausea     . RIVAROXABAN 15 MG PO TABS Oral Take 1 tablet (15 mg total) by mouth daily. 30 tablet 6    BP 138/84  Pulse 119  Temp(Src) 98.3 F (36.8 C) (Oral)  Resp 16  SpO2 97%  Physical Exam  Nursing note and vitals reviewed. Constitutional: She is oriented to person, place, and time. She appears well-developed and well-nourished.  Non-toxic appearance. No distress.  HENT:  Head: Normocephalic and atraumatic.  Eyes: Conjunctivae, EOM and lids are  normal. Pupils are equal, round, and reactive to light.  Neck: Normal range of motion. Neck supple. No tracheal deviation present. No mass present.  Cardiovascular: Regular rhythm and normal heart sounds.  Tachycardia present.  Exam reveals no gallop.   No murmur heard. Pulmonary/Chest: Effort normal and breath sounds normal. No stridor. No respiratory distress. She has no decreased breath sounds. She has no wheezes. She has no rhonchi. She has no rales.  Abdominal: Soft. Normal appearance and bowel sounds are normal. She exhibits no distension. There is no tenderness. There is no rebound and no CVA tenderness.  Genitourinary:       Gross rectal blood noted  Musculoskeletal: Normal range of motion. She exhibits no edema and no tenderness.  Neurological: She is alert and oriented to person, place, and time. She has normal strength. No cranial nerve deficit or sensory deficit. GCS eye subscore is 4. GCS verbal subscore is 5. GCS motor subscore is 6.  Skin: Skin is warm and dry. No abrasion and no rash noted.  Psychiatric: She has a normal mood and affect. Her speech is normal and behavior is normal.    ED Course  Procedures (including critical care time)   Labs Reviewed  CBC  DIFFERENTIAL  COMPREHENSIVE METABOLIC PANEL  PROTIME-INR  APTT  SAMPLE TO BLOOD BANK  TYPE AND SCREEN   No results found.   No diagnosis found.    MDM  Labs are pending at this time. However, due to her obvious lower gastrointestinal bleeding spoken with the internal medicine teaching service and they will come to admit the patient        Toy Baker, MD 12/31/11 1412

## 2011-12-31 NOTE — ED Notes (Addendum)
Pt reports she went to the restroom this am and had bright red blood stool x 2. Reports generalized abdominal pain. States she felt constipate this am before she went to the restroom. States abd pain was very severe before coming.

## 2011-12-31 NOTE — Progress Notes (Signed)
Kim [name] [id number] Unit to arrival: 12/31/2011 1600 Attending Provider: Ulyess Mort, MD  WUJ:[email or web address], MD, MD Consults/ Treatment Team:    Kim [name] is a 78 y.o. female patient admitted from ED awake, alert  & orientated  X 3, VSS - Blood pressure 175/96, pulse 102, temperature 97.2 F (36.2 C), temperature source Oral, resp. rate 20, height 5\' 4"  (1.626 m), weight 60 kg (132 lb 4.4 oz), SpO2 94.00%., no c/o shortness of breath, no c/o chest pain, no distress noted. Tele # [id number] placed and pt is currently running:A Fib HR 87   IV site WDL:  forearm left, condition patent and no redness with a transparent dsg that's clean dry and intact.  Allergies:  No Known Allergies   Past Medical History  Diagnosis Date  . HTN (hypertension)   . AF (atrial fibrillation)     On Xarelto as of 11/2011  . Diastolic heart failure     Echo 5/11: There was moderate concentric hypertrophy. Systolic function was vigorous. The best esimated ejection fraction was in the range of 65% to 70%. Wall motion was normal  . Gout   . RA (rheumatoid arthritis)   . Chronic renal insufficiency     Baseline approx: 1.7  . Glaucoma     No recent eye visits noted in EMR.   . Pulmonary artery hypertension     Per 5/11 echo, PA peak pressure: 69mm Hg  . Stroke   . Coronary artery disease     History:  obtained from the patient.  Pt orientation to unit, room and routine. Information packet given to patient/family and safety video watched.  Admission INP armband ID verified with patient/family, and in place. SR up x 2, fall risk assessment complete with Patient and family verbalizing understanding of risks associated with falls. Pt verbalizes an understanding of how to use the call bell and to call for help before getting out of bed.  Skin, clean-dry- intact without evidence of bruising, or skin tears.   No evidence of skin break down noted on exam.  Will cont to monitor and assist as  needed.  Kim [name] [facility], RN 12/31/2011 4:55 PM

## 2011-12-31 NOTE — H&P (Signed)
Hospital Admission Note Date: 12/31/2011  Patient name: Kim [name] [facility] Medical record number: [id number] Date of birth: [birth year] Age: 78 y.o. Gender: female PCP: Leodis Sias, MD, MD  Medical Service: Sharen Heck  Attending physician:   Dr. Aundria Rud  1st Contact:  Dr. Dorise Hiss  [phone] 2nd Contact:  Dr. Dorthula Rue  Pager:[phone] After 5 pm or weekends: 1st Contact:      Pager: [phone] 2nd Contact:      Pager: [phone]  Chief Complaint: bright red blood per rectum  History of Present Illness: The patient is an 78 YO woman who came to the ED today for one episode of bright red blood per rectum. She has not had a bowel movement in multiple days and felt she would have one this morning. She was straining to go and was having some abdominal pain and nausea and looked in the bowl and saw red and clot material in the commode and then while wiping was wiping some thick red from herself. She states that she normally goes about 4-5 days between bowel movements. She is on anti-coagulation therapy with xarelto which she takes once daily. She was switched in January from coumadin due to multiple high and variable INR levels. She has not fallen recently and has not been vomiting. She has not had any episodes like this is the past. She has never had colonoscopy. She does have a history of atrial fibrillation which is why she is anticoagulated.   Meds: Medications Prior to Admission  Medication Dose Route Frequency Provider Last Rate Last Dose  . 0.9 %  sodium chloride infusion   Intravenous Once Toy Baker, MD 125 mL/hr at 12/31/11 1414 1,000 mL at 12/31/11 1414  . morphine 2 MG/ML injection 2 mg  2 mg Intravenous Once Toy Baker, MD   2 mg at 12/31/11 1433   Medications Prior to Admission  Medication Sig Dispense Refill  . cloNIDine (CATAPRES) 0.2 MG tablet TAKE ONE TABLET TWICE DAILY  180 tablet  2  . digoxin (LANOXIN) 0.125 MG tablet Take 125 mcg by mouth daily.        Marland Kitchen  diltiazem (DILACOR XR) 240 MG 24 hr capsule Take 240 mg by mouth daily.        . metoprolol (TOPROL-XL) 200 MG 24 hr tablet Take 1 tablet (200 mg total) by mouth daily.  30 tablet  6  . potassium chloride SA (K-DUR,KLOR-CON) 20 MEQ tablet Take 40 mEq by mouth 2 (two) times daily.        . Rivaroxaban (XARELTO) 15 MG TABS tablet Take 1 tablet (15 mg total) by mouth daily.  30 tablet  6  . latanoprost (XALATAN) 0.005 % ophthalmic solution Place 1 drop into both eyes at bedtime. Use as directed        Allergies: Review of patient's allergies indicates no known allergies.  Past Medical History  Diagnosis Date  . HTN (hypertension)   . AF (atrial fibrillation)     On Xarelto as of 11/2011  . Diastolic heart failure     Echo 5/11: There was moderate concentric hypertrophy. Systolic function was vigorous. The best esimated ejection fraction was in the range of 65% to 70%. Wall motion was normal  . Gout   . RA (rheumatoid arthritis)   . Chronic renal insufficiency     Baseline approx: 1.7  . Glaucoma     No recent eye visits noted in EMR.   . Pulmonary artery hypertension  Per 5/11 echo, PA peak pressure: 69mm Hg  . Stroke   . Coronary artery disease    Past Surgical History  Procedure Date  . Cardiac catheterization 4/02     Minimal luminal irregularities without significant focal stenosis.  Marland Kitchen Extracapsular cataract extraction with intraocular lens 11/08    Right eye, Dr. Mitzi Davenport   Family History  Problem Relation Age of Onset  . Diabetes Mother   . Heart disease Father 30    Currently deceased  . Stroke Sister 52  . Stroke Mother 59   History   Social History  . Marital Status: Widowed    Spouse Name: N/A    Number of Children: N/A  . Years of Education: N/A   Occupational History  . 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    Social History Main Topics  . Smoking status: Former Smoker    Types: Cigarettes    Quit date: 10/11/1971  . Smokeless  tobacco: Not on file  . Alcohol Use: No  . Drug Use: No  . Sexually Active: No   Other Topics Concern  . Not on file   Social History Narrative   Financial assistance approved for 25% discount after Medicare pays for [facility] only, not eligible for [facility] card.Has [place] health care, medicare.  Currently lives with her daughter and her 2 granddaughters. Has 3 children in town which she is close to and is able to rely on if needed.  High school education. She normally lives alone and can ambulate with a walker. She normally walks herself to the bathroom and perform basic ADLs.    Review of Systems: Pertinent items are noted in HPI.  Physical Exam: Blood pressure 165/144, pulse 82, temperature 98.3 F (36.8 C), temperature source Oral, resp. rate 20, SpO2 98.00%. General: resting in bed, lying on side HEENT: PERRL, EOMI, no scleral icterus Cardiac: S1 S2 heard, tachy Pulm: clear to auscultation bilaterally, moving normal volumes of air Abd: soft, mildly tender, nondistended, BS present Rectal: lots of stool in the vault with blood and clot material blood continues to ooze out of her rectum which is tender Ext: warm and well perfused, no pedal edema Neuro: alert and oriented X3, cranial nerves II-XII grossly intact   Lab results: Basic Metabolic Panel:  Basename 12/31/11 1345  NA 140  K 3.6  CL 100  CO2 24  GLUCOSE 161*  BUN 21  CREATININE 1.60*  CALCIUM 9.8  MG --  PHOS --   Liver Function Tests:  Basename 12/31/11 1345  AST 44*  ALT 19  ALKPHOS 140*  BILITOT 1.2  PROT 7.0  ALBUMIN 3.4*   CBC:  Basename 12/31/11 1345  WBC 7.5  NEUTROABS 3.4  HGB 13.5  HCT 39.4  MCV 90.6  PLT 207   Coagulation:  Basename 12/31/11 1345  LABPROT 21.6*  INR 1.84*   Urine Drug Screen: Drugs of Abuse     Component Value Date/Time   LABOPIA NONE DETECTED 01/26/2010 1130   COCAINSCRNUR NONE DETECTED 01/26/2010 1130   LABBENZ POSITIVE* 01/26/2010 1130   AMPHETMU NONE DETECTED  01/26/2010 1130   THCU NONE DETECTED 01/26/2010 1130   LABBARB  Value: NONE DETECTED        DRUG SCREEN FOR MEDICAL PURPOSES ONLY.  IF CONFIRMATION IS NEEDED FOR ANY PURPOSE, NOTIFY LAB WITHIN 5 DAYS.        LOWEST DETECTABLE LIMITS FOR URINE DRUG SCREEN Drug Class  Cutoff (ng/mL) Amphetamine      1000 Barbiturate      200 Benzodiazepine   200 Tricyclics       300 Opiates          300 Cocaine          300 THC              50 01/26/2010 1130    Assessment & Plan by Problem:  GI bleed - Likely lower GI source given the bright red blood per rectum. Blood in the rectal vault. Likely related to xarelto usage vs diverticulosis given chronic constipation. Will follow CBC's q 12 hours and continue to monitor output for blood. She is already type and screened for blood although she is stable at this time and not requiring blood. Does have IV access and is receiving IVF. Will consider GI consult in the AM and keep NPO after midnight. Will hold xarelto. No cause to put reversal policy into effect at this time.  Will watch on telemetry overnight.   Tachycardia - Likely due to pain and will give fluids and pain relief. Will watch on telemetry overnight.   HYPERTENSION - BP is not elevated at this time. Will continue her diltiazem and amiodarone at this time. Will not restart her metoprolol.   Chronic diastolic heart failure - Will not restart beta blocker at this time as her BP is not elevated and may decrease if she continues to bleed. Continue digoxin.   Atrial fibrillation with RVR - Continue digoxin, amiodarone, diltiazem.   DVT ppx - SCD's as current GI bleed.    SignedGenella Mech 12/31/2011, 3:57 PM

## 2012-01-01 ENCOUNTER — Other Ambulatory Visit: Payer: Self-pay

## 2012-01-01 LAB — GLUCOSE, CAPILLARY
Glucose-Capillary: 174 mg/dL — ABNORMAL HIGH (ref 70–99)
Glucose-Capillary: 134 mg/dL — ABNORMAL HIGH (ref 70–99)

## 2012-01-01 LAB — CBC
HCT: 35.7 % — ABNORMAL LOW (ref 36.0–46.0)
Hemoglobin: 11.9 g/dL — ABNORMAL LOW (ref 12.0–15.0)
MCH: 30.4 pg (ref 26.0–34.0)
MCHC: 33.3 g/dL (ref 30.0–36.0)
Platelets: 174 10*3/uL (ref 150–400)
RBC: 3.91 MIL/uL (ref 3.87–5.11)
RDW: 14.6 % (ref 11.5–15.5)
WBC: 11.1 10*3/uL — ABNORMAL HIGH (ref 4.0–10.5)

## 2012-01-01 LAB — COMPREHENSIVE METABOLIC PANEL
AST: 30 U/L (ref 0–37)
Albumin: 2.8 g/dL — ABNORMAL LOW (ref 3.5–5.2)
Alkaline Phosphatase: 118 U/L — ABNORMAL HIGH (ref 39–117)
Chloride: 105 mEq/L (ref 96–112)
Potassium: 3.5 mEq/L (ref 3.5–5.1)
Total Bilirubin: 1.4 mg/dL — ABNORMAL HIGH (ref 0.3–1.2)

## 2012-01-01 LAB — MRSA PCR SCREENING: MRSA by PCR: NEGATIVE

## 2012-01-01 MED ORDER — METOCLOPRAMIDE HCL 5 MG/ML IJ SOLN
10.0000 mg | Freq: Once | INTRAMUSCULAR | Status: AC
  Administered 2012-01-01: 10 mg via INTRAVENOUS
  Filled 2012-01-01: qty 2

## 2012-01-01 MED ORDER — HYDROCORTISONE 2.5 % RE CREA
TOPICAL_CREAM | Freq: Three times a day (TID) | RECTAL | Status: DC
  Administered 2012-01-01 – 2012-01-04 (×10): via RECTAL
  Filled 2012-01-01: qty 28.35

## 2012-01-01 MED ORDER — DOCUSATE SODIUM 100 MG PO CAPS
100.0000 mg | ORAL_CAPSULE | Freq: Two times a day (BID) | ORAL | Status: DC
  Administered 2012-01-01 – 2012-01-04 (×5): 100 mg via ORAL
  Filled 2012-01-01 (×8): qty 1

## 2012-01-01 MED ORDER — PSYLLIUM 95 % PO PACK
1.0000 | PACK | Freq: Every day | ORAL | Status: DC
  Administered 2012-01-01 – 2012-01-04 (×3): 1 via ORAL
  Filled 2012-01-01 (×4): qty 1

## 2012-01-01 MED ORDER — OXYCODONE-ACETAMINOPHEN 5-325 MG PO TABS
1.0000 | ORAL_TABLET | ORAL | Status: DC | PRN
  Administered 2012-01-01 (×2): 1 via ORAL
  Filled 2012-01-01 (×3): qty 1

## 2012-01-01 NOTE — Progress Notes (Addendum)
Subjective: The patient is lying in bed and was up all night with some stool leaking with small amounts of blood. She is feeling slightly nauseated and having some burning pain in her rectum. No fevers or chills overnight. No vomiting overnight. No chest pain, no abdominal pain.   Objective: Vital signs in last 24 hours: Filed Vitals:   12/31/11 1530 12/31/11 1621 12/31/11 2017 01/01/12 0435  BP: 165/144 175/96 168/85 138/63  Pulse: 82 102 82 62  Temp:  97.2 F (36.2 C) 98.2 F (36.8 C) 98.7 F (37.1 C)  TempSrc:  Oral Oral Oral  Resp: 20 20 19 20   Height:  5\' 4"  (1.626 m)    Weight:  132 lb 4.4 oz (60 kg)    SpO2: 98% 94% 91% 98%   Weight change:   Intake/Output Summary (Last 24 hours) at 01/01/12 1014 Last data filed at 01/01/12 1000  Gross per 24 hour  Intake 2112.92 ml  Output      0 ml  Net 2112.92 ml   Physical Exam: General: resting in bed, lying on her side HEENT: PERRL, EOMI, no scleral icterus Cardiac: RRR, no rubs, murmurs or gallops Pulm: clear to auscultation bilaterally, moving normal volumes of air Abd: soft, nontender, nondistended, BS present Ext: warm and well perfused, no pedal edema Neuro: alert and oriented X3, cranial nerves II-XII grossly intact  Lab Results: Basic Metabolic Panel:  Lab 01/01/12 4540 12/31/11 1345  NA 141 140  K 3.5 3.6  CL 105 100  CO2 23 24  GLUCOSE 96 161*  BUN 21 21  CREATININE 1.36* 1.60*  CALCIUM 8.6 9.8  MG -- --  PHOS -- --   Liver Function Tests:  Lab 01/01/12 0557 12/31/11 1345  AST 30 44*  ALT 13 19  ALKPHOS 118* 140*  BILITOT 1.4* 1.2  PROT 5.9* 7.0  ALBUMIN 2.8* 3.4*   CBC:  Lab 01/01/12 0557 12/31/11 1900 12/31/11 1345  WBC 11.1* 10.8* --  NEUTROABS -- -- 3.4  HGB 11.2* 13.4 --  HCT 34.5* 39.6 --  MCV 91.5 89.8 --  PLT 174 176 --   Coagulation:  Lab 12/31/11 1345 12/26/11 1051  LABPROT 21.6* --  INR 1.84* 1.0   Medications: I have reviewed the patient's current medications. Scheduled  Meds:    . sodium chloride   Intravenous Once  . amiodarone  200 mg Oral BID  . antiseptic oral rinse  15 mL Mouth Rinse q12n4p  . chlorhexidine  15 mL Mouth Rinse BID  . cloNIDine  0.2 mg Oral BID  . digoxin  125 mcg Oral Daily  . diltiazem  240 mg Oral Daily  . docusate sodium  100 mg Oral BID  . hydrocortisone   Rectal TID  . latanoprost  1 drop Both Eyes QHS  . metoprolol succinate  200 mg Oral Daily  .  morphine injection  2 mg Intravenous Once  . pantoprazole  40 mg Oral Q1200  . psyllium  1 packet Oral Daily   Continuous Infusions:    . DISCONTD: sodium chloride 100 mL/hr at 12/31/11 2357   PRN Meds:.acetaminophen, acetaminophen, ondansetron (ZOFRAN) IV, ondansetron, oxyCODONE-acetaminophen, DISCONTD:  morphine injection Assessment/Plan: GI bleed - Pt is still having some blood in her stools overnight. She has had drop in Hg overnight. We will continue to watch her CBCs twice daily and make sure that she hemodynamically stable. We will feed her today. Using anusol cream on her rectum for the burning. Will use stool softener  and metamucil for her constipation.    HYPERTENSION - BP is controlled on her home medications. Will continue to watch.    Chronic diastolic heart failure - Pt is on digoxin and will continue to watch. Holding xarelto at this time.    Atrial fibrillation with RVR - Pt is on her diltiazem, amiodarone, metoprolol. Will discuss with cardiology if she should continue amiodarone and she is still in atrial fibrillation. Also will discuss with Dr. Alexandria Lodge whether the xarelto should be held or continued or if there is any indication for decreased dosing. Verified today and not clear she is supposed to be on amiodarone so will D/C and confirm tomorrow.   Disposition - Pt will eat today and watch her blood counts. If she eats okay and has bowel movement may be stable for discharge tomorrow.    LOS: 1 day   Kim [name], Kim [name] 01/01/2012, 10:14 AM

## 2012-01-01 NOTE — H&P (Signed)
IM Attending  60 woman with afib on rivaroxaban admitted now for hematochezia while straining with constipation.  Seemed to be a large bleed but hgb is minimally changed from baseline.  She is uncomfortable but this seems to be only the constipation.  All other labs are at baseline.  Abd slightly tender. Would keep 1 more day to follow hgb, notify Dr. Alexandria Lodge (and decide about any alteration in anti-coag), and treat constipation.  Ask Dr. Algie Coffer about need for amiodarone since she is in Afib. Agree with Dr. Lavonna Monarch notes and plans

## 2012-01-01 NOTE — Progress Notes (Signed)
Report called to [facility], receiving nurse on 2900 @ 1600. Pt to transfer to 2924 and pt has tried to call family to notify them, no one answered. Upon returning to the unit RN was informed pts family came to see her and they where informed of pts transfer. Julien [facility]

## 2012-01-01 NOTE — Progress Notes (Addendum)
Pt had another pause this one was 4.03 seconds, BP 123/73, Pt in bed and has no specific complaints just states " I feel sick". Dr. Dierdre Searles notified and orders obtained to transfer to cardiac tele. Kim [name] [facility]

## 2012-01-01 NOTE — Plan of Care (Signed)
Problem: Phase I Progression Outcomes Goal: Other Phase I Outcomes/Goals Outcome: Completed/Met Date Met:  01/01/12 Pt to return home with daughter when medically cleared.

## 2012-01-01 NOTE — Progress Notes (Signed)
MD notified at approximately 0030 on 01/01/12 that patient had a four beat run of vtach.  She denied chest pain.  VS WNL.  She has a positive history for A-fib with RVR.  MD stated to continue to monitor patient.  Will continue to monitor patient.  Nahiem Dredge, Joan Mayans, RN

## 2012-01-01 NOTE — Progress Notes (Signed)
Pt had a 2.2 sec pause on Tele. Pt in bed sleeping BP currently BP is 112/71, HR is running 58-62 on tele.Dr. Dierdre Searles notified, meds reviewed with MD. Pt asymptomatic and currently in bed drinking her full liquid tray. Will continue to monitor. Julien Nordmann [facility]

## 2012-01-02 LAB — CBC
MCV: 90.6 fL (ref 78.0–100.0)
Platelets: 168 10*3/uL (ref 150–400)
RBC: 3.81 MIL/uL — ABNORMAL LOW (ref 3.87–5.11)
RDW: 14.4 % (ref 11.5–15.5)
WBC: 10.5 10*3/uL (ref 4.0–10.5)

## 2012-01-02 LAB — GLUCOSE, CAPILLARY: Glucose-Capillary: 124 mg/dL — ABNORMAL HIGH (ref 70–99)

## 2012-01-02 MED ORDER — RIVAROXABAN 15 MG PO TABS
15.0000 mg | ORAL_TABLET | Freq: Every day | ORAL | Status: DC
  Administered 2012-01-03 – 2012-01-04 (×2): 15 mg via ORAL
  Filled 2012-01-02 (×4): qty 1

## 2012-01-02 MED ORDER — HYDROCORTISONE ACETATE 25 MG RE SUPP
25.0000 mg | Freq: Two times a day (BID) | RECTAL | Status: DC
  Administered 2012-01-02 – 2012-01-04 (×5): 25 mg via RECTAL
  Filled 2012-01-02 (×7): qty 1

## 2012-01-02 NOTE — Progress Notes (Signed)
Subjective: Pt states that she is still having some stool incontinence with urination. She has been up to the chair yesterday. She is feeling okay but weak. Her gout seems to be acting up in her knee and she is still having some burning in and around her rectum. No other complaints, no chest pain, no nausea, no vomiting.   Objective: Vital signs in last 24 hours: Filed Vitals:   01/02/12 0200 01/02/12 0400 01/02/12 0500 01/02/12 0731  BP: 142/74 134/73 134/92 135/96  Pulse: 70 69 74 79  Temp:   98.4 F (36.9 C) 98.1 F (36.7 C)  TempSrc:    Oral  Resp: 21 21 20 19   Height:      Weight:      SpO2: 100% 100% 100% 99%   Weight change:   Intake/Output Summary (Last 24 hours) at 01/02/12 0843 Last data filed at 01/02/12 0800  Gross per 24 hour  Intake 732.92 ml  Output    600 ml  Net 132.92 ml   Physical Exam: General: resting in bed, lying on her side  HEENT: PERRL, EOMI, no scleral icterus  Cardiac: RRR, no rubs, murmurs or gallops  Pulm: clear to auscultation bilaterally, moving normal volumes of air  Abd: soft, nontender, nondistended, BS present  Ext: warm and well perfused, no pedal edema  Neuro: alert and oriented X3, cranial nerves II-XII grossly intact  Lab Results: Basic Metabolic Panel:  Lab 01/01/12 4098 12/31/11 1345  NA 141 140  K 3.5 3.6  CL 105 100  CO2 23 24  GLUCOSE 96 161*  BUN 21 21  CREATININE 1.36* 1.60*  CALCIUM 8.6 9.8  MG -- --  PHOS -- --   Liver Function Tests:  Lab 01/01/12 0557 12/31/11 1345  AST 30 44*  ALT 13 19  ALKPHOS 118* 140*  BILITOT 1.4* 1.2  PROT 5.9* 7.0  ALBUMIN 2.8* 3.4*   CBC:  Lab 01/02/12 0450 01/01/12 1709 12/31/11 1345  WBC 10.5 11.6* --  NEUTROABS -- -- 3.4  HGB 11.4* 11.9* --  HCT 34.5* 35.7* --  MCV 90.6 91.3 --  PLT 168 178 --    Lab 01/01/12 2119 01/01/12 1707 01/01/12 1250  GLUCAP 124* 134* 174*   Coagulation:  Lab 12/31/11 1345 12/26/11 1051  LABPROT 21.6* --  INR 1.84* 1.0   Micro  Results: Recent Results (from the past 240 hour(s))  MRSA PCR SCREENING     Status: Normal   Collection Time   01/01/12  6:01 PM      Component Value Range Status Comment   MRSA by PCR NEGATIVE  NEGATIVE  Final    Studies/Results: No results found. Medications: I have reviewed the patient's current medications. Scheduled Meds:   . cloNIDine  0.2 mg Oral BID  . digoxin  125 mcg Oral Daily  . diltiazem  240 mg Oral Daily  . docusate sodium  100 mg Oral BID  . hydrocortisone   Rectal TID  . hydrocortisone  25 mg Rectal BID  . latanoprost  1 drop Both Eyes QHS  . metoCLOPramide (REGLAN) injection  10 mg Intravenous Once  . metoprolol succinate  200 mg Oral Daily  . pantoprazole  40 mg Oral Q1200  . psyllium  1 packet Oral Daily  . DISCONTD: amiodarone  200 mg Oral BID  . DISCONTD: antiseptic oral rinse  15 mL Mouth Rinse q12n4p  . DISCONTD: chlorhexidine  15 mL Mouth Rinse BID   Continuous Infusions:   . DISCONTD:  sodium chloride 100 mL/hr at 12/31/11 2357   PRN Meds:.acetaminophen, acetaminophen, ondansetron (ZOFRAN) IV, ondansetron, oxyCODONE-acetaminophen Assessment/Plan:  *GI bleed - possibly due to hemorrhoids, treating with anusol cream and suppository to relieve pain and burning. Looks weak today in the bed and will have PT come evaluate and treat patient and get recommendations for level of care at home. Hg are stable overnight and will recheck tomorrow morning prior to discharge.   Atrial fibrillation with RVR - did D/C amiodarone yesterday. She was given reglan for some nausea and afterwards had two documented pauses on telemetry. Was moved to a step down unit. Is stable to be moved back to telemetry today and will watch for one more day to ensure no more pauses for one day off amiodarone. Will discuss xarelto usage with Dr. Alexandria Lodge today but likely will go with xarelto.   HYPERTENSION - BP is stable on her current regiment with diltiazem, metoprolol, clonidine.   Chronic  diastolic heart failure - Not in exacerbation and stable on current regimen.   Disposition - Likely tomorrow.     LOS: 2 days   Genella Mech 01/02/2012, 8:43 AM

## 2012-01-02 NOTE — Progress Notes (Signed)
Nursing Note: Pt stable upon arrival to unit. Vital signs stable see doc flowsheets. Pt lying in bed cardiac monitor applied. Will continue to monitor pt. Cutter Passey Scientist, clinical (histocompatibility and immunogenetics).

## 2012-01-02 NOTE — Evaluation (Signed)
Physical Therapy Evaluation Patient Details Name: Kim [name] MRN: [id number] DOB: 05-May-1929 Today's Date: 01/02/2012  Problem List:  Patient Active Problem List  Diagnoses  . THYROID STIMULATING HORMONE, ABNORMAL  . VITAMIN D DEFICIENCY  . HYPERLIPIDEMIA  . GOUTY TOPHI  . HYPOKALEMIA  . ANXIETY  . GLAUCOMA  . HYPERTENSION  . Chronic diastolic heart failure  . PULMONARY FIBROSIS, INTERSTITIAL  . REFLUX, ESOPHAGEAL  . HIATAL HERNIA  . HEMOCCULT POSITIVE STOOL  . RENAL INSUFFICIENCY  . FATIGUE  . WEIGHT LOSS  . Nausea  . Impaired mobility and ADLs  . Atrial fibrillation with RVR  . Supratherapeutic INR  . GI bleed    Past Medical History:  Past Medical History  Diagnosis Date  . HTN (hypertension)   . AF (atrial fibrillation)     On Xarelto as of 11/2011  . Diastolic heart failure     Echo 5/11: There was moderate concentric hypertrophy. Systolic function was vigorous. The best esimated ejection fraction was in the range of 65% to 70%. Wall motion was normal  . Gout   . RA (rheumatoid arthritis)   . Chronic renal insufficiency     Baseline approx: 1.7  . Glaucoma     No recent eye visits noted in EMR.   . Pulmonary artery hypertension     Per 5/11 echo, PA peak pressure: 69mm Hg  . Stroke   . Coronary artery disease    Past Surgical History:  Past Surgical History  Procedure Date  . Cardiac catheterization 4/02     Minimal luminal irregularities without significant focal stenosis.  Marland Kitchen Extracapsular cataract extraction with intraocular lens 11/08    Right eye, Dr. Mitzi Davenport    PT Assessment/Plan/Recommendation PT Assessment Clinical Impression Statement: Patient admitted with ? GIB.  Found to have hemorrhoid causing bleeding.  Patient reports a recent admission and that she has gotten weaker over the last few months.  She reports that she has close to 24 hour care and that she is going home.  She states she has it arranged such that she can get up in  enclosed spaces.  Recommend HHPT and HHOT f/u.  Patient has equipment that she needs.   PT Recommendation/Assessment: Patient will need skilled PT in the acute care venue PT Problem List: Decreased activity tolerance;Decreased mobility;Decreased balance;Decreased safety awareness PT Therapy Diagnosis : Generalized weakness PT Plan PT Frequency: Min 3X/week PT Treatment/Interventions: Gait training;DME instruction;Functional mobility training;Therapeutic activities;Therapeutic exercise;Balance training;Patient/family education PT Recommendation Recommendations for Other Services: OT consult Follow Up Recommendations: Home health PT;Supervision/Assistance - 24 hour PT Goals  Acute Rehab PT Goals PT Goal Formulation: With patient Time For Goal Achievement: 2 weeks Pt will go Supine/Side to Sit: with modified independence PT Goal: Supine/Side to Sit - Progress: Goal set today Pt will go Sit to Stand: with supervision;with upper extremity assist PT Goal: Sit to Stand - Progress: Goal set today Pt will Transfer Bed to Chair/Chair to Bed: with modified independence PT Transfer Goal: Bed to Chair/Chair to Bed - Progress: Goal set today Pt will Ambulate: 1 - 15 feet;with min assist;with least restrictive assistive device PT Goal: Ambulate - Progress: Goal set today Pt will Perform Home Exercise Program: with supervision, verbal cues required/provided PT Goal: Perform Home Exercise Program - Progress: Goal set today  PT Evaluation Precautions/Restrictions  Precautions Precautions: Fall Required Braces or Orthoses: No Restrictions Weight Bearing Restrictions: No Prior Functioning  Home Living Lives With: Family;Daughter (and granddaughters; only alone about an hour or  two a day) Receives Help From: Family Type of Home: House Home Layout: One level Home Access: Ramped entrance Bathroom Shower/Tub: Engineer, manufacturing systems: Handicapped height (needs rails on the toilet as 3N1 is  beside bed) Bathroom Accessibility: Yes How Accessible: Accessible via walker Home Adaptive Equipment: Bedside commode/3-in-1;Tub transfer bench;Walker - four wheeled;Straight cane;Wheelchair - manual;Hand-held shower hose Prior Function Level of Independence: Needs assistance with ADLs Bath: Minimal (help to get out of shower/tub) Toileting: Minimal (help when goes into bathroom) Dressing: Minimal (shoes only) Driving: No Vocation: Retired Comments: Daughter and granddaughters have lived with patient since May 2011 Cognition Cognition Arousal/Alertness: Awake/alert Overall Cognitive Status: Appears within functional limits for tasks assessed Sensation/Coordination Sensation Light Touch: Appears Intact Stereognosis: Not tested Hot/Cold: Not tested Proprioception: Not tested Coordination Gross Motor Movements are Fluid and Coordinated: Yes Fine Motor Movements are Fluid and Coordinated: Yes Extremity Assessment RUE Assessment RUE Assessment: Exceptions to [facility] RUE Strength RUE Overall Strength: Deficits;Due to premorbid status (rheumatoid arthritis ) LUE Assessment LUE Assessment: Exceptions to [facility] LUE Strength LUE Overall Strength: Deficits;Due to premorbid status (due to rheumatoid arthritis) RLE Assessment RLE Assessment: Exceptions to [facility] RLE Strength RLE Overall Strength: Deficits RLE Overall Strength Comments: grossly 3-/5 LLE Assessment LLE Assessment: Exceptions to [facility] LLE Strength LLE Overall Strength: Deficits LLE Overall Strength Comments: grossly 3-/5 Mobility (including Balance) Bed Mobility Bed Mobility: Yes Rolling Right: With rail;4: Min assist Rolling Right Details (indicate cue type and reason): cues for technique and assist Right Sidelying to Sit: 3: Mod assist;With rails Right Sidelying to Sit Details (indicate cue type and reason): Cues for technique and assist to achieve full upright from sidelying. Transfers Transfers: Yes Sit to Stand: 1: +2  Total assist;Patient percentage (comment);From bed;With upper extremity assist (pt = 60%) Sit to Stand Details (indicate cue type and reason): Patient needed +2 assist to perform sit to stand as she has difficulty initiating movement.  Patient with poor posture with kyphosis and has rhematoid arthritis and gout per patient.  Patient with incr weight shift to her right.   Stand to Sit: 4: Min assist;With upper extremity assist;With armrests;To chair/3-in-1 Stand to Sit Details: Assist to control descent Stand Pivot Transfers: 1: +2 Total assist;Patient percentage (comment) (pt = 60%) Stand Pivot Transfer Details (indicate cue type and reason): Patient needed +2 HHA for transfer but she states that at home she has it set up so that she does it herself.  Takes incr time with poor movement patterns overall.   Ambulation/Gait Ambulation/Gait: No Stairs: No Wheelchair Mobility Wheelchair Mobility: No  Posture/Postural Control Posture/Postural Control: Postural limitations Postural Limitations: Flexed at neck, trunk, hips and knees.  kyphotic thoracic spine. Balance Balance Assessed: No  End of Session PT - End of Session Equipment Utilized During Treatment: Gait belt Activity Tolerance: Patient limited by fatigue;Patient limited by pain Patient left: in chair;with call bell in reach Nurse Communication: Mobility status for transfers General Behavior During Session: [facility] for tasks performed Cognition: [facility] for tasks performed  INGOLD,Ksean Vale 01/02/2012, 3:59 PM  [facility] [phone] (pager)

## 2012-01-02 NOTE — Progress Notes (Signed)
IM Attending  Moved to step-down for pauses.  We had stopped amiodarone but she had not even missed the first dose.  We had continued her diltiazem and metoprolol.  She appears to have chronic a flutter, probably complexed with a fib.  Would ask Dr. Algie Coffer to advise [place] about her drug regimen.  I am unsure why we would use amiodarone if she is to stay in flutter/fib.  The pauses followed a dose of reglan so her chronic dilt & metoprolol regimen may not be at fault.

## 2012-01-03 LAB — CBC
Hemoglobin: 11.3 g/dL — ABNORMAL LOW (ref 12.0–15.0)
MCH: 29.7 pg (ref 26.0–34.0)
Platelets: 180 10*3/uL (ref 150–400)
RBC: 3.8 MIL/uL — ABNORMAL LOW (ref 3.87–5.11)
WBC: 7.7 10*3/uL (ref 4.0–10.5)

## 2012-01-03 NOTE — Progress Notes (Addendum)
Met with pt to discuss d/c needs, Pt familiar to this case manager from recent visits. Pt daughter lives with pt and works from 12 noon until pm. She also has 2 granddaughters who assist with care, however are both in school and have classes at various times. Pt declined HHPT/OT as this is not convenient for her and her family, as they all have a "schedule" and if PT comes when no one is there she would not be able to let the therapist in the door. [name] also continues to state that her daughter will be moving out soon, and this CM questioned why she would be moving with pt condition such as it is. Pt insist that her daughter needs to go to her own house to keep someone from stealing her things.  This pt and the equipment she has are a bit of a mystery, she states that her insurance company called and said that she was eligible for a ramp., Send workers who built the ramp, then realizing that she was not wheelchair bound insisted that she get a prescription for a wheelchair from her doctor, which she did. She also states that they sent someone to her home to place hold bars in her bath tub and recommended an elevated commode seat. As pt received a 3:1 commode several months ago she is not eligible for aother commode device at this time unless this insurance company would like to send it to her home. Pt was encouraged to call the name she has at home for assistance with additional equipment as CM is unable to obtain due to recent purchases.  Pt was assured that she does not have to accept HHPT/OT however it was recommended.  MD please discuss need for rehab with this pt.  Kim Shock RN MPH Case Manager [phone]       CARE MANAGEMENT NOTE 01/03/2012  Patient:  Kim [name], Kim [name]   Account Number:  [id number]  Date Initiated:  01/03/2012  Documentation initiated by:  Baya Lentz  Subjective/Objective Assessment:   Order for HHPT and HHOT     Action/Plan:   Met with pt re HH needs, and  pt declined HH services   Anticipated DC Date:  01/05/2012   Anticipated DC Plan:  HOME/SELF CARE         Choice offered to / List presented to:  C-1 Patient           Status of service:  Completed, signed off Medicare Important Message given?   (If response is "NO", the following Medicare IM given date fields will be blank) Date Medicare IM given:   Date Additional Medicare IM given:    Discharge Disposition:    Per UR Regulation:    Comments:  01/03/2012 Pt declined HH, due to schedules of pt, daughter and granddaughters. Will ask MD to encourage pt to accept HHPT/OT. Kim Shock RN MPH Case Manager [phone]

## 2012-01-03 NOTE — Consult Note (Signed)
Reason for Consult: Recurrent pauses on multiple AV blocking medications and antiarrhythmic medications Referring Physician: Dr. Leta [name] is an 78 y.o. female.  HPI: Patient is 78 year old female with past medical history significant for hypertension, hypertensive heart disease with diastolic dysfunction, chronic atrial fibrillation, history of CVA, GERD, height is hernia, history of gouty arthritis, was admitted because of rectal bleeding which was felt to be hemorrhoidal bleeding. Cardiologic consultation is called as patient was noted to have multiple episodes of pauses between 2-4 seconds since yesterday. Her amiodarone and Cardizem has been DC'd. Today maximum pons has been up to 2 seconds. Patient denies any lightheadedness dizziness or syncopal episode. Patient denies any chest pain nausea vomiting diaphoresis. Denies history of PND orthopnea leg swelling. Denies any further lower GI bleeding. Patient continues to have significant pain in the joints do to gouty arthritis. Patient has been restarted on xerralto which she is tolerating it well  Past Medical History  Diagnosis Date  . HTN (hypertension)   . AF (atrial fibrillation)     On Xarelto as of 11/2011  . Diastolic heart failure     Echo 5/11: There was moderate concentric hypertrophy. Systolic function was vigorous. The best esimated ejection fraction was in the range of 65% to 70%. Wall motion was normal  . Gout   . RA (rheumatoid arthritis)   . Chronic renal insufficiency     Baseline approx: 1.7  . Glaucoma     No recent eye visits noted in EMR.   . Pulmonary artery hypertension     Per 5/11 echo, PA peak pressure: 69mm Hg  . Stroke   . Coronary artery disease     Past Surgical History  Procedure Date  . Cardiac catheterization 4/02     Minimal luminal irregularities without significant focal stenosis.  Marland Kitchen Extracapsular cataract extraction with intraocular lens 11/08    Right eye, Dr. Mitzi [name]     Family History  Problem Relation Age of Onset  . Diabetes Mother   . Heart disease Father 38    Currently deceased  . Stroke Sister 78  . Stroke Mother 67    Social History:  reports that she quit smoking about 40 years ago. Her smoking use included Cigarettes. She does not have any smokeless tobacco history on file. She reports that she does not drink alcohol or use illicit drugs.  Allergies: No Known Allergies  Medications: I have reviewed the patient's current medications.  Results for orders placed during the hospital encounter of 12/31/11 (from the past 48 hour(s))  GLUCOSE, CAPILLARY     Status: Abnormal   Collection Time   01/01/12  9:19 PM      Component Value Range Comment   Glucose-Capillary 124 (*) 70 - 99 (mg/dL)   CBC     Status: Abnormal   Collection Time   01/02/12  4:50 AM      Component Value Range Comment   WBC 10.5  4.0 - 10.5 (K/uL)    RBC 3.81 (*) 3.87 - 5.11 (MIL/uL)    Hemoglobin 11.4 (*) 12.0 - 15.0 (g/dL)    HCT 14.7 (*) 82.9 - 46.0 (%)    MCV 90.6  78.0 - 100.0 (fL)    MCH 29.9  26.0 - 34.0 (pg)    MCHC 33.0  30.0 - 36.0 (g/dL)    RDW 56.2  13.0 - 86.5 (%)    Platelets 168  150 - 400 (K/uL)   CBC  Status: Abnormal   Collection Time   01/03/12  4:55 AM      Component Value Range Comment   WBC 7.7  4.0 - 10.5 (K/uL)    RBC 3.80 (*) 3.87 - 5.11 (MIL/uL)    Hemoglobin 11.3 (*) 12.0 - 15.0 (g/dL)    HCT 82.9 (*) 56.2 - 46.0 (%)    MCV 90.5  78.0 - 100.0 (fL)    MCH 29.7  26.0 - 34.0 (pg)    MCHC 32.8  30.0 - 36.0 (g/dL)    RDW 13.0  86.5 - 78.4 (%)    Platelets 180  150 - 400 (K/uL)     No results found.  Review of Systems  Constitutional: Negative for fever and chills.  Eyes: Negative for blurred vision and double vision.  Respiratory: Negative for cough and hemoptysis.   Cardiovascular: Negative for chest pain, palpitations, orthopnea, claudication and leg swelling.  Gastrointestinal: Negative for nausea and vomiting.  Genitourinary:  Negative for dysuria and urgency.  Musculoskeletal: Positive for myalgias and joint pain.  Neurological: Negative for dizziness, tingling and headaches.   Blood pressure 127/64, pulse 80, temperature 97.6 F (36.4 C), temperature source Oral, resp. rate 18, height 5\' 4"  (1.626 m), weight 61.2 kg (134 lb 14.7 oz), SpO2 100.00%. Physical Exam  Constitutional: She is oriented to person, place, and time. She appears well-developed and well-nourished.  HENT:  Head: Normocephalic and atraumatic.  Eyes: Conjunctivae are normal. Left eye exhibits no discharge. No scleral icterus.  Neck: Normal range of motion. Neck supple. No thyromegaly present.  Cardiovascular:       Irregularly irregular S1-S2 normal there is soft systolic murmur noted no S3 gallop  Respiratory: Effort normal and breath sounds normal. No respiratory distress. She has no wheezes. She has no rales.  GI: Soft. Bowel sounds are normal. She exhibits no distension. There is no tenderness.  Musculoskeletal: She exhibits no tenderness.  Lymphadenopathy:    She has no cervical adenopathy.  Neurological: She is alert and oriented to person, place, and time.    Assessment/Plan: Chronic atrial fibrillation with pauses secondary to multiple AV blocking meds plus amiodarone Hypertension Hypertensive heart disease with diastolic dysfunction History of CVA GERD Hiatus hernia Gouty arthritis Plan Agree with stopping amiodarone, Cardizem and digoxin for now Continue metoprolol per orders Followup on telemetry for next 24 hours if no significant pauses about 3 seconds okay to discharge from cardiac point of view   Kim [name] N 01/03/2012, 6:35 PM

## 2012-01-03 NOTE — Progress Notes (Signed)
Subjective: Pt states that she is still having some stool incontinence with urination. She has been up to the chair yesterday. She is feeling okay but weak. Her gout seems to be acting up in her knee and she is still having some burning in and around her rectum. No other complaints, no chest pain, no nausea, no vomiting.   Objective: Vital signs in last 24 hours: Filed Vitals:   01/02/12 1859 01/02/12 2042 01/03/12 0243 01/03/12 0712  BP: 148/68 151/69 156/65 150/93  Pulse: 76 76 78 77  Temp: 97.5 F (36.4 C) 98.2 F (36.8 C) 98.3 F (36.8 C) 98.2 F (36.8 C)  TempSrc: Oral Oral Oral Oral  Resp: 20 20 18 18   Height: 5\' 4"  (1.626 m)     Weight: 132 lb 15 oz (60.3 kg)   134 lb 14.7 oz (61.2 kg)  SpO2: 94% 93% 100% 100%   Weight change:   Intake/Output Summary (Last 24 hours) at 01/03/12 1133 Last data filed at 01/03/12 0900  Gross per 24 hour  Intake    420 ml  Output    301 ml  Net    119 ml   Physical Exam: General: resting in bed, lying on her side  HEENT: PERRL, EOMI, no scleral icterus  Cardiac: RRR, no rubs, murmurs or gallops  Pulm: clear to auscultation bilaterally, moving normal volumes of air  Abd: soft, nontender, nondistended, BS present  Ext: warm and well perfused, no pedal edema  Neuro: alert and oriented X3, cranial nerves II-XII grossly intact  Lab Results: Basic Metabolic Panel:  Lab 01/01/12 1610 12/31/11 1345  NA 141 140  K 3.5 3.6  CL 105 100  CO2 23 24  GLUCOSE 96 161*  BUN 21 21  CREATININE 1.36* 1.60*  CALCIUM 8.6 9.8  MG -- --  PHOS -- --   Liver Function Tests:  Lab 01/01/12 0557 12/31/11 1345  AST 30 44*  ALT 13 19  ALKPHOS 118* 140*  BILITOT 1.4* 1.2  PROT 5.9* 7.0  ALBUMIN 2.8* 3.4*   CBC:  Lab 01/03/12 0455 01/02/12 0450 12/31/11 1345  WBC 7.7 10.5 --  NEUTROABS -- -- 3.4  HGB 11.3* 11.4* --  HCT 34.4* 34.5* --  MCV 90.5 90.6 --  PLT 180 168 --    Lab 01/01/12 2119 01/01/12 1707 01/01/12 1250  GLUCAP 124* 134* 174*    Coagulation:  Lab 12/31/11 1345  LABPROT 21.6*  INR 1.84*   Micro Results: Recent Results (from the past 240 hour(s))  MRSA PCR SCREENING     Status: Normal   Collection Time   01/01/12  6:01 PM      Component Value Range Status Comment   MRSA by PCR NEGATIVE  NEGATIVE  Final    Studies/Results: No results found. Medications: I have reviewed the patient's current medications. Scheduled Meds:    . cloNIDine  0.2 mg Oral BID  . digoxin  125 mcg Oral Daily  . docusate sodium  100 mg Oral BID  . hydrocortisone   Rectal TID  . hydrocortisone  25 mg Rectal BID  . latanoprost  1 drop Both Eyes QHS  . metoprolol succinate  200 mg Oral Daily  . pantoprazole  40 mg Oral Q1200  . psyllium  1 packet Oral Daily  . rivaroxaban  15 mg Oral Daily  . DISCONTD: diltiazem  240 mg Oral Daily   Continuous Infusions:  PRN Meds:.acetaminophen, acetaminophen, ondansetron (ZOFRAN) IV, ondansetron, oxyCODONE-acetaminophen Assessment/Plan:  *GI bleed -  possibly due to hemorrhoids, treating with anusol cream and suppository to relieve pain and burning. Looks weak today in the bed and will have PT/OT evaluate and treat patient and get recommendations for level of care at home. Hg are stable overnight and will not recheck further.  Atrial fibrillation with RVR - did D/C amiodarone yesterday. Continued to have pause overnight. Will stop diltiazem today and have Dr. Sharyn Lull see her today and evaluate her medical regimen.  Will watch for one more day to ensure no more pauses for one day off amiodarone. Have restarted xarelto as she is not currently bleeding.   HYPERTENSION - BP is stable on her current regiment with metoprolol, clonidine.   Chronic diastolic heart failure - Not in exacerbation and stable on current regimen.   Disposition - Likely tomorrow. Will need home PT OT help.    LOS: 3 days   Genella Mech 01/03/2012, 11:33 AM

## 2012-01-03 NOTE — Progress Notes (Signed)
IM Attending  Visited patient.  she continues to feel poorly.  Is having flare of gout in both knees and has ongoing pain in rectum, probably hemorrhoids.  1.)  She has a textbook-classic story of gout, unusually severe.  She is apparantly not on any preventive therapy, having had GI intolerance to allopurinol, and having been taken off probenecic and uloric at different times for unknown reasons.  This should be re-visited. she has extensive tophaceous disease and poly-articular, frequent gouty arthritis.  She denies hx of stones so probenecid is an option but first question is why is she not on uloric?  2.)  She has LLQ abd pain but minimal tenderness.  Mostly she complains of rectal pain.  She is on topical treatment for presumed hemorrhoids.  3.)  Apparantly had pauses off amiodarone.  Dilt stopped. agree with having Dr. Sharyn Lull see her to participate in decisions about AV Blocker therapy.  4.)  She should have full rehab assessment for discharge planning.  She lives with daughter and grandchildren but there is not always someone at home.  She needs an adjustment to her toilet in order to manage transfers.  she may need a formal spell of rehab for reconditioning and assessment of home safety.  As of now, she is a major fall risk.

## 2012-01-04 LAB — CBC
HCT: 34.3 % — ABNORMAL LOW (ref 36.0–46.0)
Hemoglobin: 11.3 g/dL — ABNORMAL LOW (ref 12.0–15.0)
MCV: 90 fL (ref 78.0–100.0)
RBC: 3.81 MIL/uL — ABNORMAL LOW (ref 3.87–5.11)
RDW: 14.1 % (ref 11.5–15.5)
WBC: 6.5 10*3/uL (ref 4.0–10.5)

## 2012-01-04 MED ORDER — HYDROCORTISONE 2.5 % RE CREA: TOPICAL_CREAM | Freq: Two times a day (BID) | RECTAL | Status: AC

## 2012-01-04 MED ORDER — DSS 100 MG PO CAPS: 100.0000 mg | ORAL_CAPSULE | Freq: Two times a day (BID) | ORAL | Status: AC | PRN

## 2012-01-04 MED ORDER — HYDROCORTISONE ACETATE 25 MG RE SUPP: 25.0000 mg | Freq: Two times a day (BID) | RECTAL | Status: AC

## 2012-01-04 MED ORDER — PSYLLIUM 95 % PO PACK: 1.0000 | PACK | Freq: Every day | ORAL | Status: DC

## 2012-01-04 MED ORDER — COLCHICINE 0.6 MG PO TABS
0.6000 mg | ORAL_TABLET | Freq: Every day | ORAL | Status: DC
  Filled 2012-01-04 (×2): qty 1

## 2012-01-04 NOTE — Progress Notes (Signed)
Subjective:  Patient denies any chest pain shortness of breath or dizziness No more further pauses on the monitor Complains of musculoskeletal joint pains  Objective:  Vital Signs in the last 24 hours: Temp:  [97.6 F (36.4 C)-98.9 F (37.2 C)] 98.7 F (37.1 C) (03/06 0538) Pulse Rate:  [80-89] 89  (03/06 0538) Resp:  [17-18] 18  (03/06 0538) BP: (120-127)/(64-72) 122/72 mmHg (03/06 0538) SpO2:  [94 %-100 %] 95 % (03/06 0538) Weight:  [60.1 kg (132 lb 7.9 oz)] 60.1 kg (132 lb 7.9 oz) (03/06 0538)  Intake/Output from previous day: 03/05 0701 - 03/06 0700 In: 1000 [P.O.:1000] Out: 704 [Urine:700; Stool:4] Intake/Output from this shift: Total I/O In: 240 [P.O.:240] Out: 102 [Urine:100; Stool:2]  Physical Exam: Exam is essentially unchanged  Lab Results:  Basename 01/04/12 0647 01/03/12 0455  WBC 6.5 7.7  HGB 11.3* 11.3*  PLT 188 180   No results found for this basename: NA:2,K:2,CL:2,CO2:2,GLUCOSE:2,BUN:2,CREATININE:2 in the last 72 hours No results found for this basename: TROPONINI:2,CK,MB:2 in the last 72 hours Hepatic Function Panel No results found for this basename: PROT,ALBUMIN,AST,ALT,ALKPHOS,BILITOT,BILIDIR,IBILI in the last 72 hours No results found for this basename: CHOL in the last 72 hours No results found for this basename: PROTIME in the last 72 hours  Imaging: Imaging results have been reviewed and No results found.  Cardiac Studies:  Assessment/Plan:  Chronic atrial fibrillation stable Status post pauses secondary to multiple medications Hypertension Hypertensive heart disease with diastolic dysfunction History of CVA GERD Hypothermia Gouty arthritis Plan Her okay to discharge from cardiac point of view followup with Dr. Algie Coffer in 2 weeks  LOS: 4 days    Eila Runyan N 01/04/2012, 1:38 PM

## 2012-01-04 NOTE — Progress Notes (Signed)
   CARE MANAGEMENT NOTE 01/04/2012  Patient:  DAREN, DOSWELL   Account Number:  [id number]  Date Initiated:  01/03/2012  Documentation initiated by:  Pricila Bridge  Subjective/Objective Assessment:   Order for HHPT and HHOT     Action/Plan:   Met with pt re HH needs, and pt declined [facility] services  01/04/2012 Pt agreed to HHPT evaluation.   Anticipated DC Date:  01/04/2012   Anticipated DC Plan:  HOME W HOME HEALTH SERVICES         Choice offered to / List presented to:  C-1 Patient        HH arranged  HH-2 PT      [facility] agency  [facility]   Status of service:  Completed, signed off Medicare Important Message given?   (If response is "NO", the following Medicare IM given date fields will be blank) Date Medicare IM given:   Date Additional Medicare IM given:    Discharge Disposition:  HOME W HOME HEALTH SERVICES  Per UR Regulation:    Comments:  01/04/2012 Pt has agreed to allow HHPT come to her home. Setup with [facility]. Johny Shock RN MPH Case Manager [phone]    01/03/2012 Pt declined HH, due to schedules of pt, daughter and granddaughters. Will ask MD to encourage pt to accept HHPT/OT. Johny Shock RN MPH Case Manager [phone]

## 2012-01-04 NOTE — Progress Notes (Signed)
IM Attending  She is finally ready for discharge:  1.  No further pauses and no cardiac sx.  Dr. Sharyn Lull agrees with med changes to reduce anti-dromotropic effect.  2.  She is sitting up and seems more interactive and vigorous.  Appreciate PT and OT consultants.  She agrees to have one home health assessment visit, especially to recomment any environmental changes for safety.  3.  Please review history of her drug tolerance for interval gout therapy.

## 2012-01-04 NOTE — Progress Notes (Signed)
Subjective: Pt states that she is still having some stool incontinence with urination. She has been up to the chair yesterday. She is feeling okay but weak. Her gout seems to be acting up in her knee and she is still having some burning in and around her rectum but much less today than yesterday. No other complaints, no chest pain, no nausea, no vomiting.   Objective: Vital signs in last 24 hours: Filed Vitals:   01/03/12 0712 01/03/12 1500 01/03/12 2213 01/04/12 0538  BP: 150/93 127/64 120/70 122/72  Pulse: 77 80 87 89  Temp: 98.2 F (36.8 C) 97.6 F (36.4 C) 98.9 F (37.2 C) 98.7 F (37.1 C)  TempSrc: Oral  Oral Oral  Resp: 18 18 17 18   Height:      Weight: 134 lb 14.7 oz (61.2 kg)   132 lb 7.9 oz (60.1 kg)  SpO2: 100% 100% 94% 95%   Weight change: 1 lb 15.7 oz (0.9 kg)  Intake/Output Summary (Last 24 hours) at 01/04/12 0802 Last data filed at 01/04/12 0730  Gross per 24 hour  Intake    940 ml  Output    805 ml  Net    135 ml   Physical Exam: General: resting in bed, lying on her side  HEENT: PERRL, EOMI, no scleral icterus  Cardiac: RRR, no rubs, murmurs or gallops  Pulm: clear to auscultation bilaterally, moving normal volumes of air  Abd: soft, nontender, nondistended, BS present  Ext: warm and well perfused, no pedal edema  Neuro: alert and oriented X3, cranial nerves II-XII grossly intact  Lab Results: Basic Metabolic Panel:  Lab 01/01/12 1610 12/31/11 1345  NA 141 140  K 3.5 3.6  CL 105 100  CO2 23 24  GLUCOSE 96 161*  BUN 21 21  CREATININE 1.36* 1.60*  CALCIUM 8.6 9.8  MG -- --  PHOS -- --   Liver Function Tests:  Lab 01/01/12 0557 12/31/11 1345  AST 30 44*  ALT 13 19  ALKPHOS 118* 140*  BILITOT 1.4* 1.2  PROT 5.9* 7.0  ALBUMIN 2.8* 3.4*   CBC:  Lab 01/04/12 0647 01/03/12 0455 12/31/11 1345  WBC 6.5 7.7 --  NEUTROABS -- -- 3.4  HGB 11.3* 11.3* --  HCT 34.3* 34.4* --  MCV 90.0 90.5 --  PLT 188 180 --    Lab 01/01/12 2119 01/01/12 1707  01/01/12 1250  GLUCAP 124* 134* 174*   Coagulation:  Lab 12/31/11 1345  LABPROT 21.6*  INR 1.84*   Micro Results: Recent Results (from the past 240 hour(s))  MRSA PCR SCREENING     Status: Normal   Collection Time   01/01/12  6:01 PM      Component Value Range Status Comment   MRSA by PCR NEGATIVE  NEGATIVE  Final    Studies/Results: No results found. Medications: I have reviewed the patient's current medications. Scheduled Meds:    . cloNIDine  0.2 mg Oral BID  . digoxin  125 mcg Oral Daily  . docusate sodium  100 mg Oral BID  . hydrocortisone   Rectal TID  . hydrocortisone  25 mg Rectal BID  . latanoprost  1 drop Both Eyes QHS  . metoprolol succinate  200 mg Oral Daily  . pantoprazole  40 mg Oral Q1200  . psyllium  1 packet Oral Daily  . rivaroxaban  15 mg Oral Daily  . DISCONTD: diltiazem  240 mg Oral Daily   Continuous Infusions:  PRN Meds:.acetaminophen, acetaminophen, ondansetron (ZOFRAN)  IV, ondansetron, oxyCODONE-acetaminophen Assessment/Plan:   *GI bleed - possibly due to hemorrhoids, treating with anusol cream and suppository to relieve pain and burning. Pt is doing better and having less rectal burning.   Atrial fibrillation with RVR - did D/C amiodarone yesterday. Continued to have pause overnight. Had Dr. Sharyn Lull see her and appreciated his input. Will not discharge the patient on amiodarone, not on diltiazem, and not on digoxin. Will have her see her cardiologist as an out-patient for further medication adjustment. Has not had a pause overnight. Xarelto is restarted without any problems.   HYPERTENSION - BP is stable on her current regiment with metoprolol, clonidine.   Gout - Pt is in current flare of gout. Will start colchicine 0.6 mg daily and re-address why she is not on something for prevention of flares.   Chronic diastolic heart failure - Not in exacerbation and stable on current regimen with adjustments mentioned above.   Disposition - Today. Will  need home PT OT help but patient is does not want long term therapy so talked her into a one time visit to evaluate the safety in the home with recommendations for safety.    LOS: 4 days   Kim [name] 01/04/2012, 8:02 AM

## 2012-01-04 NOTE — Progress Notes (Signed)
Physical Therapy Treatment Patient Details Name: Kim [name] MRN: [id number] DOB: [birth year] Today's Date: 01/04/2012  PT Assessment/Plan  PT - Assessment/Plan Comments on Treatment Session: Patient admitted for rectal bleed.  Patient did not want to work with PT initially and had to be encouraged to work with PT.  Patient was able to demonstrate that she can transfer safely in enclosed spaces with cane.  For ambulation in room, recommended that patient use a RW instead (she has one at home) for safety.  Patient agrees at this time.  Also discussed with patient why she needs a HHPT safety evaluation and HHOT evaluation and patient finally agreed to receive these services.  It appears that the patient tells every person that comes in to the room a little different story so home situation is hard to determine.  Continue PT.   PT Plan: Discharge plan remains appropriate;Frequency remains appropriate PT Frequency: Min 3X/week Recommendations for Other Services: OT consult Follow Up Recommendations: Home health PT;Supervision/Assistance - 24 hour Equipment Recommended: Toilet rise with handles;Toilet riser;Defer to next venue (CM has verified that she cannot get riser through Medicare) PT Goals  Acute Rehab PT Goals PT Goal Formulation: With patient PT Goal: Supine/Side to Sit - Progress: Progressing toward goal PT Goal: Sit to Stand - Progress: Met PT Transfer Goal: Bed to Chair/Chair to Bed - Progress: Progressing toward goal PT Goal: Ambulate - Progress: Progressing toward goal PT Goal: Perform Home Exercise Program - Progress: Progressing toward goal  PT Treatment Precautions/Restrictions  Precautions Precautions: Fall Required Braces or Orthoses: No Restrictions Weight Bearing Restrictions: No Mobility (including Balance) Bed Mobility Bed Mobility: Yes Rolling Right: 5: Supervision Right Sidelying to Sit: 5: Supervision Transfers Sit to Stand: 5: Supervision;With upper  extremity assist;From bed Sit to Stand Details (indicate cue type and reason): Patient able to stand without help with increased time.  Poor posture as she is kyphotic and has incr weight shift to her right with a rotation of pelvis.   Stand to Sit: 5: Supervision;With upper extremity assist;With armrests;To chair/3-in-1 Stand to Sit Details: No assist needed to control descent into chair. Stand Pivot Transfers: 5: Supervision Stand Pivot Transfer Details (indicate cue type and reason): Patient able to complete stand pivot transfer bed to recliner with supervision and no physical assist.  Steady in enclosed area using cane. Ambulation/Gait Ambulation/Gait: Yes Ambulation/Gait Assistance: 4: Min assist (guard assist) Ambulation/Gait Assistance Details (indicate cue type and reason): Patient had no LOB using cane with ambulation but appears grossly unsteady.  She was able to ambulate in room.  Did not challenge patient but allowed her to ambulate in room.  Poor postural control and stability. Ambulation Distance (Feet): 20 Feet Assistive device: Straight cane Gait Pattern: Step-through pattern;Decreased stride length;Decreased weight shift to left;Antalgic;Lateral trunk lean to right;Trunk flexed Stairs: No Wheelchair Mobility Wheelchair Mobility: No  Posture/Postural Control Posture/Postural Control: Postural limitations Postural Limitations: Flexed at neck, trunk, hips and knees, kyphotic thoracic spine Balance Balance Assessed: No   End of Session PT - End of Session Equipment Utilized During Treatment: Gait belt Activity Tolerance: Patient limited by fatigue;Patient limited by pain Patient left: in chair;with call bell in reach Nurse Communication: Mobility status for transfers;Mobility status for ambulation General Behavior During Session: [facility] for tasks performed Cognition: [facility] for tasks performed  INGOLD,Delvonte Berenson 01/04/2012, 1:52 PM [facility] Acute  Rehabilitation [phone] (pager)

## 2012-01-04 NOTE — Evaluation (Signed)
Occupational Therapy Evaluation Patient Details Name: Kim [name] MRN: [id number] DOB: [birth year] Today's Date: 01/04/2012  Problem List:  Patient Active Problem List  Diagnoses  . THYROID STIMULATING HORMONE, ABNORMAL  . VITAMIN D DEFICIENCY  . HYPERLIPIDEMIA  . GOUTY TOPHI  . HYPOKALEMIA  . ANXIETY  . GLAUCOMA  . HYPERTENSION  . Chronic diastolic heart failure  . PULMONARY FIBROSIS, INTERSTITIAL  . REFLUX, ESOPHAGEAL  . HIATAL HERNIA  . HEMOCCULT POSITIVE STOOL  . RENAL INSUFFICIENCY  . FATIGUE  . WEIGHT LOSS  . Nausea  . Impaired mobility and ADLs  . Atrial fibrillation with RVR  . Supratherapeutic INR  . GI bleed    Past Medical History:  Past Medical History  Diagnosis Date  . HTN (hypertension)   . AF (atrial fibrillation)     On Xarelto as of 11/2011  . Diastolic heart failure     Echo 5/11: There was moderate concentric hypertrophy. Systolic function was vigorous. The best esimated ejection fraction was in the range of 65% to 70%. Wall motion was normal  . Gout   . RA (rheumatoid arthritis)   . Chronic renal insufficiency     Baseline approx: 1.7  . Glaucoma     No recent eye visits noted in EMR.   . Pulmonary artery hypertension     Per 5/11 echo, PA peak pressure: 69mm Hg  . Stroke   . Coronary artery disease    Past Surgical History:  Past Surgical History  Procedure Date  . Cardiac catheterization 4/02     Minimal luminal irregularities without significant focal stenosis.  Marland Kitchen Extracapsular cataract extraction with intraocular lens 11/08    Right eye, Dr. Mitzi Davenport    OT Assessment/Plan/Recommendation OT Assessment Clinical Impression Statement: Patient presents to OT with premorbid limitations with functional mobility and BADL tasks secondary to H/O arthritis and gout to include deformities which further limit her.  Patient would benefit from further OT services to improve functional mobility and ADL tasks however, patient declines further  services.  Patient has finallay agreed to a one time visit from [facility] to asses the safety of her home and  possible need for futrther DME to improve safety.  Unclear of  available assistance from daughter and 2 grandaughters that live with her yet either work or go to school.  Antiicipate that patient wants to go back home so badly that she will tell hospital staff what she thinks they want to hear.   OT Recommendation/Assessment:  (Recommend further OT services however, patient declines.) OT Recommendation Follow Up Recommendations: Supervision/Assistance - 24 hour (Recommend further OT services however, patient declines.) Equipment Recommended: Toilet riser versusToilet rise with handles verses another 3 n 1 over commode to improve safety and independence when patient uses the toilet in her bathroom,  (secondary to patient uses her 3 n 1 at her bedside).  Defer to next venue  OT Evaluation Precautions/Restrictions  Precautions Precautions: Fall Required Braces or Orthoses: No Restrictions Weight Bearing Restrictions: No Prior Functioning Home Living Lives With: Family;Daughter (and granddaughters; only alone about an hour or two a day) Receives Help From: Family Type of Home: House Home Layout: One level Home Access: Ramped entrance Bathroom Shower/Tub: Engineer, manufacturing systems: Standard (needs rails or elevated seat over the standard toilet ) Bathroom Accessibility: Yes How Accessible: Accessible via walker Home Adaptive Equipment: Bedside commode/3-in-1;Tub transfer bench;Walker - four wheeled;Straight cane;Wheelchair - manual;Hand-held shower hose Additional Comments: Recommend 3 n 1 remain at bedside and patient/family obtain  evelated toilet seat and rails to increase safety when patient uses toilet in bathroom. Prior Function Level of Independence: Needs assistance with ADLs Bath: Minimal (help to get out of shower/tub) Toileting: Minimal (help when goes into  bathroom) Dressing: Minimal (shoes and socks only) Driving: No Vocation: Retired Comments: patient states when her arthritis and gout flair up she minimizes the amount of movement and activity she engages in, "I have only been in the kitchen about 5 times this past month".  Difficult to get clear picture of patient prior functional level as it appears patient will report  what she wants [place] to hear so she can get home. ADL ADL Eating/Feeding: Not assessed Grooming: Not assessed Upper Body Bathing: Simulated;Supervision/safety Where Assessed - Upper Body Bathing: Unsupported;Sitting, bed Lower Body Bathing: Simulated;Minimal assistance Where Assessed - Lower Body Bathing: Sitting, bed;Sit to stand from bed Upper Body Dressing: Simulated;Minimal assistance Where Assessed - Upper Body Dressing: Sitting, bed;Unsupported Lower Body Dressing: Performed;Moderate assistance Where Assessed - Lower Body Dressing: Sitting, bed Toilet Transfer: Performed Toilet Transfer Method: Stand pivot;Ambulating Acupuncturist: Programme researcher, broadcasting/film/video Manipulation: Supervision/safety Where Assessed - Toileting Clothing Manipulation: Standing Toileting - Hygiene: Performed;Set up Where Assessed - Toileting Hygiene: Standing;Sit on 3-in-1 or toilet Tub/Shower Transfer: Not assessed Equipment Used: Cane Ambulation Related to ADLs: prefers [employer] and furniture walks. ADL Comments: patient report assistance needed today because of gout and arthritis flair up. Vision/Perception  Vision - History Patient Visual Report: No change from baseline Vision - Assessment Vision Assessment: Vision not tested Cognition Cognition Arousal/Alertness: Awake/alert Overall Cognitive Status: Appears within functional limits for tasks assessed Cognition - Other Comments: Judgement related to safety may be impaired verses patient just wants to be at home and go back to her "routine" regardless of safety  issues. Sensation/Coordination Sensation Light Touch: Appears Intact Stereognosis: Not tested Hot/Cold: Not tested Proprioception: Not tested Coordination Gross Motor Movements are Fluid and Coordinated: Yes Fine Motor Movements are Fluid and Coordinated: Yes Extremity Assessment RUE Assessment RUE Assessment: Exceptions to [facility] RUE Strength RUE Overall Strength: Deficits;Due to premorbid status (rheumatoid arthritis and gout) LUE Assessment LUE Assessment: Exceptions to [facility] LUE Strength LUE Overall Strength: Deficits;Due to premorbid status (due to rheumatoid arthritis and gout) Mobility  Bed Mobility Bed Mobility: Yes Rolling Right: 5: Supervision Right Sidelying to Sit: 5: Supervision Transfers Transfers: Yes Sit to Stand: From bed;5: Supervision;With upper extremity assist;With armrests;From toilet Stand to Sit: 4: Min assist;With upper extremity assist;With armrests;To chair/3-in-1;To toilet End of Session OT - End of Session Equipment Utilized During Treatment: Gait belt Activity Tolerance: Patient limited by pain, not rated, repositioned, increase activity and distraction Patient left: in chair;with call bell in reach;Other (comment) (MD and teaching service present) General Behavior During Session: [facility] for tasks performed Cognition: [facility] for tasks performed   Ndrew Creason 01/04/2012, 10:38 AM

## 2012-01-04 NOTE — Plan of Care (Signed)
Problem: Phase II Progression Outcomes Goal: Progress activity as tolerated unless otherwise ordered Outcome: Progressing With encouragement, patient agreed to out of bed, short walk to sink with SP cane, BSC transfer and finish activity seated in recliner

## 2012-01-05 NOTE — Discharge Summary (Signed)
Internal Medicine Teaching [facility] Discharge Note  Name: Kim [name] MRN: [id number] DOB: 02/23/1929 78 y.o.  Date of Admission: 12/31/2011  1:15 PM Date of Discharge: 01/05/2012 Attending Physician: Dr. Ulyess Mort  Discharge Diagnosis: Principal Problem:  *GI bleed Active Problems:  HYPERTENSION  Chronic diastolic heart failure  Atrial fibrillation with RVR   Discharge Medications: Medication List  As of 01/05/2012  3:44 PM   STOP taking these medications         digoxin 0.125 MG tablet      diltiazem 240 MG 24 hr capsule         TAKE these medications         acetaminophen 325 MG tablet   Commonly known as: TYLENOL   Take 325 mg by mouth every 6 (six) hours as needed. pain      cloNIDine 0.2 MG tablet   Commonly known as: CATAPRES   TAKE ONE TABLET TWICE DAILY      colchicine 0.6 MG tablet   Take 0.6 mg by mouth daily.      DSS 100 MG Caps   Take 100 mg by mouth 2 (two) times daily as needed for constipation (Please stop taking if bowel movements are loose. ).      furosemide 40 MG tablet   Commonly known as: LASIX   Take 40 mg by mouth 2 (two) times daily.      hydrocortisone 2.5 % rectal cream   Commonly known as: ANUSOL-HC   Place rectally 2 (two) times daily.      hydrocortisone 25 MG suppository   Commonly known as: ANUSOL-HC   Place 1 suppository (25 mg total) rectally 2 (two) times daily.      latanoprost 0.005 % ophthalmic solution   Commonly known as: XALATAN   Place 1 drop into both eyes at bedtime. Use as directed      metoprolol 200 MG 24 hr tablet   Commonly known as: TOPROL-XL   Take 1 tablet (200 mg total) by mouth daily.      potassium chloride SA 20 MEQ tablet   Commonly known as: K-DUR,KLOR-CON   Take 40 mEq by mouth 2 (two) times daily.      psyllium 95 % Pack   Commonly known as: HYDROCIL/METAMUCIL   Take 1 packet by mouth daily.      Rivaroxaban 15 MG Tabs tablet   Commonly known as: XARELTO   Take 1 tablet  (15 mg total) by mouth daily.            Disposition and follow-up:   [name] was [name] in Good condition.    Follow-up Appointments: Follow-up Information    Follow up with PRIBULA,CHRISTOPHER, MD .        Discharge Orders    Future Orders Please Complete By Expires   Diet - low sodium heart healthy      Increase activity slowly         Consultations:  Cardiology   Admission HPI:  The patient is an 78 YO woman who came to the ED today for one episode of bright red blood per rectum. She has not had a bowel movement in multiple days and felt she would have one this morning. She was straining to go and was having some abdominal pain and nausea and looked in the bowl and saw red and clot material in the commode and then while wiping was wiping some thick red from  herself. She states that she normally goes about 4-5 days between bowel movements. She is on anti-coagulation therapy with xarelto which she takes once daily. She was switched in January from coumadin due to multiple high and variable INR levels. She has not fallen recently and has not been vomiting. She has not had any episodes like this is the past. She has never had colonoscopy. She does have a history of atrial fibrillation which is why she is anticoagulated.   Hospital Course by problem list:  GI bleed - The patient did come in with rectal bleeding and was put on telemetry overnight. During this time period her CBCs were stable and anti-coagulation was restarted when it was determined the bleed was not significant. It was likely related to hemorrhoids and she was given anusol cream and suppository to be used to calm down some rectal irritation and burning. She did not have any repeat episodes of bleeding during this hospital stay. Please check on clinical symptoms at follow up visit and if any repeat episodes address.   HYPERTENSION - Her blood pressures were slightly  low at admission and some of her medications were held. Please see below problem.   Atrial fibrillation with RVR - The patient does have atrial fibrillation. She did have several pauses of 2-4 secs during this stay and her medications were adjusted. At home she was taking amiodarone, diltiazem, metoprolol. At time of discharge she was 24 hours without a pause on only the metoprolol and she was still rate controlled. Have asked her to see her cardiology Dr. Algie Coffer upon discharge. He was out of town and his partner Dr. Sharyn Lull saw her in the hospital. Her xarelto was continued after it was determined that this bleed was not significant.   Chronic diastolic heart failure - This problem was stable and she was not in acute exacerbation during this hospitalization and she was continued on her metoprolol.   Discharge Vitals:  BP 122/72  Pulse 89  Temp(Src) 98.7 F (37.1 C) (Oral)  Resp 18  Ht 5\' 4"  (1.626 m)  Wt 132 lb 7.9 oz (60.1 kg)  BMI 22.74 kg/m2  SpO2 95%  Discharge Labs:  CBC: WBC 6.5 Hg 11.3 Platelets 188 BMET: Na 141 K 3.5 Cl 105 bicarb 23 BUN 21 Cr 1.36 Glucose 96 Ca 8.6  Signed: KOLLAR, Bemnet Trovato 01/05/2012, 3:44 PM

## 2012-01-25 ENCOUNTER — Other Ambulatory Visit: Payer: Self-pay | Admitting: Internal Medicine

## 2012-01-30 ENCOUNTER — Other Ambulatory Visit: Payer: Self-pay | Admitting: Internal Medicine

## 2012-02-17 ENCOUNTER — Other Ambulatory Visit: Payer: Self-pay | Admitting: Internal Medicine

## 2012-03-03 NOTE — Assessment & Plan Note (Signed)
Lab Results  Component Value Date   NA 141 01/01/2012   K 3.5 01/01/2012   CL 105 01/01/2012   CO2 23 01/01/2012   BUN 21 01/01/2012   CREATININE 1.36* 01/01/2012   CREATININE 1.65* 12/16/2011    BP Readings from Last 3 Encounters:  12/16/11 120/76  11/04/11 160/107    Assessment: Hypertension control:  controlled  Progress toward goals:  at goal Barriers to meeting goals:  no barriers identified  Plan: Hypertension treatment:  continue current medications

## 2012-03-03 NOTE — Assessment & Plan Note (Signed)
She is having more pain from her gouty tophi.  This appears to be a mild flare going but she states that she has not been taking her cholchicine.  We will have her restart this and see if the pain improves.

## 2012-03-03 NOTE — Assessment & Plan Note (Signed)
She has worsening of her swelling over the last few days.  We will have her increase her Lasix and follow up next week in clinic to see how she is doing.

## 2012-03-03 NOTE — Assessment & Plan Note (Signed)
Creat  Date Value Range Status  12/16/2011 1.65* 0.50-1.10 (mg/dL) Final  9/60/4540 9.81* 0.40-1.20 (mg/dL) Final     Creatinine, Ser  Date Value Range Status  11/04/2011 1.65* 0.50-1.10 (mg/dL) Final  11/08/1476 2.95* 0.50-1.10 (mg/dL) Final   Her creatinine is at her baseline.  We will continue to monitor with the increased swelling and subsequent diuresis.

## 2012-03-03 NOTE — Assessment & Plan Note (Signed)
Lab Results  Component Value Date   TSH 2.328 10/11/2011   Her last TSH was normal which is the first time this has been good for the last few years.  She will need to have it rechecked in about 5 months from this last measurement.

## 2012-04-30 ENCOUNTER — Other Ambulatory Visit: Payer: Self-pay | Admitting: *Deleted

## 2012-04-30 MED ORDER — COLCHICINE 0.6 MG PO TABS: 0.6000 mg | ORAL_TABLET | Freq: Every day | ORAL | Status: DC

## 2012-04-30 MED ORDER — POTASSIUM CHLORIDE CRYS ER 20 MEQ PO TBCR: 40.0000 meq | EXTENDED_RELEASE_TABLET | Freq: Two times a day (BID) | ORAL | Status: DC

## 2012-07-19 ENCOUNTER — Other Ambulatory Visit (HOSPITAL_COMMUNITY): Payer: Self-pay | Admitting: Cardiovascular Disease

## 2012-07-19 DIAGNOSIS — R079 Chest pain, unspecified: Secondary | ICD-10-CM

## 2012-07-25 ENCOUNTER — Encounter (HOSPITAL_COMMUNITY)
Admission: RE | Admit: 2012-07-25 | Discharge: 2012-07-25 | Disposition: A | Discharge status: Home / Self Care | Payer: Medicare Other | Source: Ambulatory Visit | Attending: Cardiovascular Disease | Admitting: Cardiovascular Disease

## 2012-07-25 ENCOUNTER — Other Ambulatory Visit: Payer: Self-pay

## 2012-07-25 ENCOUNTER — Other Ambulatory Visit (HOSPITAL_COMMUNITY): Payer: Medicare Other

## 2012-07-25 DIAGNOSIS — R079 Chest pain, unspecified: Secondary | ICD-10-CM

## 2012-07-25 MED ORDER — METOPROLOL TARTRATE 1 MG/ML IV SOLN
INTRAVENOUS | Status: AC
  Administered 2012-07-25: 5 mg via INTRAVENOUS
  Filled 2012-07-25: qty 5

## 2012-07-25 MED ORDER — METOPROLOL TARTRATE 1 MG/ML IV SOLN
5.0000 mg | Freq: Once | INTRAVENOUS | Status: AC
  Administered 2012-07-25: 5 mg via INTRAVENOUS

## 2012-07-25 MED ORDER — REGADENOSON 0.4 MG/5ML IV SOLN
INTRAVENOUS | Status: AC
  Administered 2012-07-25: 0.4 mg
  Filled 2012-07-25: qty 5

## 2012-07-25 MED ORDER — REGADENOSON 0.4 MG/5ML IV SOLN: 0.4000 mg | Freq: Once | INTRAVENOUS | Status: DC

## 2012-07-25 MED ORDER — TECHNETIUM TC 99M SESTAMIBI - CARDIOLITE
10.0000 | Freq: Once | INTRAVENOUS | Status: AC | PRN
  Administered 2012-07-25: 11:00:00 10 via INTRAVENOUS

## 2012-07-25 MED ORDER — TECHNETIUM TC 99M SESTAMIBI - CARDIOLITE
30.0000 | Freq: Once | INTRAVENOUS | Status: AC | PRN
  Administered 2012-07-25: 12:00:00 30 via INTRAVENOUS

## 2012-07-25 MED ORDER — SODIUM CHLORIDE 0.9 % IV SOLN
INTRAVENOUS | Status: DC
  Administered 2012-07-25: 750 mL via INTRAVENOUS

## 2012-07-25 MED ORDER — SODIUM CHLORIDE 0.9 % IV BOLUS (SEPSIS)
250.0000 mL | Freq: Once | INTRAVENOUS | Status: AC
  Administered 2012-07-25: 250 mL via INTRAVENOUS

## 2012-07-26 ENCOUNTER — Other Ambulatory Visit: Payer: Self-pay | Admitting: *Deleted

## 2012-07-26 MED ORDER — METOPROLOL SUCCINATE ER 200 MG PO TB24: 200.0000 mg | ORAL_TABLET | Freq: Every day | ORAL | Status: DC

## 2012-07-26 MED ORDER — RIVAROXABAN 15 MG PO TABS: 15.0000 mg | ORAL_TABLET | Freq: Every day | ORAL | Status: DC

## 2012-07-26 MED ORDER — COLCHICINE 0.6 MG PO TABS: 0.6000 mg | ORAL_TABLET | Freq: Every day | ORAL | Status: DC

## 2012-09-21 ENCOUNTER — Encounter (HOSPITAL_COMMUNITY): Payer: Self-pay | Admitting: Emergency Medicine

## 2012-09-21 ENCOUNTER — Observation Stay (HOSPITAL_COMMUNITY)
Admission: EM | Admit: 2012-09-21 | Discharge: 2012-09-22 | Disposition: A | Discharge status: Home / Self Care | Payer: Medicare Other | Attending: Internal Medicine | Admitting: Internal Medicine

## 2012-09-21 ENCOUNTER — Emergency Department (HOSPITAL_COMMUNITY): Payer: Medicare Other

## 2012-09-21 DIAGNOSIS — R600 Localized edema: Secondary | ICD-10-CM | POA: Diagnosis present

## 2012-09-21 DIAGNOSIS — I2510 Atherosclerotic heart disease of native coronary artery without angina pectoris: Secondary | ICD-10-CM | POA: Insufficient documentation

## 2012-09-21 DIAGNOSIS — Z8673 Personal history of transient ischemic attack (TIA), and cerebral infarction without residual deficits: Secondary | ICD-10-CM | POA: Insufficient documentation

## 2012-09-21 DIAGNOSIS — I4891 Unspecified atrial fibrillation: Secondary | ICD-10-CM

## 2012-09-21 DIAGNOSIS — M069 Rheumatoid arthritis, unspecified: Secondary | ICD-10-CM | POA: Insufficient documentation

## 2012-09-21 DIAGNOSIS — I5032 Chronic diastolic (congestive) heart failure: Secondary | ICD-10-CM

## 2012-09-21 DIAGNOSIS — Z79899 Other long term (current) drug therapy: Secondary | ICD-10-CM | POA: Insufficient documentation

## 2012-09-21 DIAGNOSIS — M109 Gout, unspecified: Secondary | ICD-10-CM

## 2012-09-21 DIAGNOSIS — R609 Edema, unspecified: Secondary | ICD-10-CM

## 2012-09-21 DIAGNOSIS — R079 Chest pain, unspecified: Secondary | ICD-10-CM

## 2012-09-21 DIAGNOSIS — I509 Heart failure, unspecified: Secondary | ICD-10-CM | POA: Insufficient documentation

## 2012-09-21 DIAGNOSIS — M7989 Other specified soft tissue disorders: Secondary | ICD-10-CM

## 2012-09-21 DIAGNOSIS — R0789 Other chest pain: Principal | ICD-10-CM | POA: Insufficient documentation

## 2012-09-21 DIAGNOSIS — N189 Chronic kidney disease, unspecified: Secondary | ICD-10-CM | POA: Insufficient documentation

## 2012-09-21 DIAGNOSIS — I129 Hypertensive chronic kidney disease with stage 1 through stage 4 chronic kidney disease, or unspecified chronic kidney disease: Secondary | ICD-10-CM | POA: Insufficient documentation

## 2012-09-21 DIAGNOSIS — M1A9XX1 Chronic gout, unspecified, with tophus (tophi): Secondary | ICD-10-CM

## 2012-09-21 LAB — CBC WITH DIFFERENTIAL/PLATELET
Eosinophils Absolute: 0.1 10*3/uL (ref 0.0–0.7)
Eosinophils Relative: 2 % (ref 0–5)
HCT: 35.7 % — ABNORMAL LOW (ref 36.0–46.0)
Lymphocytes Relative: 47 % — ABNORMAL HIGH (ref 12–46)
Lymphs Abs: 2.2 10*3/uL (ref 0.7–4.0)
MCH: 30.9 pg (ref 26.0–34.0)
MCV: 92.7 fL (ref 78.0–100.0)
Monocytes Absolute: 0.6 10*3/uL (ref 0.1–1.0)
Monocytes Relative: 12 % (ref 3–12)
RBC: 3.85 MIL/uL — ABNORMAL LOW (ref 3.87–5.11)
WBC: 4.8 10*3/uL (ref 4.0–10.5)

## 2012-09-21 LAB — BASIC METABOLIC PANEL
CO2: 23 mEq/L (ref 19–32)
Calcium: 9.4 mg/dL (ref 8.4–10.5)
Chloride: 96 mEq/L (ref 96–112)
Glucose, Bld: 133 mg/dL — ABNORMAL HIGH (ref 70–99)
Sodium: 137 mEq/L (ref 135–145)

## 2012-09-21 MED ORDER — LATANOPROST 0.005 % OP SOLN
1.0000 [drp] | Freq: Every day | OPHTHALMIC | Status: DC
  Administered 2012-09-21: 1 [drp] via OPHTHALMIC
  Filled 2012-09-21: qty 2.5

## 2012-09-21 MED ORDER — HYDROCODONE-ACETAMINOPHEN 5-325 MG PO TABS
2.0000 | ORAL_TABLET | Freq: Once | ORAL | Status: AC
  Administered 2012-09-21: 2 via ORAL
  Filled 2012-09-21: qty 2

## 2012-09-21 MED ORDER — ONDANSETRON HCL 4 MG/2ML IJ SOLN: 4.0000 mg | Freq: Four times a day (QID) | INTRAMUSCULAR | Status: DC | PRN

## 2012-09-21 MED ORDER — SODIUM CHLORIDE 0.9 % IV SOLN: 250.0000 mL | INTRAVENOUS | Status: DC | PRN

## 2012-09-21 MED ORDER — ONDANSETRON 4 MG PO TBDP: 4.0000 mg | ORAL_TABLET | Freq: Three times a day (TID) | ORAL | Status: DC | PRN

## 2012-09-21 MED ORDER — SODIUM CHLORIDE 0.9 % IJ SOLN
3.0000 mL | Freq: Two times a day (BID) | INTRAMUSCULAR | Status: DC
  Administered 2012-09-22: 3 mL via INTRAVENOUS

## 2012-09-21 MED ORDER — ACETAMINOPHEN 325 MG PO TABS
650.0000 mg | ORAL_TABLET | Freq: Four times a day (QID) | ORAL | Status: DC | PRN
  Administered 2012-09-22: 650 mg via ORAL
  Filled 2012-09-21: qty 2

## 2012-09-21 MED ORDER — SODIUM CHLORIDE 0.9 % IJ SOLN: 3.0000 mL | INTRAMUSCULAR | Status: DC | PRN

## 2012-09-21 MED ORDER — COLCHICINE 0.6 MG PO TABS
0.6000 mg | ORAL_TABLET | Freq: Every day | ORAL | Status: DC
  Administered 2012-09-22: 0.6 mg via ORAL
  Filled 2012-09-21 (×2): qty 1

## 2012-09-21 MED ORDER — ACETAMINOPHEN 325 MG PO TABS
650.0000 mg | ORAL_TABLET | Freq: Once | ORAL | Status: AC
  Administered 2012-09-21: 650 mg via ORAL
  Filled 2012-09-21: qty 2

## 2012-09-21 MED ORDER — HYDROCODONE-ACETAMINOPHEN 5-325 MG PO TABS: 2.0000 | ORAL_TABLET | ORAL | Status: DC | PRN

## 2012-09-21 MED ORDER — HYDROCODONE-ACETAMINOPHEN 5-325 MG PO TABS
1.0000 | ORAL_TABLET | Freq: Four times a day (QID) | ORAL | Status: DC | PRN
Start: 2012-09-21 — End: 2012-09-22
  Administered 2012-09-21: 1 via ORAL
  Filled 2012-09-21: qty 1

## 2012-09-21 MED ORDER — METOPROLOL SUCCINATE ER 100 MG PO TB24
200.0000 mg | ORAL_TABLET | Freq: Every day | ORAL | Status: DC
  Administered 2012-09-22: 200 mg via ORAL
  Filled 2012-09-21 (×2): qty 2

## 2012-09-21 MED ORDER — CLONIDINE HCL 0.2 MG PO TABS
0.2000 mg | ORAL_TABLET | Freq: Two times a day (BID) | ORAL | Status: DC
  Administered 2012-09-21 – 2012-09-22 (×2): 0.2 mg via ORAL
  Filled 2012-09-21 (×3): qty 1

## 2012-09-21 MED ORDER — ONDANSETRON HCL 4 MG PO TABS: 4.0000 mg | ORAL_TABLET | Freq: Four times a day (QID) | ORAL | Status: DC | PRN

## 2012-09-21 MED ORDER — RIVAROXABAN 15 MG PO TABS
15.0000 mg | ORAL_TABLET | Freq: Every day | ORAL | Status: DC
  Filled 2012-09-21: qty 1

## 2012-09-21 MED ORDER — POTASSIUM CHLORIDE CRYS ER 20 MEQ PO TBCR
40.0000 meq | EXTENDED_RELEASE_TABLET | Freq: Two times a day (BID) | ORAL | Status: DC
  Administered 2012-09-21 – 2012-09-22 (×2): 40 meq via ORAL
  Filled 2012-09-21 (×3): qty 2

## 2012-09-21 MED ORDER — SODIUM CHLORIDE 0.9 % IJ SOLN: 3.0000 mL | Freq: Two times a day (BID) | INTRAMUSCULAR | Status: DC

## 2012-09-21 MED ORDER — PREDNISONE 20 MG PO TABS
40.0000 mg | ORAL_TABLET | Freq: Every day | ORAL | Status: DC
  Administered 2012-09-22: 40 mg via ORAL
  Filled 2012-09-21 (×2): qty 2

## 2012-09-21 MED ORDER — ONDANSETRON 4 MG PO TBDP
4.0000 mg | ORAL_TABLET | Freq: Once | ORAL | Status: AC
  Administered 2012-09-21: 4 mg via ORAL
  Filled 2012-09-21: qty 1

## 2012-09-21 MED ORDER — ACETAMINOPHEN 650 MG RE SUPP: 650.0000 mg | Freq: Four times a day (QID) | RECTAL | Status: DC | PRN

## 2012-09-21 NOTE — ED Provider Notes (Signed)
History     CSN: [id number]  Arrival date & time 09/21/12  1043   First MD Initiated Contact with Patient 09/21/12 1103      Chief Complaint  Patient presents with  . Chest Pain    (Consider location/radiation/quality/duration/timing/severity/associated sxs/prior treatment) HPI Comments: This is an 78 year old female, who presents to the emergency department with chief complaint of chest pain sense last night at 9 PM. Patient states that the pain has persisted until this morning. She states the one she arrived to the hospital, her pain went away. She has not taken anything for her pain. She reports associated headache, shortness of breath, and nausea. She denies recent travel, recent surgery, history of blood clots. The chest pain does not get worse or better with position. The chest pain came on at rest. She has experienced this chest pain before. She tells me that she had a stress test in September which she says was normal. Additionally, she complains of knee pain secondary to gout in her left knee.  The history is provided by the patient. No language interpreter was used.    Past Medical History  Diagnosis Date  . HTN (hypertension)   . AF (atrial fibrillation)     On Xarelto as of 11/2011  . Diastolic heart failure     Echo 5/11: There was moderate concentric hypertrophy. Systolic function was vigorous. The best esimated ejection fraction was in the range of 65% to 70%. Wall motion was normal  . Gout   . RA (rheumatoid arthritis)   . Chronic renal insufficiency     Baseline approx: 1.7  . Glaucoma(365)     No recent eye visits noted in EMR.   . Pulmonary artery hypertension     Per 5/11 echo, PA peak pressure: 69mm Hg  . Stroke   . Coronary artery disease     Past Surgical History  Procedure Date  . Cardiac catheterization 4/02     Minimal luminal irregularities without significant focal stenosis.  Marland Kitchen Extracapsular cataract extraction with intraocular lens 11/08   Right eye, Dr. Mitzi Davenport    Family History  Problem Relation Age of Onset  . Diabetes Mother   . Heart disease Father 4    Currently deceased  . Stroke Sister 90  . Stroke Mother 83    History  Substance Use Topics  . Smoking status: Former Smoker    Types: Cigarettes    Quit date: 10/11/1971  . Smokeless tobacco: Not on file  . Alcohol Use: No    OB History    Grav Para Term Preterm Abortions TAB SAB Ect Mult Living                  Review of Systems  All other systems reviewed and are negative.    Allergies  Review of patient's allergies indicates no known allergies.  Home Medications   Current Outpatient Rx  Name  Route  Sig  Dispense  Refill  . ACETAMINOPHEN 325 MG PO TABS   Oral   Take 325 mg by mouth every 6 (six) hours as needed. pain         . CLONIDINE HCL 0.2 MG PO TABS   Oral   Take 0.2 mg by mouth 2 (two) times daily.         . COLCHICINE 0.6 MG PO TABS   Oral   Take 0.6 mg by mouth daily.         . FUROSEMIDE 40 MG PO  TABS   Oral   Take 40 mg by mouth 2 (two) times daily.         Marland Kitchen LATANOPROST 0.005 % OP SOLN   Both Eyes   Place 1 drop into both eyes at bedtime.         Marland Kitchen METOPROLOL SUCCINATE ER 200 MG PO TB24   Oral   Take 200 mg by mouth daily.         Marland Kitchen POTASSIUM CHLORIDE CRYS ER 20 MEQ PO TBCR   Oral   Take 40 mEq by mouth 2 (two) times daily.         Marland Kitchen RIVAROXABAN 15 MG PO TABS   Oral   Take 15 mg by mouth daily.         Marland Kitchen LATANOPROST 0.005 % OP SOLN   Both Eyes   Place 1 drop into both eyes at bedtime. Use as directed           BP 124/76  Pulse 72  Temp 97.8 F (36.6 C) (Oral)  Resp 18  SpO2 100%  Physical Exam  Nursing note and vitals reviewed. Constitutional: She is oriented to person, place, and time. She appears well-developed and well-nourished.  HENT:  Head: Normocephalic and atraumatic.  Eyes: Conjunctivae normal and EOM are normal. Pupils are equal, round, and reactive to light.    Neck: Normal range of motion. Neck supple.  Cardiovascular: Normal rate.  Exam reveals no friction rub.   No murmur heard.      A-flutter  Pulmonary/Chest: Effort normal and breath sounds normal. No respiratory distress. She has no wheezes. She has no rales. She exhibits no tenderness.  Abdominal: Soft. Bowel sounds are normal. She exhibits no distension and no mass. There is no tenderness. There is no rebound and no guarding.  Musculoskeletal: Normal range of motion.  Neurological: She is alert and oriented to person, place, and time.  Skin: Skin is warm and dry.  Psychiatric: She has a normal mood and affect. Her behavior is normal. Judgment and thought content normal.    ED Course  Procedures (including critical care time)  Results for orders placed during the hospital encounter of 09/21/12  BASIC METABOLIC PANEL      Component Value Range   Sodium 137  135 - 145 mEq/L   Potassium 4.2  3.5 - 5.1 mEq/L   Chloride 96  96 - 112 mEq/L   CO2 23  19 - 32 mEq/L   Glucose, Bld 133 (*) 70 - 99 mg/dL   BUN 26 (*) 6 - 23 mg/dL   Creatinine, Ser 8.29 (*) 0.50 - 1.10 mg/dL   Calcium 9.4  8.4 - 56.2 mg/dL   GFR calc non Af Amer 22 (*) >90 mL/min   GFR calc Af Amer 26 (*) >90 mL/min  CBC WITH DIFFERENTIAL      Component Value Range   WBC 4.8  4.0 - 10.5 K/uL   RBC 3.85 (*) 3.87 - 5.11 MIL/uL   Hemoglobin 11.9 (*) 12.0 - 15.0 g/dL   HCT 13.0 (*) 86.5 - 78.4 %   MCV 92.7  78.0 - 100.0 fL   MCH 30.9  26.0 - 34.0 pg   MCHC 33.3  30.0 - 36.0 g/dL   RDW 69.6  29.5 - 28.4 %   Platelets 306  150 - 400 K/uL   Neutrophils Relative 39 (*) 43 - 77 %   Neutro Abs 1.8  1.7 - 7.7 K/uL   Lymphocytes Relative 47 (*)  12 - 46 %   Lymphs Abs 2.2  0.7 - 4.0 K/uL   Monocytes Relative 12  3 - 12 %   Monocytes Absolute 0.6  0.1 - 1.0 K/uL   Eosinophils Relative 2  0 - 5 %   Eosinophils Absolute 0.1  0.0 - 0.7 K/uL   Basophils Relative 1  0 - 1 %   Basophils Absolute 0.0  0.0 - 0.1 K/uL  POCT I-STAT  TROPONIN I      Component Value Range   Troponin i, poc 0.01  0.00 - 0.08 ng/mL   Comment 3            Dg Chest 2 View  09/21/2012  *RADIOLOGY REPORT*  Clinical Data: Chest pain and shortness of breath.  CHEST - 2 VIEW  Comparison: 11/02/2011  Findings: Stable chronic lung disease.  No edema, infiltrate or pleural fluid identified.  Heart is mildly enlarged.  No evidence of pneumothorax.  Bony structures are unremarkable.  IMPRESSION: Stable chronic lung disease.  No acute findings.   Original Report Authenticated By: Irish Lack, M.D.       ED ECG REPORT  I personally interpreted this EKG   Date: 09/21/2012   Rate:68  Rhythm: atrial flutter  QRS Axis: normal  Intervals: *  ST/T Wave abnormalities: normal  Conduction Disutrbances:3:1 AV block  Narrative Interpretation:   Old EKG Reviewed: Slower ventricular rate    1. Chest pain       MDM  78 year old female with chest pain.  Workup today has been unremarkable.  I have consulted Resident's Teaching Service, who will come and evaluate the patient in the ED.  Patient's pain has been managed with hydrocodone.  3:09 PM Resident's teaching service states that the patient may go home and follow-up with them next week.  I am going to discharge the patient with some pain medicine and zofran.  The patient is stable and ready for discharge.  3:47 PM Spoke with Resident's teaching service.  They recommend [place] to r/o DVT of left lower extremity.  I am going to move the patient to the CDU.  Patient has been discussed with PA Mathersbaugh, who will continue care at this time.  3:50 PM Received phone call from Resident's Teaching Service again, they are going to admit the patient to obs.        Roxy Horseman, PA-C 09/21/12 1520  Roxy Horseman, PA-C 09/21/12 1548  Roxy Horseman, PA-C 09/21/12 [phone]

## 2012-09-21 NOTE — ED Notes (Signed)
EDP at bedside  

## 2012-09-21 NOTE — ED Notes (Signed)
MD at bedside. 

## 2012-09-21 NOTE — ED Notes (Signed)
Pt c/o CP that started this morning around 9p last night. Pt stated that it is not hurting right at this time. Last night pain started in the center of chest and radiated to both shoulders and bil breast. Also c/o dizziness, nausea, and SOB that lasted all night off and on. Currently denies CP, N/V, and SOB. Pt also stated that she had a headache last night as well but does not have one at this time.

## 2012-09-21 NOTE — ED Provider Notes (Signed)
Medical screening examination/treatment/procedure(s) were conducted as a shared visit with non-physician practitioner(s) and myself.  I personally evaluated the patient during the encounter  Lailoni Baquera, MD 09/21/12 1557 

## 2012-09-21 NOTE — ED Provider Notes (Signed)
Medical screening examination/treatment/procedure(s) were conducted as a shared visit with non-physician practitioner(s) and myself.  I personally evaluated the patient during the encounter C/o cp that radiates to right side with sob. sxs started last night. On and off.  Resolved now.   Will check labs and ecg for eval.     Cheri Guppy, MD 09/21/12 1423

## 2012-09-21 NOTE — ED Provider Notes (Signed)
Pt transferred to the CDU to await duplex scan of the left lower extremity for question of DVT.  Teaching service contacted Roxy Horseman, PA-C state they're going to admit the patient.  Patient questioning the need for admission at this time.  She states last she was informed she was going to be with home and had an appointment for next Tuesday at 3:45.  I will attempt to contact the resident teaching service to help her clarify the admission.  I discussed this patient with the resident teaching service, Dr. Heloise Beecham who states that we are admitting for observation overnight for full cycle troponins.  Left lower extremity Doppler without DVT, positive for superficial thrombosis.  I discussed this with the patient and she is amenable to the plan.  Dahlia Client Maylynn Orzechowski, PA-C 09/21/12 2234

## 2012-09-21 NOTE — H&P (Signed)
Hospital Admission Note Date: 09/21/2012  Patient name: Kim [name] Medical record number: [id number] Date of birth: July 10, 1929 Age: 78 y.o. Gender: female PCP: PRIBULA,CHRISTOPHER, MD  Medical Service: [facility] Attending physician:     1st Contact: Dr. Heloise Beecham   Pager: [phone] 2nd Contact: Dr. Bosie Clos   Pager: [phone] After 5 pm or weekends: 1st Contact:  Intern on call   Pager: [phone] 2nd Contact:  Resident on call  Pager: [phone]  Chief Complaint: Chest pain/knee pain  History of Present Illness: [name] is an 78 yo female w pmh of gout, chronic renal insufficiency, h/o CVA, chronic diastolic heart failure 2/2 hypertensive cardiomyopathy, pulmonary artery hypertension, and rate-controlled chronic atrial fibrillation presenting to the ED with a chief complaint of chest pain.  Pt describes that last night around 9pm, she first noticed a sharp pain in the center of her chest while sitting on her bed. She says that she felt the pain start over the center of her chest and spread out below both breasts and into her right shoulder. She also felt some tingling in her left hand. She said that the discomfort lasted all throughout the night and cannot identify any alleviating or aggravating factors. She says that she has had similar chest pain in the past. Most recently worked up in September of this year with Celine Ahr that was normal. Prior heart cath in 2002 did not show any vessel stenoses. Denies any SOB, presyncopal symptoms.  Patient's pain had remitted by the time ambulance arrived to the ED, however, she did start to complain of L knee pain. She has a history of gout and says that she has had gout flares in that L knee in the past. Says she is having exquisite pain, limitation in moving knee secondary to pain. Takes colchicine at home, says she has been unable to tolerate allopurinol in the past. On probenecid previously but d/c'd secondary renal disease. Denies fever, chills,  n/v, swelling.  Pt also says she has had worsening swelling of L lower extremity over the past few months. The left side swelling has been worse than the right side. She says the swelling is not painful. She has been taking her lasix and xarelto regularly.     Meds: Current Outpatient Rx  Name  Route  Sig  Dispense  Refill  . ACETAMINOPHEN 325 MG PO TABS   Oral   Take 325 mg by mouth every 6 (six) hours as needed. pain         . CLONIDINE HCL 0.2 MG PO TABS   Oral   Take 0.2 mg by mouth 2 (two) times daily.         . COLCHICINE 0.6 MG PO TABS   Oral   Take 0.6 mg by mouth daily.         . FUROSEMIDE 40 MG PO TABS   Oral   Take 40 mg by mouth 2 (two) times daily.         Marland Kitchen LATANOPROST 0.005 % OP SOLN   Both Eyes   Place 1 drop into both eyes at bedtime.         Marland Kitchen METOPROLOL SUCCINATE ER 200 MG PO TB24   Oral   Take 200 mg by mouth daily.         Marland Kitchen POTASSIUM CHLORIDE CRYS ER 20 MEQ PO TBCR   Oral   Take 40 mEq by mouth 2 (two) times daily.         Marland Kitchen RIVAROXABAN  15 MG PO TABS   Oral   Take 15 mg by mouth daily.         Marland Kitchen HYDROCODONE-ACETAMINOPHEN 5-325 MG PO TABS   Oral   Take 2 tablets by mouth every 4 (four) hours as needed for pain.   6 tablet   0   . LATANOPROST 0.005 % OP SOLN   Both Eyes   Place 1 drop into both eyes at bedtime. Use as directed         . ONDANSETRON 4 MG PO TBDP   Oral   Take 1 tablet (4 mg total) by mouth every 8 (eight) hours as needed for nausea.   10 tablet   0     Allergies: Allergies as of 09/21/2012  . (No Known Allergies)   Past Medical History  Diagnosis Date  . HTN (hypertension)   . AF (atrial fibrillation)     On Xarelto as of 11/2011  . Diastolic heart failure     Echo 5/11: There was moderate concentric hypertrophy. Systolic function was vigorous. The best esimated ejection fraction was in the range of 65% to 70%. Wall motion was normal  . Gout   . RA (rheumatoid arthritis)   . Chronic renal  insufficiency     Baseline approx: 1.7  . Glaucoma(365)     No recent eye visits noted in EMR.   . Pulmonary artery hypertension     Per 5/11 echo, PA peak pressure: 69mm Hg  . Stroke   . Coronary artery disease    Past Surgical History  Procedure Date  . Cardiac catheterization 4/02     Minimal luminal irregularities without significant focal stenosis.  Marland Kitchen Extracapsular cataract extraction with intraocular lens 11/08    Right eye, Dr. Mitzi Davenport   Family History  Problem Relation Age of Onset  . Diabetes Mother   . Heart disease Father 39    Currently deceased  . Stroke Sister 70  . Stroke Mother 84   History   Social History  . Marital Status: Widowed    Spouse Name: N/A    Number of Children: N/A  . Years of Education: N/A   Occupational History  . 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    Social History Main Topics  . Smoking status: Former Smoker    Types: Cigarettes    Quit date: 10/11/1971  . Smokeless tobacco: Not on file  . Alcohol Use: No  . Drug Use: No  . Sexually Active: No   Other Topics Concern  . Not on file   Social History Narrative   Financial assistance approved for 25% discount after Medicare pays for [facility] only, not eligible for [facility] card.Has [place] health care, medicare.  Currently lives with her daughter and her 2 granddaughters. Has 3 children in town which she is close to and is able to rely on if needed.  High school education. She normally lives alone and can ambulate with a walker. She normally walks herself to the bathroom and perform basic ADLs.    Review of Systems: 10 pt ROS performed, pertinent positives and negatives noted in HPI  Physical Exam: Blood pressure 129/45, pulse 68, temperature 97.8 F (36.6 C), temperature source Oral, resp. rate 17, SpO2 100.00%. Vitals reviewed. General: resting in bed, NAD HEENT: PERRL, EOMI, no scleral icterus Cardiac: irregularly irregular rhythm w rate in 70s, no rubs, murmurs  or gallops Pulm: clear to auscultation bilaterally, no wheezes,  rales, or rhonchi Abd: soft, nontender, nondistended, BS present Ext: bilateral small knee effusions. L knee TTP, limited ROM 2/2 pain, no appreciable warmth or erythema. LLE with 3+ pitting edema of foot to mid-shin, erythematous, chronic stasis changes over skin. Homan's sign is negative. Calf squeeze does not illicit pain. Both extremities are warm and well perfused.  Neuro: alert and oriented X3, cranial nerves II-XII grossly intact, strength and sensation to light touch equal in bilateral upper and lower extremities   Lab results: Basic Metabolic Panel:  Basename 09/21/12 1059  NA 137  K 4.2  CL 96  CO2 23  GLUCOSE 133*  BUN 26*  CREATININE 1.99*  CALCIUM 9.4  MG --  PHOS --   CBC:  Basename 09/21/12 1059  WBC 4.8  NEUTROABS 1.8  HGB 11.9*  HCT 35.7*  MCV 92.7  PLT 306   Lab Results  Component Value Date   CHOL 121 10/11/2011   HDL 37* 10/11/2011   LDLCALC 58 10/11/2011   TRIG 130 10/11/2011   CHOLHDL 3.3 10/11/2011      Imaging results:  Dg Chest 2 View  09/21/2012  *RADIOLOGY REPORT*  Clinical Data: Chest pain and shortness of breath.  CHEST - 2 VIEW  Comparison: 11/02/2011  Findings: Stable chronic lung disease.  No edema, infiltrate or pleural fluid identified.  Heart is mildly enlarged.  No evidence of pneumothorax.  Bony structures are unremarkable.  IMPRESSION: Stable chronic lung disease.  No acute findings.   Original Report Authenticated By: Irish Lack, M.D.     EKG  Date: 09/21/2012  EKG Time:   Rate: 68  Rhythm: atrial fibrillation,  unchanged from previous tracings  Axis: normal  Intervals:normal QTc  ST&T Change: none  Narrative Interpretation: Atrial fibrillation with rate 68, similar to prior tracings  Assessment & Plan by Problem:  *Chest pain Had resolved at time of admission. Frequent complaints of the same. EKG shows rate-controlled atrial fibrillation. Troponin  negative x1. Previous lipid panel w LDL at goal. Clean cath in 2002. Myoview 07/25/12 w no evidence of ischemia or infarction and EF 79%. TIMI score is 1-2. - Continue metoprolol, hold ASA (Xarelto, ? GI bleed in past --> likely hemorrhoidal) - Will admit for obs, cycle 2 more troponins - Repeat ECG in am - Hold off on lipid panel/further risk stratification as pt followed w lab work at [facility] and has appt Monday w Dr. Algie Coffer   Chronic diastolic heart failure Appears stable. Myoview w EF 79% as above. CXR without venous congestion, lungs clear. No evidence exacerbation - Will hold lasix today in setting of gouty flare   Gout flare Pt w L knee pain since arriving to hospital. Pt describes pain as typical of gouty flare.  No trauma, no appreciable warmth. Small effusion comparable to contralateral knee. Some pain with movement. No fever, leukocytosis, or hypotension. Do not suspect septic joint at this time.  - Hold lasix today - Continue colchicine, provide 5 day steroid burst as pt cannot tolerate NSAIDs 2/2 chronic renal failure - F/u in [facility] 11/26 w Dr. Dorthula Rue   Edema of left lower extremity  Patient describes chronic worsening of LLE swelling, greater than right. Lower leg below knee is not painful. Doppler of LLE shows no evidence of DVT, superficial thrombosis or Baker's cyst. Likely edema 2/2 venous insufficiency w diastolic dysfunction contributing.  - Hold lasix today in setting of gout flare  Atrial fibrillation, rate controlled  Rate controlled at this time. Previous EKGs  show the same. - Continue metoprolol, xarelto   Dispo: Disposition is deferred at this time, awaiting improvement of current medical problems. Anticipated discharge in approximately 1 day(s).   The patient does have a current PCP (PRIBULA,CHRISTOPHER, MD), therefore will be requiring [facility]PC follow-up after discharge.   The patient does not have transportation limitations that hinder transportation to clinic  appointments.  Signed: Bronson Curb 09/21/2012, 4:16 PM

## 2012-09-21 NOTE — Progress Notes (Signed)
VASCULAR LAB PRELIMINARY  PRELIMINARY  PRELIMINARY  PRELIMINARY  Left lower extremity venous duplex  completed.    Preliminary report:  Left:  No evidence of DVT, superficial thrombosis, or Baker's cyst.   Teckla Christiansen, RVT 09/21/2012, 4:32 PM

## 2012-09-22 LAB — CBC
HCT: 32.6 % — ABNORMAL LOW (ref 36.0–46.0)
Hemoglobin: 10.9 g/dL — ABNORMAL LOW (ref 12.0–15.0)
MCH: 30.4 pg (ref 26.0–34.0)
RBC: 3.59 MIL/uL — ABNORMAL LOW (ref 3.87–5.11)

## 2012-09-22 LAB — BASIC METABOLIC PANEL
CO2: 26 mEq/L (ref 19–32)
Chloride: 99 mEq/L (ref 96–112)
GFR calc non Af Amer: 24 mL/min — ABNORMAL LOW (ref >90)
Glucose, Bld: 95 mg/dL (ref 70–99)
Potassium: 4.2 mEq/L (ref 3.5–5.1)
Sodium: 138 mEq/L (ref 135–145)

## 2012-09-22 MED ORDER — PREDNISONE 20 MG PO TABS: 40.0000 mg | ORAL_TABLET | Freq: Every day | ORAL | Status: DC

## 2012-09-22 MED ORDER — OMEPRAZOLE 20 MG PO CPDR: 20.0000 mg | DELAYED_RELEASE_CAPSULE | Freq: Every day | ORAL | Status: DC

## 2012-09-22 NOTE — Progress Notes (Signed)
Subjective: No CP or SOB overnight. CE negative x3.  Earlier this morning when I examined pt, complaining of significant pain of L foot and ankle, no longer with L knee pain. L foot remains swollen, venous dopplers yesterday negative for clot. Recommended PT and xray of foot. Two hours later, patient says no longer with L foot pain and that she is able to bear weight. Says that L foot is always greater than right in size for several months. Refuses PT evaluation or foot xray at this time. Denies N/V, dizziness, abdominal pain, diarrhea.   Objective: Vital signs in last 24 hours: Filed Vitals:   09/21/12 1930 09/21/12 1945 09/22/12 0500 09/22/12 0952  BP: 166/90 128/78 110/63 144/78  Pulse: 77 71 76 91  Temp:  97.8 F (36.6 C) 98.3 F (36.8 C)   TempSrc:      Resp:  18 18   Height:  5\' 4"  (1.626 m)    Weight:  119 lb 11.2 oz (54.296 kg)    SpO2: 100% 100% 100%    Weight change:   Intake/Output Summary (Last 24 hours) at 09/22/12 1126 Last data filed at 09/22/12 0953  Gross per 24 hour  Intake     53 ml  Output    500 ml  Net   -447 ml  Vitals reviewed.  General: resting in bed, NAD  HEENT: PERRL, EOMI, no scleral icterus  Cardiac: irregularly irregular rhythm w rate in 70s, no rubs, murmurs or gallops  Pulm: clear to auscultation bilaterally, no wheezes, rales, or rhonchi  Abd: soft, nontender, nondistended, BS present  Ext: bilateral small knee effusions. L knee no longer TTP and has good ROM.Marland Kitchen LLE with 3+ pitting edema of foot to mid-shin, erythematous, chronic stasis changes over skin. Full ROM of L ankle without pain. RLE w 1+ pitting edema to midshin Neuro: alert and oriented X3, cranial nerves II-XII grossly intact, strength and sensation to light touch equal in bilateral upper and lower extremities   Lab Results: Basic Metabolic Panel:  Lab 09/22/12 1610 09/21/12 1059  NA 138 137  K 4.2 4.2  CL 99 96  CO2 26 23  GLUCOSE 95 133*  BUN 24* 26*  CREATININE 1.88*  1.99*  CALCIUM 9.0 9.4  MG -- --  PHOS -- --   CBC:  Lab 09/22/12 0210 09/21/12 1059  WBC 4.6 4.8  NEUTROABS -- 1.8  HGB 10.9* 11.9*  HCT 32.6* 35.7*  MCV 90.8 92.7  PLT 254 306   Cardiac Enzymes:  Lab 09/22/12 0210 09/21/12 1957  CKTOTAL -- --  CKMB -- --  CKMBINDEX -- --  TROPONINI <0.30 <0.30   Urine Drug Screen: Drugs of Abuse     Component Value Date/Time   LABOPIA NONE DETECTED 01/26/2010 1130   COCAINSCRNUR NONE DETECTED 01/26/2010 1130   LABBENZ POSITIVE* 01/26/2010 1130   AMPHETMU NONE DETECTED 01/26/2010 1130   THCU NONE DETECTED 01/26/2010 1130   LABBARB  Value: NONE DETECTED        DRUG SCREEN FOR MEDICAL PURPOSES ONLY.  IF CONFIRMATION IS NEEDED FOR ANY PURPOSE, NOTIFY LAB WITHIN 5 DAYS.        LOWEST DETECTABLE LIMITS FOR URINE DRUG SCREEN Drug Class       Cutoff (ng/mL) Amphetamine      1000 Barbiturate      200 Benzodiazepine   200 Tricyclics       300 Opiates          300 Cocaine  300 THC              50 01/26/2010 1130    Micro Results: No results found for this or any previous visit (from the past 240 hour(s)). Studies/Results: Dg Chest 2 View  09/21/2012  *RADIOLOGY REPORT*  Clinical Data: Chest pain and shortness of breath.  CHEST - 2 VIEW  Comparison: 11/02/2011  Findings: Stable chronic lung disease.  No edema, infiltrate or pleural fluid identified.  Heart is mildly enlarged.  No evidence of pneumothorax.  Bony structures are unremarkable.  IMPRESSION: Stable chronic lung disease.  No acute findings.   Original Report Authenticated By: Irish Lack, M.D.    Medications: I have reviewed the patient's current medications. Scheduled Meds:   . [COMPLETED] acetaminophen  650 mg Oral Once  . cloNIDine  0.2 mg Oral BID  . colchicine  0.6 mg Oral Daily  . [COMPLETED] HYDROcodone-acetaminophen  2 tablet Oral Once  . latanoprost  1 drop Both Eyes QHS  . metoprolol  200 mg Oral Daily  . [COMPLETED] ondansetron  4 mg Oral Once  . potassium chloride  SA  40 mEq Oral BID  . predniSONE  40 mg Oral Q breakfast  . Rivaroxaban  15 mg Oral Q supper  . sodium chloride  3 mL Intravenous Q12H  . sodium chloride  3 mL Intravenous Q12H  . [DISCONTINUED] Rivaroxaban  15 mg Oral Daily   Continuous Infusions:  PRN Meds:.sodium chloride, acetaminophen, acetaminophen, HYDROcodone-acetaminophen, ondansetron (ZOFRAN) IV, ondansetron, sodium chloride Assessment/Plan: *Chest pain  Had resolved at time of admission and no further episodes overnight. EKG shows rate-controlled atrial fibrillation. Troponin negative x3. Previous lipid panel w LDL at goal. Clean cath in 2002. Myoview 07/25/12 w no evidence of ischemia or infarction and EF 79%. TIMI score is 1-2.  - Continue metoprolol, hold ASA (Xarelto, ? GI bleed in past --> likely hemorrhoidal)  - Hold off on lipid panel/further risk stratification as pt followed w lab work at [facility] and has appt Monday w Dr. Algie Coffer   Chronic diastolic heart failure  Appears stable. Myoview w EF 79% as above. CXR without venous congestion, lungs clear. No evidence exacerbation  - Will hold lasix today in setting of gouty flare   Gout flare  Pt w L knee pain on day of admission. Pt describes pain as typical of gouty flare. No trauma, no appreciable warmth. Small effusion comparable to contralateral knee. No fever, leukocytosis, or hypotension, do not suspect septic joint at this time. This morning, says L knee pain much improved. Earlier this morning she complained of L ankle/foot pain, which resolved after tylenol therapy. Recommended physical therapy evaluation prior to discharge, but patient refused - Hold lasix today  - Continue colchicine, provide 5 day steroid burst of prednisone as pt cannot tolerate NSAIDs 2/2 chronic renal failure  - F/u in [facility] clinic 11/26 w Dr. Dorthula Rue   Edema of left lower extremity  Patient describes chronic worsening of LLE swelling, greater than right. Lower leg below knee is not painful. Doppler  of LLE shows no evidence of DVT, superficial thrombosis or Baker's cyst. Likely edema 2/2 venous insufficiency w diastolic dysfunction contributing. Recommended L ankle/foot xray prior to discharge to evaluate alternative cause of swelling/pain (occult fx), but patient refused and insisted that the pain resolved with Tylenol and she is able to bear weight on foot without difficulty. - nursing ambulation prior to discharge - Resume lasix on discharge - OPC follow up as above  Atrial fibrillation, rate controlled  Rate controlled at this time. Previous EKGs show the same.  - Continue metoprolol, xarelto   Dispo: Anticipated discharge today  The patient does have a current PCP (PRIBULA,CHRISTOPHER, MD), therefore will be requiring OPC follow-up after discharge.   The patient does not have transportation limitations that hinder transportation to clinic appointments.  .Services Needed at time of discharge: Y = Yes, Blank = No PT:   OT:   RN:   Equipment:   Other:     LOS: 1 day   Bronson Curb 09/22/2012, 11:26 AM

## 2012-09-22 NOTE — ED Provider Notes (Signed)
Medical screening examination/treatment/procedure(s) were conducted as a shared visit with non-physician practitioner(s) and myself.  I personally evaluated the patient during the encounter  Kameron Glazebrook, MD 09/22/12 1513 

## 2012-09-22 NOTE — Discharge Summary (Signed)
Internal Medicine Teaching [facility] Discharge Note  Name: Kim [name] MRN: [id number] DOB: 10-24-1929 78 y.o.  Date of Admission: 09/21/2012 10:44 AM Date of Discharge: 09/22/2012 Attending Physician:  Kim [name]  Discharge Diagnosis: Principal Problem:  *Atypical chest pain, possibly MSK pain Active Problems:  Gout flare of L lower extremity Chronic diastolic heart failure, grade 1  Edema of left lower extremity Atrial Fibrillation, rate controlled   Discharge Medications:   Medication List     As of 09/22/2012 11:27 AM    TAKE these medications         acetaminophen 325 MG tablet   Commonly known as: TYLENOL   Take 325 mg by mouth every 6 (six) hours as needed. pain      cloNIDine 0.2 MG tablet   Commonly known as: CATAPRES   Take 0.2 mg by mouth 2 (two) times daily.      colchicine 0.6 MG tablet   Take 0.6 mg by mouth daily.      furosemide 40 MG tablet   Commonly known as: LASIX   Take 40 mg by mouth 2 (two) times daily.      HYDROcodone-acetaminophen 5-325 MG per tablet   Commonly known as: NORCO/VICODIN   Take 2 tablets by mouth every 4 (four) hours as needed for pain.      latanoprost 0.005 % ophthalmic solution   Commonly known as: XALATAN   Place 1 drop into both eyes at bedtime.      latanoprost 0.005 % ophthalmic solution   Commonly known as: XALATAN   Place 1 drop into both eyes at bedtime. Use as directed      metoprolol 200 MG 24 hr tablet   Commonly known as: TOPROL-XL   Take 200 mg by mouth daily.      omeprazole 20 MG capsule   Commonly known as: PRILOSEC   Take 1 capsule (20 mg total) by mouth daily.      ondansetron 4 MG disintegrating tablet   Commonly known as: ZOFRAN-ODT   Take 1 tablet (4 mg total) by mouth every 8 (eight) hours as needed for nausea.      potassium chloride SA 20 MEQ tablet   Commonly known as: K-DUR,KLOR-CON   Take 40 mEq by mouth 2 (two) times daily.      predniSONE 20 MG tablet   Commonly known as: DELTASONE   Take 2 tablets (40 mg total) by mouth daily.      Rivaroxaban 15 MG Tabs tablet   Commonly known as: XARELTO   Take 15 mg by mouth daily.        Disposition and follow-up:   Kim [name] was discharged from [facility] in good condition.  At the hospital follow up visit please address  1) Gouty flare Patient reports severe pain in both L knee and L ankle which she says is similar to gouty pain. Also has debilitating rheumatoid arthritis. She was treated with a prednisone burst of 40 mg daily for 5 days and acetaminophen for pain. NSAIDs were not provided to her history of chronic kidney disease. At the time of discharge, she reports minimal pain in left knee and left foot. 2) Left foot/ankle edema Patient had significant pitting edema of left foot and ankle to mid shin. Venous Dopplers were performed and negative for clot. Patient refused foot/ankle x-ray to look for any unrecognized trauma. She reports that swelling of her lower extremities chronic, and that her left lower extremity  he is usually more swollen than her right. She refused any physical therapy evaluation prior to discharge. Unclear why swelling is so asymmetric at this point, likely secondary to venous insufficiency. Please evaluate symptoms of pain and swelling in left foot and ankle during office visit. It seems unlikely to be related to her gout.  Follow-up Appointments:     Follow-up Information    Follow up with KimMEGHA, MD. On 09/25/2012. (Please come to the outpatient clinic at 3:45 to follow up for your gout)    Contact information:   [address] [place] [address] [phone]         Discharge Orders    Future Appointments: Provider: Department: Dept Phone: Center:   09/25/2012 3:45 PM Kim Jarvis, MD [facility] [id number] [facility]     Future Orders Please Complete By Expires   Diet - low sodium heart healthy       Walker       Call MD for:  severe uncontrolled pain      Call MD for:  redness, tenderness, or signs of infection (pain, swelling, redness, odor or green/yellow discharge around incision site)      Discharge instructions      Comments:   1. Please follow up in the Internal Medicine Clinic on 01/24/12 (Tuesday) at 3:45pm with Dr. Dorthula [name] 2. Please continue taking all of your regular medications 3. Please take 4 more days of prednisone for your gout. You will take 2 tablets a day for four days.  Take omeprazole, 1 tablet a day for the next 5 days to protect your stomach while you are on prednisone.      Consultations:  none  Procedures Performed:  Chest Xray 09/21/12 Clinical Data: Chest pain and shortness of breath.  CHEST - 2 VIEW  Comparison: 11/02/2011  Findings: Stable chronic lung disease.  No edema, infiltrate or pleural fluid identified.  Heart is mildly enlarged.  No evidence of pneumothorax.  Bony structures are unremarkable.  IMPRESSION: Stable chronic lung disease.  No acute findings.   Original Report Authenticated By: Kim [name], M.D.    Left lower extremity venous doppler 09/22/12 Summary: No evidence of deep vein thrombosis involving the left lower extremity.  Admission HPI: Kim [name] is an 78 yo female w pmh of gout, chronic renal insufficiency, h/o CVA, chronic diastolic heart failure 2/2 hypertensive cardiomyopathy, pulmonary artery hypertension, and rate-controlled chronic atrial fibrillation presenting to the ED with a chief complaint of chest pain.  Pt describes that last night around 9pm, she first noticed a sharp pain in the center of her chest while sitting on her bed. She says that she felt the pain start over the center of her chest and spread out below both breasts and into her right shoulder. She also felt some tingling in her left hand. She said that the discomfort lasted all throughout the night and cannot identify any alleviating or aggravating  factors. She says that she has had similar chest pain in the past. Most recently worked up in September of this year with Kim [name] that was normal. Prior heart cath in 2002 did not show any vessel stenoses. Denies any SOB, presyncopal symptoms.  Patient's pain had remitted by the time ambulance arrived to the ED, however, she did start to complain of L knee pain. She has a history of gout and says that she has had gout flares in that L knee in the past. Says she is having exquisite pain, limitation  in moving knee secondary to pain. Takes colchicine at home, says she has been unable to tolerate allopurinol in the past. On probenecid previously but d/c'd secondary renal disease. Denies fever, chills, n/v, swelling.  Pt also says she has had worsening swelling of L lower extremity over the past few months. The left side swelling has been worse than the right side. She says the swelling is not painful. She has been taking her lasix and xarelto regularly.    Hospital Course by problem list:  Atypical chest pain  Had resolved at time of admission and no further episodes overnight. EKG showed rate-controlled atrial fibrillation. Troponin negative x3. Previous lipid panel w LDL at goal. Clean cath in 2002. Myoview 07/25/12 w no evidence of ischemia or infarction and EF 79%. TIMI score is 1-2. Continued metoprolol, held ASA (Xarelto, ? GI bleed in past --> likely hemorrhoidal). No further isk stratification as pt followed w lab work at [facility] by Dr. Algie Coffer. Do not suspect ACS for cause of pain, possibly MSK in origin.   Gout flare of L lower extremity Pt w L knee pain on day of admission. Pt described pain as typical of gouty flare. No trauma, no appreciable warmth. Small effusion comparable to contralateral knee. No fever, leukocytosis, or hypotension. No suspected septic joint. We continued her home colchicine dose of 0.6mg  daily and prescribed prednisone burst 40mg  x 5 days for presumptive gout flare in setting of  chronic renal insufficiency. Lasix was held in setting of suspected gouty flare. On hospital day 2, patient reported L knee pain much improved, but did report new L ankle/foot pain, which was not present on day of admission. This pain resolved after tylenol therapy. Recommended physical therapy evaluation prior to discharge, but patient refused.  She has f/u in [facility] clinic 11/26 w Dr. Dorthula [name]   Edema of left lower extremity  Patient described chronic worsening of LLE swelling, greater than right. Denied trauma. She was noted to have 3+ pitting edema of LLE from foot to ankle on admission. Doppler of LLE showed no evidence of DVT, superficial thrombosis or Baker's cyst. Likely edema 2/2 venous insufficiency w diastolic dysfunction contributing. Recommended L ankle/foot xray prior to discharge to evaluate alternative cause of swelling/pain such as occult fracture, but patient refused and insisted that the pain resolved with Tylenol and she was able to bear weight on foot without difficulty.   Chronic diastolic heart failure, grade 1  Stable. Myoview in September 2013 w EF 79% as above. CXR without venous congestion, lungs clear. No evidence exacerbation   Atrial fibrillation, rate controlled  Rate controlled during admission on metoprolol. No significant differences from previous EKGs. Continued home xarelto for anticoagulation.   Discharge Vitals:  BP 144/78  Pulse 91  Temp 98.3 F (36.8 C) (Oral)  Resp 18  Ht 5\' 4"  (1.626 m)  Wt 119 lb 11.2 oz (54.296 kg)  BMI 20.55 kg/m2  SpO2 100%  Discharge Day PE General: resting in bed, NAD  HEENT: PERRL, EOMI, no scleral icterus  Cardiac: irregularly irregular rhythm w rate in 70s, no rubs, murmurs or gallops  Pulm: clear to auscultation bilaterally, no wheezes, rales, or rhonchi  Abd: soft, nontender, nondistended, BS present  Ext: bilateral small knee effusions. L knee no longer TTP and has good ROM.Marland Kitchen LLE with 3+ pitting edema of foot to mid-shin,  erythematous, chronic stasis changes over skin. Full ROM of L ankle without pain. RLE w 1+ pitting edema to midshin  Neuro: alert and  oriented X3, cranial nerves II-XII grossly intact, strength and sensation to light touch equal in bilateral upper and lower extremities  Discharge Labs:  Results for orders placed during the hospital encounter of 09/21/12 (from the past 24 hour(s))  POCT I-STAT TROPONIN I     Status: Normal   Collection Time   09/21/12 12:02 PM      Component Value Range   Troponin i, poc 0.01  0.00 - 0.08 ng/mL   Comment 3           TROPONIN I     Status: Normal   Collection Time   09/21/12  7:57 PM      Component Value Range   Troponin I <0.30  <0.30 ng/mL  TROPONIN I     Status: Normal   Collection Time   09/22/12  2:10 AM      Component Value Range   Troponin I <0.30  <0.30 ng/mL  BASIC METABOLIC PANEL     Status: Abnormal   Collection Time   09/22/12  2:10 AM      Component Value Range   Sodium 138  135 - 145 mEq/L   Potassium 4.2  3.5 - 5.1 mEq/L   Chloride 99  96 - 112 mEq/L   CO2 26  19 - 32 mEq/L   Glucose, Bld 95  70 - 99 mg/dL   BUN 24 (*) 6 - 23 mg/dL   Creatinine, Ser 9.60 (*) 0.50 - 1.10 mg/dL   Calcium 9.0  8.4 - 45.4 mg/dL   GFR calc non Af Amer 24 (*) >90 mL/min   GFR calc Af Amer 28 (*) >90 mL/min  CBC     Status: Abnormal   Collection Time   09/22/12  2:10 AM      Component Value Range   WBC 4.6  4.0 - 10.5 K/uL   RBC 3.59 (*) 3.87 - 5.11 MIL/uL   Hemoglobin 10.9 (*) 12.0 - 15.0 g/dL   HCT 09.8 (*) 11.9 - 14.7 %   MCV 90.8  78.0 - 100.0 fL   MCH 30.4  26.0 - 34.0 pg   MCHC 33.4  30.0 - 36.0 g/dL   RDW 82.9  56.2 - 13.0 %   Platelets 254  150 - 400 K/uL      Signed: Bronson Curb 09/22/2012, 11:27 AM   Time Spent on Discharge: 35 min Services Ordered on Discharge: none  Equipment Ordered on Discharge: none

## 2012-09-22 NOTE — H&P (Signed)
INTERNAL MEDICINE TEACHING SERVICE Attending Admission Note  Date: 09/22/2012  Patient name: Kim [name]  Medical record number: [id number]  Date of birth: April 17, 1929    I have seen and evaluated Kim [name] and discussed their care with the Residency Team.  83 yr. Old AAF w/ pmhx significant for CKD, HTN, chronic diastolic heart failure, pulmonary hypertension, Afib, gout, presented with chest pain and LLE pain.  She described the CP as sharp while sitting at home, which spread to both sides of her chest and then to her right shoulder.  She described some associated tingling in her left hand.  She added that the pain last all night. She has had similar discomfort before and had a recent Myoview that was reported normal and a cardiac cath in 2002 with minimal luminal irregularities without significant focal stenosis. This morning she denied CP, SOB, N/V. Trop have been negative x3. In addition to her CP complaint, she also complained of left knee pain which she thinks may be gout related. She has a history of gout and and states her left knee has been affected before. She denies fever, chills. She has been on colchicine in the past and has been unable to tolerate allopurinol in the past. She has also been treated with probenecid in the past, but this was reportedly discontinued due to her CKD. She also complains of swelling of her LLE over the past months. A doppler venous U/S was performed which was negative for DVT. Her left foot is swollen this morning with limited range of motion, but no noted erythema or warmth. She denies any trauma to her foot or LLE. RN described some concerns with her home situation. Physical Exam: Blood pressure 144/78, pulse 91, temperature 98.3 F (36.8 C), temperature source Oral, resp. rate 18, height 5\' 4"  (1.626 m), weight 119 lb 11.2 oz (54.296 kg), SpO2 100.00%.  General: Vital signs reviewed and noted. Well-developed, well-nourished, in no acute  distress; alert, appropriate and cooperative throughout examination.  Head: Normocephalic, atraumatic.  Eyes: PERRL, EOMI, No signs of anemia or jaundince.  Nose: Mucous membranes moist, not inflammed, nonerythematous.  Throat: Oropharynx nonerythematous, no exudate appreciated.   Neck: No deformities, masses, or tenderness noted.Supple, No carotid Bruits, no JVD.  Lungs:  Normal respiratory effort. Clear to auscultation BL without crackles or wheezes.  Heart: irreg. S1 and S2 normal without gallop, murmur, or rubs.  Abdomen:  BS normoactive. Soft, Nondistended, non-tender.  No masses or organomegaly.  Extremities: No effusion of tenderness of left knee and noted FROM passive and active. Left ankle/foot with edema 2+ and limited ROM passive and active. 2/4 pulses bilat. No deformities noted.  Neurologic: A&O X3, CN II - XII are grossly intact. Motor strength is 5/5 in the all 4 extremities, Sensations intact to light touch, Cerebellar signs negative.  Skin: No visible rashes, scars.    Lab results: Results for orders placed during the hospital encounter of 09/21/12 (from the past 24 hour(s))  POCT I-STAT TROPONIN I     Status: Normal   Collection Time   09/21/12 12:02 PM      Component Value Range   Troponin i, poc 0.01  0.00 - 0.08 ng/mL   Comment 3           TROPONIN I     Status: Normal   Collection Time   09/21/12  7:57 PM      Component Value Range   Troponin I <0.30  <0.30 ng/mL  TROPONIN  I     Status: Normal   Collection Time   09/22/12  2:10 AM      Component Value Range   Troponin I <0.30  <0.30 ng/mL  BASIC METABOLIC PANEL     Status: Abnormal   Collection Time   09/22/12  2:10 AM      Component Value Range   Sodium 138  135 - 145 mEq/L   Potassium 4.2  3.5 - 5.1 mEq/L   Chloride 99  96 - 112 mEq/L   CO2 26  19 - 32 mEq/L   Glucose, Bld 95  70 - 99 mg/dL   BUN 24 (*) 6 - 23 mg/dL   Creatinine, Ser 9.60 (*) 0.50 - 1.10 mg/dL   Calcium 9.0  8.4 - 45.4 mg/dL   GFR  calc non Af Amer 24 (*) >90 mL/min   GFR calc Af Amer 28 (*) >90 mL/min  CBC     Status: Abnormal   Collection Time   09/22/12  2:10 AM      Component Value Range   WBC 4.6  4.0 - 10.5 K/uL   RBC 3.59 (*) 3.87 - 5.11 MIL/uL   Hemoglobin 10.9 (*) 12.0 - 15.0 g/dL   HCT 09.8 (*) 11.9 - 14.7 %   MCV 90.8  78.0 - 100.0 fL   MCH 30.4  26.0 - 34.0 pg   MCHC 33.4  30.0 - 36.0 g/dL   RDW 82.9  56.2 - 13.0 %   Platelets 254  150 - 400 K/uL    Imaging results:  Dg Chest 2 View  09/21/2012  *RADIOLOGY REPORT*  Clinical Data: Chest pain and shortness of breath.  CHEST - 2 VIEW  Comparison: 11/02/2011  Findings: Stable chronic lung disease.  No edema, infiltrate or pleural fluid identified.  Heart is mildly enlarged.  No evidence of pneumothorax.  Bony structures are unremarkable.  IMPRESSION: Stable chronic lung disease.  No acute findings.   Original Report Authenticated By: Irish Lack, M.D.      Assessment and Plan: I agree with the formulated Assessment and Plan with the following changes: 83 yr. Old AAF w/ pmhx significant for CKD, HTN, chronic diastolic heart failure, pulmonary hypertension, Afib, gout, presented with chest pain and LLE pain. 1) Atypical CP: No evidence of ACS. Reviewed EKG. CP free this morning. She has a recent normal myoview. Trop negative x 3.  She can follow up with Dr. Algie Coffer as outpatient. 2) Acute gout: Agree with steroid burst treatment. I would add PPI while giving prednisone. I would xray her left foot to r/o fracture and have PT see her to evaluate gait. She can follow up in clinic on 11/26 as scheduled. 3) Afib: Rate controlled. Cont current management. 4) Would touch base with family if patient is agreeable about her home situation, and have SW see patient and possibly have a home safety evaluation. 5) rest per resident note.  Jonah Blue, DO 11/23/201311:02 AM

## 2012-09-24 ENCOUNTER — Ambulatory Visit: Payer: Medicare Other | Admitting: Internal Medicine

## 2012-09-24 NOTE — Discharge Summary (Signed)
INTERNAL MEDICINE TEACHING SERVICE Attending Note  Date: 09/24/2012  Patient name: Kim [name]  Medical record number: [id number]  Date of birth: [birth year]    This patient has been discussed with the house staff. Please see their note for complete details. I concur with their findings and plan.   Jonah Blue, DO  09/24/2012, 2:24 PM

## 2012-09-25 ENCOUNTER — Ambulatory Visit: Payer: Medicare Other | Admitting: Internal Medicine

## 2012-10-26 ENCOUNTER — Other Ambulatory Visit: Payer: Self-pay | Admitting: *Deleted

## 2012-10-26 MED ORDER — CLONIDINE HCL 0.2 MG PO TABS: 0.2000 mg | ORAL_TABLET | Freq: Two times a day (BID) | ORAL | Status: DC

## 2012-10-26 MED ORDER — COLCHICINE 0.6 MG PO TABS: 0.6000 mg | ORAL_TABLET | Freq: Every day | ORAL | Status: DC

## 2012-10-26 NOTE — Telephone Encounter (Signed)
Will refill today but please make sure she has a follow up appointment.  She no showed her two HFU appointments.

## 2012-10-26 NOTE — Telephone Encounter (Signed)
Pt states she is completely out ?

## 2012-10-26 NOTE — Telephone Encounter (Signed)
Message sent to front desk to schedule pt an appt. 

## 2012-10-29 ENCOUNTER — Telehealth: Payer: Self-pay | Admitting: Licensed Clinical Social Worker

## 2012-10-29 ENCOUNTER — Telehealth: Payer: Self-pay | Admitting: Internal Medicine

## 2012-10-29 NOTE — Telephone Encounter (Signed)
Patient was called this am about scheduling an appt next month.  She was unable to schedule an appt with me at the current time due to lack of transportation.  I asked the patient if it was okay to pass her information on to the social worker to see if she can help.  She did say she would call back to schedule an appt if she was able to find transportation soon.

## 2012-10-29 NOTE — Telephone Encounter (Signed)
Kim [name] was referred to CSW from front office as pt has yet to schedule follow up appt due to lack of transportation.  Pt will need to wait for family to transport her to/from appt.  CSW placed call to [facility] [employer], left message as staff is off this week.  CSW placed call to Kim [name] pt in agreement to referral to [facility] if they have volunteers available to transport pt.  CSW offered assistance with [facility] application.  Pt states she is not interested in using [facility] as "they take too long".  Pt states she used [facility] with her mother and the wait time was too long.  CSW will await return call from [facility] [employer] regarding availability.

## 2012-11-06 NOTE — Telephone Encounter (Signed)
CSW has yet to receive call from [employer].  Pt provided with phone number to [facility] for future follow up.

## 2013-01-25 ENCOUNTER — Encounter: Payer: Medicare Other | Admitting: Internal Medicine

## 2013-01-30 ENCOUNTER — Emergency Department (HOSPITAL_COMMUNITY): Payer: Medicare Other

## 2013-01-30 ENCOUNTER — Encounter (HOSPITAL_COMMUNITY): Payer: Self-pay | Admitting: *Deleted

## 2013-01-30 ENCOUNTER — Inpatient Hospital Stay (HOSPITAL_COMMUNITY)
Admission: EM | Admit: 2013-01-30 | Discharge: 2013-02-01 | DRG: 309 | Disposition: A | Discharge status: Home / Self Care | Payer: Medicare Other | Attending: Cardiovascular Disease | Admitting: Cardiovascular Disease

## 2013-01-30 DIAGNOSIS — Z66 Do not resuscitate: Secondary | ICD-10-CM | POA: Diagnosis present

## 2013-01-30 DIAGNOSIS — M069 Rheumatoid arthritis, unspecified: Secondary | ICD-10-CM | POA: Diagnosis present

## 2013-01-30 DIAGNOSIS — Z87891 Personal history of nicotine dependence: Secondary | ICD-10-CM

## 2013-01-30 DIAGNOSIS — IMO0002 Reserved for concepts with insufficient information to code with codable children: Secondary | ICD-10-CM

## 2013-01-30 DIAGNOSIS — I2510 Atherosclerotic heart disease of native coronary artery without angina pectoris: Secondary | ICD-10-CM | POA: Diagnosis present

## 2013-01-30 DIAGNOSIS — D500 Iron deficiency anemia secondary to blood loss (chronic): Secondary | ICD-10-CM | POA: Diagnosis present

## 2013-01-30 DIAGNOSIS — M109 Gout, unspecified: Secondary | ICD-10-CM | POA: Diagnosis present

## 2013-01-30 DIAGNOSIS — E46 Unspecified protein-calorie malnutrition: Secondary | ICD-10-CM | POA: Diagnosis present

## 2013-01-30 DIAGNOSIS — R001 Bradycardia, unspecified: Secondary | ICD-10-CM

## 2013-01-30 DIAGNOSIS — J8410 Pulmonary fibrosis, unspecified: Secondary | ICD-10-CM | POA: Diagnosis present

## 2013-01-30 DIAGNOSIS — I129 Hypertensive chronic kidney disease with stage 1 through stage 4 chronic kidney disease, or unspecified chronic kidney disease: Secondary | ICD-10-CM | POA: Diagnosis present

## 2013-01-30 DIAGNOSIS — D62 Acute posthemorrhagic anemia: Secondary | ICD-10-CM | POA: Diagnosis present

## 2013-01-30 DIAGNOSIS — I4430 Unspecified atrioventricular block: Secondary | ICD-10-CM | POA: Diagnosis present

## 2013-01-30 DIAGNOSIS — Z8673 Personal history of transient ischemic attack (TIA), and cerebral infarction without residual deficits: Secondary | ICD-10-CM

## 2013-01-30 DIAGNOSIS — I5032 Chronic diastolic (congestive) heart failure: Secondary | ICD-10-CM | POA: Diagnosis present

## 2013-01-30 DIAGNOSIS — I2789 Other specified pulmonary heart diseases: Secondary | ICD-10-CM | POA: Diagnosis present

## 2013-01-30 DIAGNOSIS — I4891 Unspecified atrial fibrillation: Principal | ICD-10-CM | POA: Diagnosis present

## 2013-01-30 DIAGNOSIS — N182 Chronic kidney disease, stage 2 (mild): Secondary | ICD-10-CM | POA: Diagnosis present

## 2013-01-30 LAB — FERRITIN: Ferritin: 175 ng/mL (ref 10–291)

## 2013-01-30 LAB — CBC WITH DIFFERENTIAL/PLATELET
Eosinophils Relative: 1 % (ref 0–5)
HCT: 22.2 % — ABNORMAL LOW (ref 36.0–46.0)
Lymphocytes Relative: 25 % (ref 12–46)
Lymphs Abs: 1.2 10*3/uL (ref 0.7–4.0)
MCH: 31.1 pg (ref 26.0–34.0)
MCV: 92.1 fL (ref 78.0–100.0)
Monocytes Absolute: 0.5 10*3/uL (ref 0.1–1.0)
RDW: 14.4 % (ref 11.5–15.5)
WBC: 4.9 10*3/uL (ref 4.0–10.5)

## 2013-01-30 LAB — IRON AND TIBC
Iron: 35 ug/dL — ABNORMAL LOW (ref 42–135)
UIBC: 275 ug/dL (ref 125–400)

## 2013-01-30 LAB — COMPREHENSIVE METABOLIC PANEL
ALT: 19 U/L (ref 0–35)
BUN: 24 mg/dL — ABNORMAL HIGH (ref 6–23)
CO2: 16 mEq/L — ABNORMAL LOW (ref 19–32)
Calcium: 6.2 mg/dL — CL (ref 8.4–10.5)
GFR calc Af Amer: 27 mL/min — ABNORMAL LOW (ref >90)
GFR calc non Af Amer: 24 mL/min — ABNORMAL LOW (ref >90)
Glucose, Bld: 106 mg/dL — ABNORMAL HIGH (ref 70–99)
Sodium: 140 mEq/L (ref 135–145)

## 2013-01-30 LAB — DIGOXIN LEVEL: Digoxin Level: <0.3 ng/mL — ABNORMAL LOW (ref 0.8–2.0)

## 2013-01-30 MED ORDER — COLCHICINE 0.6 MG PO TABS
0.6000 mg | ORAL_TABLET | Freq: Every day | ORAL | Status: DC
  Administered 2013-01-30 – 2013-01-31 (×2): 0.6 mg via ORAL
  Filled 2013-01-30 (×2): qty 1

## 2013-01-30 MED ORDER — POTASSIUM CHLORIDE CRYS ER 20 MEQ PO TBCR: 40.0000 meq | EXTENDED_RELEASE_TABLET | Freq: Two times a day (BID) | ORAL | Status: DC

## 2013-01-30 MED ORDER — SODIUM CHLORIDE 0.9 % IJ SOLN
3.0000 mL | Freq: Two times a day (BID) | INTRAMUSCULAR | Status: DC
  Administered 2013-01-30 – 2013-01-31 (×2): 3 mL via INTRAVENOUS

## 2013-01-30 MED ORDER — AMLODIPINE BESYLATE 5 MG PO TABS
5.0000 mg | ORAL_TABLET | Freq: Once | ORAL | Status: AC
  Administered 2013-01-30: 5 mg via ORAL
  Filled 2013-01-30: qty 1

## 2013-01-30 MED ORDER — SODIUM CHLORIDE 0.9 % IJ SOLN: 3.0000 mL | INTRAMUSCULAR | Status: DC | PRN

## 2013-01-30 MED ORDER — SODIUM CHLORIDE 0.9 % IV SOLN: 250.0000 mL | INTRAVENOUS | Status: DC | PRN

## 2013-01-30 MED ORDER — ACETAMINOPHEN 325 MG PO TABS
650.0000 mg | ORAL_TABLET | Freq: Four times a day (QID) | ORAL | Status: DC | PRN
  Administered 2013-01-30 – 2013-02-01 (×5): 650 mg via ORAL
  Filled 2013-01-30 (×5): qty 2

## 2013-01-30 MED ORDER — ACETAMINOPHEN 650 MG RE SUPP: 650.0000 mg | Freq: Four times a day (QID) | RECTAL | Status: DC | PRN

## 2013-01-30 MED ORDER — ONDANSETRON HCL 4 MG/2ML IJ SOLN
4.0000 mg | Freq: Four times a day (QID) | INTRAMUSCULAR | Status: DC | PRN
  Filled 2013-01-30: qty 2

## 2013-01-30 MED ORDER — CLONIDINE HCL 0.1 MG PO TABS
0.1000 mg | ORAL_TABLET | Freq: Two times a day (BID) | ORAL | Status: DC
  Administered 2013-01-30 – 2013-02-01 (×5): 0.1 mg via ORAL
  Filled 2013-01-30 (×6): qty 1

## 2013-01-30 MED ORDER — FERROUS SULFATE 325 (65 FE) MG PO TABS
325.0000 mg | ORAL_TABLET | Freq: Every day | ORAL | Status: DC
  Administered 2013-01-30 – 2013-02-01 (×3): 325 mg via ORAL
  Filled 2013-01-30 (×4): qty 1

## 2013-01-30 MED ORDER — LATANOPROST 0.005 % OP SOLN
1.0000 [drp] | Freq: Every day | OPHTHALMIC | Status: DC
  Administered 2013-01-30 – 2013-01-31 (×2): 1 [drp] via OPHTHALMIC
  Filled 2013-01-30: qty 2.5

## 2013-01-30 MED ORDER — ACETAMINOPHEN 325 MG PO TABS
650.0000 mg | ORAL_TABLET | Freq: Once | ORAL | Status: AC
  Administered 2013-01-30: 650 mg via ORAL
  Filled 2013-01-30: qty 2

## 2013-01-30 MED ORDER — SODIUM CHLORIDE 0.9 % IJ SOLN
3.0000 mL | Freq: Two times a day (BID) | INTRAMUSCULAR | Status: DC
  Administered 2013-01-31 – 2013-02-01 (×2): 3 mL via INTRAVENOUS

## 2013-01-30 MED ORDER — AMLODIPINE BESYLATE 5 MG PO TABS
5.0000 mg | ORAL_TABLET | Freq: Every day | ORAL | Status: DC
  Filled 2013-01-30: qty 1

## 2013-01-30 NOTE — ED Notes (Signed)
Pt requesting tylenol for her gout pain

## 2013-01-30 NOTE — ED Provider Notes (Signed)
History     CSN: [id number]  Arrival date & time 01/30/13  0212   First MD Initiated Contact with Patient 01/30/13 0229      Chief Complaint  Patient presents with  . Respiratory Distress    (Consider location/radiation/quality/duration/timing/severity/associated sxs/prior treatment) Patient is a 79 y.o. female presenting with shortness of breath. The history is provided by the patient (the pt complains of some sob). No language interpreter was used.  Shortness of Breath Severity:  Mild Onset quality:  Gradual Timing:  Intermittent Progression:  Waxing and waning Chronicity:  Recurrent Context: activity   Associated symptoms: no abdominal pain, no chest pain, no cough, no headaches and no rash     Past Medical History  Diagnosis Date  . HTN (hypertension)   . AF (atrial fibrillation)     On Xarelto as of 11/2011  . Diastolic heart failure     Echo 5/11: There was moderate concentric hypertrophy. Systolic function was vigorous. The best esimated ejection fraction was in the range of 65% to 70%. Wall motion was normal  . Gout   . RA (rheumatoid arthritis)   . Chronic renal insufficiency     Baseline approx: 1.7  . Glaucoma(365)     No recent eye visits noted in EMR.   . Pulmonary artery hypertension     Per 5/11 echo, PA peak pressure: 69mm Hg  . Stroke   . Coronary artery disease     Past Surgical History  Procedure Laterality Date  . Cardiac catheterization  4/02     Minimal luminal irregularities without significant focal stenosis.  Marland Kitchen Extracapsular cataract extraction with intraocular lens  11/08    Right eye, Dr. Mitzi Davenport    Family History  Problem Relation Age of Onset  . Diabetes Mother   . Heart disease Father 28    Currently deceased  . Stroke Sister 61  . Stroke Mother 69    History  Substance Use Topics  . Smoking status: Former Smoker    Types: Cigarettes    Quit date: 10/11/1971  . Smokeless tobacco: Not on file  . Alcohol Use: No    OB  History   Grav Para Term Preterm Abortions TAB SAB Ect Mult Living                  Review of Systems  Constitutional: Negative for fatigue.  HENT: Negative for congestion, sinus pressure and ear discharge.   Eyes: Negative for discharge.  Respiratory: Positive for shortness of breath. Negative for cough.   Cardiovascular: Negative for chest pain.  Gastrointestinal: Negative for abdominal pain and diarrhea.  Genitourinary: Negative for frequency and hematuria.  Musculoskeletal: Negative for back pain.  Skin: Negative for rash.  Neurological: Negative for seizures and headaches.  Psychiatric/Behavioral: Negative for hallucinations.    Allergies  Review of patient's allergies indicates no known allergies.  Home Medications   Current Outpatient Rx  Name  Route  Sig  Dispense  Refill  . acetaminophen (TYLENOL) 325 MG tablet   Oral   Take 325 mg by mouth every 6 (six) hours as needed. pain         . cloNIDine (CATAPRES) 0.2 MG tablet   Oral   Take 1 tablet (0.2 mg total) by mouth 2 (two) times daily.   90 tablet   3   . colchicine 0.6 MG tablet   Oral   Take 1 tablet (0.6 mg total) by mouth daily.   90 tablet   3   .  furosemide (LASIX) 40 MG tablet   Oral   Take 40 mg by mouth 2 (two) times daily.         Marland Kitchen latanoprost (XALATAN) 0.005 % ophthalmic solution   Both Eyes   Place 1 drop into both eyes at bedtime.         . metoprolol (TOPROL-XL) 200 MG 24 hr tablet   Oral   Take 200 mg by mouth daily.         . potassium chloride SA (K-DUR,KLOR-CON) 20 MEQ tablet   Oral   Take 40 mEq by mouth 2 (two) times daily.         . Rivaroxaban (XARELTO) 15 MG TABS tablet   Oral   Take 15 mg by mouth daily.           BP 125/75  Pulse 70  Temp(Src) 97.4 F (36.3 C) (Oral)  Resp 14  SpO2 100%  Physical Exam  Constitutional: She is oriented to person, place, and time. She appears well-developed.  HENT:  Head: Normocephalic and atraumatic.  Eyes:  Conjunctivae and EOM are normal. No scleral icterus.  Neck: Neck supple. No thyromegaly present.  Cardiovascular: Exam reveals no gallop and no friction rub.   No murmur heard. Bradycardia with irregular rate  Pulmonary/Chest: No stridor. She has no wheezes. She has no rales. She exhibits no tenderness.  Abdominal: She exhibits no distension. There is no tenderness. There is no rebound.  Musculoskeletal: She exhibits edema.  Swelling right hand from gout  Lymphadenopathy:    She has no cervical adenopathy.  Neurological: She is oriented to person, place, and time. Coordination normal.  Skin: No rash noted. No erythema.  Psychiatric: She has a normal mood and affect. Her behavior is normal.    ED Course  Procedures (including critical care time)  Labs Reviewed  CBC WITH DIFFERENTIAL - Abnormal; Notable for the following:    RBC 2.41 (*)    Hemoglobin 7.5 (*)    HCT 22.2 (*)    All other components within normal limits  COMPREHENSIVE METABOLIC PANEL - Abnormal; Notable for the following:    Chloride 113 (*)    CO2 16 (*)    Glucose, Bld 106 (*)    BUN 24 (*)    Creatinine, Ser 1.91 (*)    Calcium 6.2 (*)    Total Protein 4.4 (*)    Albumin 2.0 (*)    AST 55 (*)    GFR calc non Af Amer 24 (*)    GFR calc Af Amer 27 (*)    All other components within normal limits  DIGOXIN LEVEL - Abnormal; Notable for the following:    Digoxin Level <0.3 (*)    All other components within normal limits  TROPONIN I   Dg Chest Port 1 View  01/30/2013  *RADIOLOGY REPORT*  Clinical Data: Shortness of breath and pain all over.  PORTABLE CHEST - 1 VIEW  Comparison: 09/21/2012  Findings: Shallow inspiration with mild elevation of the right hemidiaphragm.  Borderline heart size with normal pulmonary vascularity.  No focal consolidation or airspace disease in the lungs.  No blunting of costophrenic angles.  Mild fibrosis in the lung bases.  No pneumothorax.  Mediastinal contours appear intact. There is  increased density demonstrated in the soft tissues superficial to the humeral head suggesting calcific tendonitis.  IMPRESSION: Elevation right hemidiaphragm.  Fibrosis in the lung bases.  No evidence of active pulmonary disease.   Original Report Authenticated By: Chrissie Noa  Andria Meuse, M.D.      1. Bradycardia       Date: 01/30/2013  Rate:36  Rhythm: atrial fibrillation  QRS Axis: normal  Intervals: normal  ST/T Wave abnormalities: nonspecific ST changes  Conduction Disutrbances:none  Narrative Interpretation:   Old EKG Reviewed: changes noted  CRITICAL CARE Performed by: Teya Otterson L   Total critical care time: 40 Critical care time was exclusive of separately billable procedures and treating other patients.  Critical care was necessary to treat or prevent imminent or life-threatening deterioration.  Critical care was time spent personally by me on the following activities: development of treatment plan with patient and/or surrogate as well as nursing, discussions with consultants, evaluation of patient's response to treatment, examination of patient, obtaining history from patient or surrogate, ordering and performing treatments and interventions, ordering and review of laboratory studies, ordering and review of radiographic studies, pulse oximetry and re-evaluation of patient's condition.  MDM          Benny Lennert, MD 01/30/13 [phone]

## 2013-01-30 NOTE — ED Notes (Signed)
Per EMS: pt coming from home with c/o respiratory distress. Pt is on home O2, upon EMS arrival pt was not on O2. Pt was 98% on RA. Pt states she took her oxygen off for five minutes to wash her face and became winded. Lung sounds clear, equal and unlabored, skin warm and dry, A&Ox4. Pt is in a-fib with a rate between 33-57.

## 2013-01-30 NOTE — ED Notes (Signed)
Report taken from [place] [facility].

## 2013-01-30 NOTE — H&P (Signed)
Kim [name] is an 79 y.o. female.   Chief Complaint: Shortness of breath HPI: 79 years old female with past history of hypertension, atrial fibrillation, gout, rheumatoid arthritis, chronic renal insufficiency, coronary artery disease and pulmonary artery hypertension has shortness of breath. She has high grade AV block from B-blocker and clonidine use. Chest x-ray also shows pulmonary fibrosis.  Past Medical History  Diagnosis Date  . HTN (hypertension)   . AF (atrial fibrillation)     On Xarelto as of 11/2011  . Diastolic heart failure     Echo 5/11: There was moderate concentric hypertrophy. Systolic function was vigorous. The best esimated ejection fraction was in the range of 65% to 70%. Wall motion was normal  . Gout   . RA (rheumatoid arthritis)   . Chronic renal insufficiency     Baseline approx: 1.7  . Glaucoma(365)     No recent eye visits noted in EMR.   . Pulmonary artery hypertension     Per 5/11 echo, PA peak pressure: 69mm Hg  . Stroke   . Coronary artery disease       Past Surgical History  Procedure Laterality Date  . Cardiac catheterization  4/02     Minimal luminal irregularities without significant focal stenosis.  Marland Kitchen Extracapsular cataract extraction with intraocular lens  11/08    Right eye, Dr. Mitzi Davenport    Family History  Problem Relation Age of Onset  . Diabetes Mother   . Heart disease Father 7    Currently deceased  . Stroke Sister 5  . Stroke Mother 74   Social History:  reports that she quit smoking about 41 years ago. Her smoking use included Cigarettes. She smoked 0.00 packs per day. She does not have any smokeless tobacco history on file. She reports that she does not drink alcohol or use illicit drugs.  Allergies: No Known Allergies   (Not in a hospital admission)  Results for orders placed during the hospital encounter of 01/30/13 (from the past 48 hour(s))  CBC WITH DIFFERENTIAL     Status: Abnormal   Collection Time   01/30/13  2:57 AM      Result Value Range   WBC 4.9  4.0 - 10.5 K/uL   RBC 2.41 (*) 3.87 - 5.11 MIL/uL   Hemoglobin 7.5 (*) 12.0 - 15.0 g/dL   HCT 86.5 (*) 78.4 - 69.6 %   MCV 92.1  78.0 - 100.0 fL   MCH 31.1  26.0 - 34.0 pg   MCHC 33.8  30.0 - 36.0 g/dL   RDW 29.5  28.4 - 13.2 %   Platelets 188  150 - 400 K/uL   Neutrophils Relative 65  43 - 77 %   Neutro Abs 3.2  1.7 - 7.7 K/uL   Lymphocytes Relative 25  12 - 46 %   Lymphs Abs 1.2  0.7 - 4.0 K/uL   Monocytes Relative 10  3 - 12 %   Monocytes Absolute 0.5  0.1 - 1.0 K/uL   Eosinophils Relative 1  0 - 5 %   Eosinophils Absolute 0.0  0.0 - 0.7 K/uL   Basophils Relative 0  0 - 1 %   Basophils Absolute 0.0  0.0 - 0.1 K/uL  COMPREHENSIVE METABOLIC PANEL     Status: Abnormal   Collection Time    01/30/13  2:57 AM      Result Value Range   Sodium 140  135 - 145 mEq/L   Potassium 4.4  3.5 -  5.1 mEq/L   Chloride 113 (*) 96 - 112 mEq/L   CO2 16 (*) 19 - 32 mEq/L   Glucose, Bld 106 (*) 70 - 99 mg/dL   BUN 24 (*) 6 - 23 mg/dL   Creatinine, Ser 4.69 (*) 0.50 - 1.10 mg/dL   Calcium 6.2 (*) 8.4 - 10.5 mg/dL   Comment: CRITICAL RESULT CALLED TO, READ BACK BY AND VERIFIED WITH:     BREWER,E RN 01/30/2013 0444 JORDANS   Total Protein 4.4 (*) 6.0 - 8.3 g/dL   Albumin 2.0 (*) 3.5 - 5.2 g/dL   AST 55 (*) 0 - 37 U/L   ALT 19  0 - 35 U/L   Alkaline Phosphatase 107  39 - 117 U/L   Total Bilirubin 0.6  0.3 - 1.2 mg/dL   GFR calc non Af Amer 24 (*) >90 mL/min   GFR calc Af Amer 27 (*) >90 mL/min   Comment:            The eGFR has been calculated     using the CKD EPI equation.     This calculation has not been     validated in all clinical     situations.     eGFR's persistently     <90 mL/min signify     possible Chronic Kidney Disease.  DIGOXIN LEVEL     Status: Abnormal   Collection Time    01/30/13  2:57 AM      Result Value Range   Digoxin Level <0.3 (*) 0.8 - 2.0 ng/mL  TROPONIN I     Status: None   Collection Time    01/30/13   3:05 AM      Result Value Range   Troponin I <0.30  <0.30 ng/mL   Comment:            Due to the release kinetics of cTnI,     a negative result within the first hours     of the onset of symptoms does not rule out     myocardial infarction with certainty.     If myocardial infarction is still suspected,     repeat the test at appropriate intervals.   Dg Chest Port 1 View  01/30/2013  *RADIOLOGY REPORT*  Clinical Data: Shortness of breath and pain all over.  PORTABLE CHEST - 1 VIEW  Comparison: 09/21/2012  Findings: Shallow inspiration with mild elevation of the right hemidiaphragm.  Borderline heart size with normal pulmonary vascularity.  No focal consolidation or airspace disease in the lungs.  No blunting of costophrenic angles.  Mild fibrosis in the lung bases.  No pneumothorax.  Mediastinal contours appear intact. There is increased density demonstrated in the soft tissues superficial to the humeral head suggesting calcific tendonitis.  IMPRESSION: Elevation right hemidiaphragm.  Fibrosis in the lung bases.  No evidence of active pulmonary disease.   Original Report Authenticated By: Burman Nieves, M.D.     @ROS @ Constitutional: Denies fever, chills. +ve fatigue  HEENT: Denies photophobia,. + hemoptysis.  Respiratory: Versus baseline SOB, chronic pulmonary fibrosis and pulmonary hypertension.  Cardiovascular: Denies chest pain, and leg swelling. Endorses occasional palpitations , but not increased in frequency.  Gastrointestinal: endorses nausea, denies current vomiting, abdominal pain, diarrhea, constipation, blood in stool and abdominal distention.  Genitourinary: Denies dysuria, urgency, frequency. Endorses hematuria.  Musculoskeletal: endorses diffuse arthralgias and back pain. States has chronic joint swelling in her hands, knees, and ankles secondary to gout and rheumatoid arthritis.  Skin: Denies  pallor, rash and wound.  Neurological: Denies dizziness, syncope,  light-headedness, numbness and headaches. endorses weakness.   Blood pressure 125/75, pulse 70, temperature 97.4 F (36.3 C), temperature source Oral, resp. rate 14, SpO2 100.00%.  HEENT: [place]/AT, Eyes-Brown, PERL, EOMI, Conjunctiva-Pink, Sclera-Non-icteric  Neck: No JVD, No bruit, Trachea midline.  Lungs: Clear, Bilateral.  Cardiac: Irregular and slow rhythm, normal S1 and S2, no S3. II/VI systolic murmur. Abdomen: Soft, non-tender.  Extremities: No edema present. No cyanosis. No clubbing. Gouty swelling of both hands and right forearm with deformity.  CNS: AxOx3, Cranial nerves grossly intact, moves all 4 extremities. Right handed.  Skin: Warm and dry.  Assessment/Plan Atrial fibrillation with slow ventricular response. Acute on chronic blood loss anemia CKD, II Chronic diastolic heart failure Chronic pulmonary fibrosis Chronic pulmonary hypertension Rheumatoid arthritis and gout  Admit/Hold clonidine and B-blocker for now. Hold Xarelto. Stool for occult blood. Patient agrees to DNR. May need temporary pacemaker   Lylla Eifler S 01/30/2013, 6:12 AM

## 2013-01-31 LAB — BASIC METABOLIC PANEL
BUN: 34 mg/dL — ABNORMAL HIGH (ref 6–23)
CO2: 22 mEq/L (ref 19–32)
Glucose, Bld: 86 mg/dL (ref 70–99)
Potassium: 4.1 mEq/L (ref 3.5–5.1)
Sodium: 138 mEq/L (ref 135–145)

## 2013-01-31 LAB — CBC WITH DIFFERENTIAL/PLATELET
Basophils Relative: 1 % (ref 0–1)
Eosinophils Absolute: 0.2 10*3/uL (ref 0.0–0.7)
Eosinophils Relative: 3 % (ref 0–5)
HCT: 32.5 % — ABNORMAL LOW (ref 36.0–46.0)
Hemoglobin: 11 g/dL — ABNORMAL LOW (ref 12.0–15.0)
Lymphs Abs: 1 10*3/uL (ref 0.7–4.0)
MCH: 30.1 pg (ref 26.0–34.0)
MCHC: 33.8 g/dL (ref 30.0–36.0)
MCV: 89 fL (ref 78.0–100.0)
Monocytes Absolute: 0.9 10*3/uL (ref 0.1–1.0)
Monocytes Relative: 13 % — ABNORMAL HIGH (ref 3–12)
RBC: 3.65 MIL/uL — ABNORMAL LOW (ref 3.87–5.11)

## 2013-01-31 LAB — CBC
HCT: 29.8 % — ABNORMAL LOW (ref 36.0–46.0)
Hemoglobin: 10.3 g/dL — ABNORMAL LOW (ref 12.0–15.0)
MCHC: 34.6 g/dL (ref 30.0–36.0)
RBC: 3.36 MIL/uL — ABNORMAL LOW (ref 3.87–5.11)

## 2013-01-31 MED ORDER — DILTIAZEM HCL ER BEADS 240 MG PO CP24
240.0000 mg | ORAL_CAPSULE | Freq: Every day | ORAL | Status: DC
  Administered 2013-01-31 – 2013-02-01 (×2): 240 mg via ORAL
  Filled 2013-01-31 (×2): qty 1

## 2013-01-31 MED ORDER — METOPROLOL SUCCINATE ER 100 MG PO TB24: 100.0000 mg | ORAL_TABLET | Freq: Every day | ORAL | Status: DC

## 2013-01-31 MED ORDER — DILTIAZEM HCL 100 MG IV SOLR
10.0000 mg/h | INTRAVENOUS | Status: DC
  Administered 2013-01-31: 5 mg/h via INTRAVENOUS
  Filled 2013-01-31: qty 100

## 2013-01-31 MED ORDER — METOPROLOL SUCCINATE ER 50 MG PO TB24
50.0000 mg | ORAL_TABLET | Freq: Every day | ORAL | Status: DC
  Administered 2013-01-31 – 2013-02-01 (×2): 50 mg via ORAL
  Filled 2013-01-31 (×2): qty 1

## 2013-01-31 MED ORDER — RIVAROXABAN 15 MG PO TABS
15.0000 mg | ORAL_TABLET | Freq: Once | ORAL | Status: AC
  Administered 2013-01-31: 15 mg via ORAL
  Filled 2013-01-31 (×2): qty 1

## 2013-01-31 MED ORDER — CALCIUM CARBONATE ANTACID 500 MG PO CHEW: 2.0000 | CHEWABLE_TABLET | Freq: Two times a day (BID) | ORAL | Status: DC

## 2013-01-31 MED ORDER — DILTIAZEM HCL ER BEADS 240 MG PO CP24: 240.0000 mg | ORAL_CAPSULE | Freq: Every day | ORAL | Status: DC

## 2013-01-31 MED ORDER — CALCIUM CARBONATE ANTACID 500 MG PO CHEW
400.0000 mg | CHEWABLE_TABLET | Freq: Two times a day (BID) | ORAL | Status: DC
  Administered 2013-01-31 – 2013-02-01 (×2): 400 mg via ORAL
  Filled 2013-01-31 (×3): qty 2

## 2013-01-31 MED ORDER — COLCHICINE 0.6 MG PO TABS
0.3000 mg | ORAL_TABLET | Freq: Every day | ORAL | Status: DC
  Administered 2013-02-01: 09:00:00 via ORAL
  Filled 2013-01-31: qty 0.5

## 2013-01-31 MED ORDER — METOPROLOL SUCCINATE ER 50 MG PO TB24: 50.0000 mg | ORAL_TABLET | Freq: Once | ORAL | Status: DC

## 2013-01-31 MED ORDER — CLONIDINE HCL 0.2 MG PO TABS: 0.1000 mg | ORAL_TABLET | Freq: Two times a day (BID) | ORAL | Status: DC

## 2013-01-31 NOTE — Progress Notes (Signed)
Subjective:  Had atrial fibrillation with rapid response. IV diltiazem and oral metoprolol worked in decreasing heart rate and back to sinus rhythm. No palpitation.  Objective:  Vital Signs in the last 24 hours: Temp:  [97.3 F (36.3 C)-98.6 F (37 C)] 97.3 F (36.3 C) (04/03 0830) Pulse Rate:  [71-88] 88 (04/03 0700) Cardiac Rhythm:  [-] Atrial fibrillation (04/03 0800) Resp:  [13-23] 18 (04/03 0700) BP: (125-200)/(70-99) 172/84 mmHg (04/03 0700) SpO2:  [95 %-100 %] 97 % (04/03 0700) Weight:  [55.112 kg (121 lb 8 oz)-55.6 kg (122 lb 9.2 oz)] 55.112 kg (121 lb 8 oz) (04/03 0500)  Physical Exam: BP Readings from Last 1 Encounters:  01/31/13 172/84     Wt Readings from Last 1 Encounters:  01/31/13 55.112 kg (121 lb 8 oz)    Weight change:   HEENT: [place]/AT, Eyes-Brown, PERL, EOMI, Conjunctiva-Pale pink, Sclera-Non-icteric Neck: No JVD, No bruit, Trachea midline. Lungs:  Clear, Bilateral. Cardiac:  Regular rhythm, normal S1 and S2, no S3. II/VI systolic murmur. Abdomen:  Soft, non-tender. Extremities: No cyanosis. No clubbing. Swelling of both hands and right forearm with deformity as before.  CNS: AxOx3, Cranial nerves grossly intact, moves all 4 extremities. Right handed. Skin: Warm and dry.   Intake/Output from previous day: 04/02 0701 - 04/03 0700 In: 240 [P.O.:240] Out: 1000 [Urine:1000]    Lab Results: BMET    Component Value Date/Time   NA 138 01/31/2013 0503   K 4.1 01/31/2013 0503   CL 104 01/31/2013 0503   CO2 22 01/31/2013 0503   GLUCOSE 86 01/31/2013 0503   BUN 34* 01/31/2013 0503   CREATININE 2.22* 01/31/2013 0503   CREATININE 1.65* 12/16/2011 1725   CALCIUM 8.8 01/31/2013 0503   GFRNONAA 20* 01/31/2013 0503   GFRAA 23* 01/31/2013 0503   CBC    Component Value Date/Time   WBC 5.3 01/31/2013 0503   RBC 3.36* 01/31/2013 0503   HGB 10.3* 01/31/2013 0503   HCT 29.8* 01/31/2013 0503   PLT 241 01/31/2013 0503   MCV 88.7 01/31/2013 0503   MCH 30.7 01/31/2013 0503   MCHC 34.6 01/31/2013  0503   RDW 14.4 01/31/2013 0503   LYMPHSABS 1.2 01/30/2013 0257   MONOABS 0.5 01/30/2013 0257   EOSABS 0.0 01/30/2013 0257   BASOSABS 0.0 01/30/2013 0257   CARDIAC ENZYMES Lab Results  Component Value Date   CKTOTAL 106 10/14/2011   CKMB 2.6 10/14/2011   TROPONINI <0.30 01/30/2013    Scheduled Meds: . cloNIDine  0.1 mg Oral BID  . colchicine  0.6 mg Oral Daily  . ferrous sulfate  325 mg Oral Q breakfast  . latanoprost  1 drop Both Eyes QHS  . metoprolol succinate  50 mg Oral Daily  . metoprolol succinate  50 mg Oral Once  . sodium chloride  3 mL Intravenous Q12H  . sodium chloride  3 mL Intravenous Q12H   Continuous Infusions: . diltiazem (CARDIZEM) infusion 5 mg/hr (01/31/13 0835)   PRN Meds:.sodium chloride, acetaminophen, acetaminophen, ondansetron, sodium chloride  Assessment/Plan: Atrial fibrillation with rapid ventricular response.  Acute on chronic blood loss anemia  CKD, II  Chronic diastolic heart failure  Chronic pulmonary fibrosis  Chronic pulmonary hypertension  Rheumatoid arthritis and gout  IV diltiazem. Small dose B-blocker use.   LOS: 1 day    Orpah Cobb  MD  01/31/2013, 9:45 AM

## 2013-01-31 NOTE — Progress Notes (Signed)
Pt does not want to go home tonight.  She c/o little SOB after sitting on side of bed earlier.  Will call MD.  Vs 170/116 hr 88.  Will continue to monitor. Emilie [place]

## 2013-01-31 NOTE — Discharge Summary (Signed)
Physician Discharge Summary  Patient ID: Kim [name] MRN: [id number] DOB/AGE: 77-Jul-1930 77 y.o.  Admit date: 01/30/2013 Discharge date: 01/31/2013  Admission Diagnoses: Atrial fibrillation with slow ventricular response.  Acute on chronic blood loss anemia  CKD, II  Chronic diastolic heart failure  Chronic pulmonary fibrosis  Chronic pulmonary hypertension  Rheumatoid arthritis and gout  Discharge Diagnoses:  Principle Problems:  * Atrial fibrillation with slow ventricular response, now paroxysmal atrial fibrillation with controlled ventricular response.  Chronic blood loss anemia or Anemia of chronic disease CKD, II  Chronic diastolic heart failure  Chronic pulmonary fibrosis  Chronic pulmonary hypertension  Rheumatoid arthritis and gout Hypocalcemia, chronic Protein calorie malnutrition, chronic  Discharged Condition: stable  Hospital Course: 79 years old female with past history of hypertension, atrial fibrillation, gout, rheumatoid arthritis, chronic renal insufficiency, coronary artery disease and pulmonary artery hypertension has chronic shortness of breath. She has high grade AV block from B-blocker and clonidine use. Chest x-ray also shows pulmonary fibrosis. Her B-blocker dose and Clonidine dose were reduced 50 % and diltiazem 240 mg./24 hour capsule was added. Patient now has ectopic atrial rhythm to sinus rhythm and paroxysmal atrial fibrillation with controlled ventricular response.  Her shortness of breath did not improve. She takes oxygen at home all the time. She insisted on going home with new medications prescriptions. She will be followed by me and primary care in 1-2 weeks. Here low hemoglobin level of 7.5 g/dl appears to be laboratory  error as subsequent Hgb level x 2 was over 10 g/dl. Her Xarelto was resumed. She was advised to do calcium supplement. She is DNR with overall poor long-term prognosis.  Consults: None  Significant Diagnostic Studies: labs:  Normal WBC and platelets count. Low Hgb of 11 g/dl.  Normal electrolytes, elevated BUN/Cr-24/1.91. Low Calcium of 6.2 mg/dl with low albumin of 2 g/dl.  CXR- chronic pulmonary fibrosis  Treatments: cardiac meds: metoprolol, diltiazem and Clonidine  Discharge Exam: Blood pressure 171/79, pulse 88, temperature 98.9 F (37.2 C), temperature source Oral, resp. rate 22, height 5\' 4"  (1.626 m), weight 55.112 kg (121 lb 8 oz), SpO2 97.00%. Gen: Poorly nourished and averagely developed female in mild respiratory distress with supplemental oxygen. HEENT: [place]/AT, Eyes-Brown, PERL, EOMI, Conjunctiva-Pink, Sclera-Non-icteric  Neck: No JVD, No bruit, Trachea midline.  Lungs: Fine crackles at bases, Bilateral.  Cardiac: Irregular and slow rhythm, normal S1 and S2, no S3. II/VI systolic murmur.  Abdomen: Soft, non-tender.  Extremities: No edema present. No cyanosis. No clubbing. Gouty swelling of both hands and right forearm with deformity.  CNS: AxOx3, Cranial nerves grossly intact, moves all 4 extremities. Right handed.  Skin: Warm and dry   Disposition: 01-Home or Self Care     Medication List    TAKE these medications       acetaminophen 325 MG tablet  Commonly known as:  TYLENOL  Take 325 mg by mouth every 6 (six) hours as needed. pain     cloNIDine 0.2 MG tablet  Commonly known as:  CATAPRES  Take 0.5 tablets (0.1 mg total) by mouth 2 (two) times daily.     colchicine 0.6 MG tablet  Take 1 tablet (0.6 mg total) by mouth daily.     diltiazem 240 MG 24 hr capsule  Commonly known as:  TIAZAC  Take 1 capsule (240 mg total) by mouth daily.     furosemide 40 MG tablet  Commonly known as:  LASIX  Take 40 mg by mouth 2 (two) times daily.  latanoprost 0.005 % ophthalmic solution  Commonly known as:  XALATAN  Place 1 drop into both eyes at bedtime.     metoprolol succinate 100 MG 24 hr tablet  Commonly known as:  TOPROL-XL  Take 1 tablet (100 mg total) by mouth daily.      potassium chloride SA 20 MEQ tablet  Commonly known as:  K-DUR,KLOR-CON  Take 40 mEq by mouth 2 (two) times daily.     Rivaroxaban 15 MG Tabs tablet  Commonly known as:  XARELTO  Take 15 mg by mouth daily.           Follow-up Information   Follow up with PRIBULA,CHRISTOPHER, MD In 1 week.   Contact information:   [address] [place] [address] [phone]       Follow up with [facility] S, MD. Schedule an appointment as soon as possible for a visit in 2 weeks.   Contact information:   [address] [place] [address] [phone]       Signed: [name] 01/31/2013, 6:56 PM

## 2013-02-01 MED ORDER — CLONIDINE HCL 0.2 MG PO TABS
0.2000 mg | ORAL_TABLET | Freq: Two times a day (BID) | ORAL | Status: DC
  Filled 2013-02-01 (×2): qty 1

## 2013-02-01 MED ORDER — CLONIDINE HCL 0.1 MG PO TABS
0.1000 mg | ORAL_TABLET | Freq: Once | ORAL | Status: AC
  Administered 2013-02-01: 0.1 mg via ORAL
  Filled 2013-02-01: qty 1

## 2013-02-01 MED ORDER — FUROSEMIDE 10 MG/ML IJ SOLN
40.0000 mg | Freq: Once | INTRAMUSCULAR | Status: AC
  Administered 2013-02-01: 40 mg via INTRAVENOUS

## 2013-02-01 MED ORDER — FUROSEMIDE 10 MG/ML IJ SOLN
INTRAMUSCULAR | Status: AC
  Filled 2013-02-01: qty 4

## 2013-02-01 MED ORDER — CLONIDINE HCL 0.2 MG PO TABS: 0.2000 mg | ORAL_TABLET | Freq: Two times a day (BID) | ORAL | Status: DC

## 2013-02-01 NOTE — Progress Notes (Signed)
Discharged home accompanied by grandson, stable. Discharge instructions  and prescription given to pt. Belongings with pt.

## 2013-02-01 NOTE — Progress Notes (Signed)
  Echocardiogram 2D Echocardiogram has been performed.  Kim [name] 02/01/2013, 10:43 AM

## 2013-02-01 NOTE — Discharge Summary (Signed)
Subjective:  Some shortness of breath continues. Heart rate in 90-100.  Objective:  Vital Signs in the last 24 hours: Temp:  [98.1 F (36.7 C)-99.3 F (37.4 C)] 98.1 F (36.7 C) (04/04 0830) Pulse Rate:  [85-94] 86 (04/04 0830) Cardiac Rhythm:  [-] Normal sinus rhythm (04/04 0737) Resp:  [18-30] 28 (04/04 0830) BP: (118-194)/(58-125) 177/71 mmHg (04/04 0830) SpO2:  [91 %-98 %] 96 % (04/04 0830) Weight:  [55.1 kg (121 lb 7.6 oz)] 55.1 kg (121 lb 7.6 oz) (04/04 0359)  Physical Exam: BP Readings from Last 1 Encounters:  02/01/13 177/71     Wt Readings from Last 1 Encounters:  02/01/13 55.1 kg (121 lb 7.6 oz)    Weight change: -0.5 kg (-1 lb 1.6 oz)  HEENT: [place]/AT, Eyes-Brown, PERL, EOMI, Conjunctiva-Pale pink, Sclera-Non-icteric Neck: No JVD, No bruit, Trachea midline. Lungs:  Fine crackles at bases, bilateral. Cardiac:  Irregular rhythm, normal S1 and S2, no S3. II/VI systolic murmur. Abdomen:  Soft, non-tender. Extremities:  Trace edema present. No cyanosis. No clubbing. Significant deformity of hands and toes. CNS: AxOx3, Cranial nerves grossly intact, moves all 4 extremities. Right handed. Skin: Warm and dry.   Intake/Output from previous day: 04/03 0701 - 04/04 0700 In: 380 [P.O.:320; I.V.:60] Out: 1050 [Urine:1050]    Lab Results: BMET    Component Value Date/Time   NA 138 01/31/2013 0503   K 4.1 01/31/2013 0503   CL 104 01/31/2013 0503   CO2 22 01/31/2013 0503   GLUCOSE 86 01/31/2013 0503   BUN 34* 01/31/2013 0503   CREATININE 2.22* 01/31/2013 0503   CREATININE 1.65* 12/16/2011 1725   CALCIUM 8.8 01/31/2013 0503   GFRNONAA 20* 01/31/2013 0503   GFRAA 23* 01/31/2013 0503   CBC    Component Value Date/Time   WBC 6.8 01/31/2013 0951   RBC 3.65* 01/31/2013 0951   HGB 11.0* 01/31/2013 0951   HCT 32.5* 01/31/2013 0951   PLT 264 01/31/2013 0951   MCV 89.0 01/31/2013 0951   MCH 30.1 01/31/2013 0951   MCHC 33.8 01/31/2013 0951   RDW 14.4 01/31/2013 0951   LYMPHSABS 1.0 01/31/2013 0951   MONOABS  0.9 01/31/2013 0951   EOSABS 0.2 01/31/2013 0951   BASOSABS 0.0 01/31/2013 0951   CARDIAC ENZYMES Lab Results  Component Value Date   CKTOTAL 106 10/14/2011   CKMB 2.6 10/14/2011   TROPONINI <0.30 01/30/2013    Scheduled Meds: . calcium carbonate  400 mg of elemental calcium Oral BID  . cloNIDine  0.2 mg Oral BID  . colchicine  0.3 mg Oral Daily  . diltiazem  240 mg Oral Daily  . ferrous sulfate  325 mg Oral Q breakfast  . latanoprost  1 drop Both Eyes QHS  . metoprolol succinate  50 mg Oral Daily  . sodium chloride  3 mL Intravenous Q12H  . sodium chloride  3 mL Intravenous Q12H   Continuous Infusions:  PRN Meds:.sodium chloride, acetaminophen, acetaminophen, ondansetron, sodium chloride  Assessment/Plan: Atrial fibrillation with slow ventricular response, now paroxysmal atrial fibrillation with controlled ventricular response.  Chronic blood loss anemia or Anemia of chronic disease  CKD, II  Chronic diastolic heart failure  Chronic pulmonary fibrosis  Chronic pulmonary hypertension  Rheumatoid arthritis and gout  Hypocalcemia, chronic  Protein calorie malnutrition, chronic  Increase clonidine to 0.2 mg. twice daily. Home today.   LOS: 2 days    Orpah Cobb  MD  02/01/2013, 9:13 AM

## 2013-03-12 ENCOUNTER — Other Ambulatory Visit: Payer: Self-pay | Admitting: *Deleted

## 2013-03-12 MED ORDER — POTASSIUM CHLORIDE CRYS ER 20 MEQ PO TBCR: 40.0000 meq | EXTENDED_RELEASE_TABLET | Freq: Two times a day (BID) | ORAL | Status: DC

## 2013-03-12 MED ORDER — RIVAROXABAN 15 MG PO TABS: 15.0000 mg | ORAL_TABLET | Freq: Every day | ORAL | Status: DC

## 2013-03-12 MED ORDER — FUROSEMIDE 40 MG PO TABS: 40.0000 mg | ORAL_TABLET | Freq: Two times a day (BID) | ORAL | Status: DC

## 2013-03-12 NOTE — Telephone Encounter (Signed)
Pt was called about sched an appt; stated she has trouble walking and is taking therapy. Also stated someone had called her [employer]) about transportation, But she has not heard back from anyone - I gave her [employer] telephone# [phone]). She said she would call them and then make an appt.

## 2013-03-12 NOTE — Telephone Encounter (Signed)
Ms Abreu needs to be seen in clinic.  She was supposed to make a follow up appointment in January and didn't.  She also didn't make a follow up appointment after her hospitalization in April.  Please call her and schedule an appointment ASAP.  I will refill her medications for one month in the mean time.  Leodis Sias, MD Internal Medicine Resident, PGY [name] Co-Chief Resident, Internal Medicine Pager: [phone] 03/12/2013 2:04 PM

## 2013-03-22 ENCOUNTER — Encounter: Payer: Self-pay | Admitting: Internal Medicine

## 2013-04-15 ENCOUNTER — Other Ambulatory Visit: Payer: Self-pay | Admitting: *Deleted

## 2013-04-15 MED ORDER — RIVAROXABAN 15 MG PO TABS: 15.0000 mg | ORAL_TABLET | Freq: Every day | ORAL | Status: DC

## 2013-04-15 NOTE — Telephone Encounter (Signed)
Please schedule a follow-up appointment within 1 month. 

## 2013-05-13 ENCOUNTER — Other Ambulatory Visit: Payer: Self-pay | Admitting: *Deleted

## 2013-05-13 ENCOUNTER — Other Ambulatory Visit: Payer: Self-pay | Admitting: Internal Medicine

## 2013-05-13 MED ORDER — CLONIDINE HCL 0.2 MG PO TABS: 0.2000 mg | ORAL_TABLET | Freq: Two times a day (BID) | ORAL | Status: DC

## 2013-05-13 MED ORDER — FUROSEMIDE 40 MG PO TABS: 40.0000 mg | ORAL_TABLET | Freq: Two times a day (BID) | ORAL | Status: DC

## 2013-05-13 MED ORDER — POTASSIUM CHLORIDE CRYS ER 20 MEQ PO TBCR: 40.0000 meq | EXTENDED_RELEASE_TABLET | Freq: Two times a day (BID) | ORAL | Status: DC

## 2013-05-13 NOTE — Telephone Encounter (Signed)
Rx faxed in.

## 2013-05-13 NOTE — Telephone Encounter (Signed)
I refilled for 1 month.  Please schedule a clinic appointment within 1 month; given patient's impaired renal function, using Xarelto for stroke prophylaxis should be reconsidered.

## 2013-05-21 ENCOUNTER — Encounter: Payer: Self-pay | Admitting: Internal Medicine

## 2013-05-21 ENCOUNTER — Telehealth: Payer: Self-pay | Admitting: Internal Medicine

## 2013-05-21 NOTE — Telephone Encounter (Signed)
I have been making attempts to call this patient since last week with no success.  When you call her home phone number [phone] it just rings.  Then a message comes up (a female's voice is on the recording) saying you have reached [phone] please leave a message or I can be reached at this number [phone].  With the first number the memory is full and the second number msg comes on stating customer is not available please try your call again later. Patient was sent a letter back in May asking her to call for an appt, but I will send another letter again this month.

## 2013-06-11 ENCOUNTER — Other Ambulatory Visit: Payer: Self-pay | Admitting: Internal Medicine

## 2013-06-26 ENCOUNTER — Telehealth: Payer: Self-pay | Admitting: *Deleted

## 2013-06-26 ENCOUNTER — Inpatient Hospital Stay (HOSPITAL_COMMUNITY)
Admission: EM | Admit: 2013-06-26 | Discharge: 2013-07-04 | DRG: 291 | Disposition: A | Discharge status: Home Health | Payer: Medicare Other | Attending: Cardiovascular Disease | Admitting: Cardiovascular Disease

## 2013-06-26 ENCOUNTER — Emergency Department (HOSPITAL_COMMUNITY): Payer: Medicare Other

## 2013-06-26 ENCOUNTER — Encounter (HOSPITAL_COMMUNITY): Payer: Self-pay | Admitting: Emergency Medicine

## 2013-06-26 ENCOUNTER — Other Ambulatory Visit: Payer: Self-pay

## 2013-06-26 DIAGNOSIS — Z79899 Other long term (current) drug therapy: Secondary | ICD-10-CM

## 2013-06-26 DIAGNOSIS — Z681 Body mass index (BMI) 19 or less, adult: Secondary | ICD-10-CM

## 2013-06-26 DIAGNOSIS — I5031 Acute diastolic (congestive) heart failure: Secondary | ICD-10-CM

## 2013-06-26 DIAGNOSIS — I059 Rheumatic mitral valve disease, unspecified: Secondary | ICD-10-CM | POA: Diagnosis present

## 2013-06-26 DIAGNOSIS — I517 Cardiomegaly: Secondary | ICD-10-CM | POA: Diagnosis present

## 2013-06-26 DIAGNOSIS — I5043 Acute on chronic combined systolic (congestive) and diastolic (congestive) heart failure: Principal | ICD-10-CM | POA: Diagnosis present

## 2013-06-26 DIAGNOSIS — I079 Rheumatic tricuspid valve disease, unspecified: Secondary | ICD-10-CM | POA: Diagnosis present

## 2013-06-26 DIAGNOSIS — Z87891 Personal history of nicotine dependence: Secondary | ICD-10-CM

## 2013-06-26 DIAGNOSIS — N182 Chronic kidney disease, stage 2 (mild): Secondary | ICD-10-CM | POA: Diagnosis present

## 2013-06-26 DIAGNOSIS — Z8673 Personal history of transient ischemic attack (TIA), and cerebral infarction without residual deficits: Secondary | ICD-10-CM

## 2013-06-26 DIAGNOSIS — E43 Unspecified severe protein-calorie malnutrition: Secondary | ICD-10-CM | POA: Insufficient documentation

## 2013-06-26 DIAGNOSIS — I2510 Atherosclerotic heart disease of native coronary artery without angina pectoris: Secondary | ICD-10-CM | POA: Diagnosis present

## 2013-06-26 DIAGNOSIS — J8410 Pulmonary fibrosis, unspecified: Secondary | ICD-10-CM | POA: Diagnosis present

## 2013-06-26 DIAGNOSIS — I2789 Other specified pulmonary heart diseases: Secondary | ICD-10-CM | POA: Diagnosis present

## 2013-06-26 DIAGNOSIS — Z9981 Dependence on supplemental oxygen: Secondary | ICD-10-CM

## 2013-06-26 DIAGNOSIS — D62 Acute posthemorrhagic anemia: Secondary | ICD-10-CM | POA: Diagnosis present

## 2013-06-26 DIAGNOSIS — M069 Rheumatoid arthritis, unspecified: Secondary | ICD-10-CM | POA: Diagnosis present

## 2013-06-26 DIAGNOSIS — E871 Hypo-osmolality and hyponatremia: Secondary | ICD-10-CM | POA: Diagnosis not present

## 2013-06-26 DIAGNOSIS — I129 Hypertensive chronic kidney disease with stage 1 through stage 4 chronic kidney disease, or unspecified chronic kidney disease: Secondary | ICD-10-CM | POA: Diagnosis present

## 2013-06-26 DIAGNOSIS — I4891 Unspecified atrial fibrillation: Secondary | ICD-10-CM | POA: Diagnosis present

## 2013-06-26 DIAGNOSIS — M109 Gout, unspecified: Secondary | ICD-10-CM | POA: Diagnosis present

## 2013-06-26 DIAGNOSIS — I509 Heart failure, unspecified: Secondary | ICD-10-CM | POA: Diagnosis present

## 2013-06-26 DIAGNOSIS — Z7901 Long term (current) use of anticoagulants: Secondary | ICD-10-CM

## 2013-06-26 LAB — BASIC METABOLIC PANEL
BUN: 20 mg/dL (ref 6–23)
CO2: 20 mEq/L (ref 19–32)
Chloride: 100 mEq/L (ref 96–112)
GFR calc non Af Amer: 48 mL/min — ABNORMAL LOW (ref >90)
Glucose, Bld: 121 mg/dL — ABNORMAL HIGH (ref 70–99)
Potassium: 3.6 mEq/L (ref 3.5–5.1)
Sodium: 138 mEq/L (ref 135–145)

## 2013-06-26 LAB — URINALYSIS, ROUTINE W REFLEX MICROSCOPIC
Glucose, UA: NEGATIVE mg/dL
Ketones, ur: NEGATIVE mg/dL
Protein, ur: NEGATIVE mg/dL
Urobilinogen, UA: 0.2 mg/dL (ref 0.0–1.0)

## 2013-06-26 LAB — URINE MICROSCOPIC-ADD ON

## 2013-06-26 LAB — PROTIME-INR
INR: 2.06 — ABNORMAL HIGH (ref 0.00–1.49)
Prothrombin Time: 22.6 seconds — ABNORMAL HIGH (ref 11.6–15.2)

## 2013-06-26 LAB — CBC
HCT: 30.9 % — ABNORMAL LOW (ref 36.0–46.0)
Hemoglobin: 10.1 g/dL — ABNORMAL LOW (ref 12.0–15.0)
RBC: 3.35 MIL/uL — ABNORMAL LOW (ref 3.87–5.11)
WBC: 8.3 10*3/uL (ref 4.0–10.5)

## 2013-06-26 LAB — POCT I-STAT TROPONIN I: Troponin i, poc: 0 ng/mL (ref 0.00–0.08)

## 2013-06-26 MED ORDER — SODIUM CHLORIDE 0.9 % IV BOLUS (SEPSIS)
500.0000 mL | Freq: Once | INTRAVENOUS | Status: AC
  Administered 2013-06-26: 500 mL via INTRAVENOUS

## 2013-06-26 MED ORDER — METOPROLOL TARTRATE 1 MG/ML IV SOLN
INTRAVENOUS | Status: AC
  Filled 2013-06-26: qty 5

## 2013-06-26 MED ORDER — COLCHICINE 0.6 MG PO TABS
0.6000 mg | ORAL_TABLET | Freq: Every day | ORAL | Status: DC
  Administered 2013-06-26 – 2013-07-04 (×9): 0.6 mg via ORAL
  Filled 2013-06-26 (×9): qty 1

## 2013-06-26 MED ORDER — DILTIAZEM HCL 25 MG/5ML IV SOLN
5.0000 mg | Freq: Once | INTRAVENOUS | Status: AC
  Administered 2013-06-26: 5 mg via INTRAVENOUS
  Filled 2013-06-26: qty 5

## 2013-06-26 MED ORDER — SODIUM CHLORIDE 0.9 % IJ SOLN
3.0000 mL | Freq: Two times a day (BID) | INTRAMUSCULAR | Status: DC
  Administered 2013-06-26 – 2013-07-02 (×8): 3 mL via INTRAVENOUS
  Administered 2013-07-03: 09:00:00 via INTRAVENOUS
  Administered 2013-07-03 – 2013-07-04 (×2): 3 mL via INTRAVENOUS

## 2013-06-26 MED ORDER — ONDANSETRON HCL 4 MG/2ML IJ SOLN
4.0000 mg | Freq: Once | INTRAMUSCULAR | Status: AC
  Administered 2013-06-26: 4 mg via INTRAVENOUS
  Filled 2013-06-26: qty 2

## 2013-06-26 MED ORDER — ACETAMINOPHEN 325 MG PO TABS: 325.0000 mg | ORAL_TABLET | Freq: Four times a day (QID) | ORAL | Status: DC | PRN

## 2013-06-26 MED ORDER — ISOSORB DINITRATE-HYDRALAZINE 20-37.5 MG PO TABS
1.0000 | ORAL_TABLET | Freq: Two times a day (BID) | ORAL | Status: DC
  Administered 2013-06-26 – 2013-07-04 (×16): 1 via ORAL
  Filled 2013-06-26 (×18): qty 1

## 2013-06-26 MED ORDER — ONDANSETRON HCL 4 MG/2ML IJ SOLN
4.0000 mg | Freq: Four times a day (QID) | INTRAMUSCULAR | Status: DC | PRN
  Administered 2013-06-28 – 2013-07-03 (×6): 4 mg via INTRAVENOUS
  Filled 2013-06-26 (×5): qty 2

## 2013-06-26 MED ORDER — CLONIDINE HCL 0.2 MG PO TABS
0.2000 mg | ORAL_TABLET | Freq: Two times a day (BID) | ORAL | Status: DC
  Administered 2013-06-26: 0.2 mg via ORAL
  Filled 2013-06-26 (×3): qty 1

## 2013-06-26 MED ORDER — METOPROLOL TARTRATE 1 MG/ML IV SOLN
5.0000 mg | Freq: Once | INTRAVENOUS | Status: AC
  Administered 2013-06-26: 5 mg via INTRAVENOUS

## 2013-06-26 MED ORDER — RIVAROXABAN 15 MG PO TABS
15.0000 mg | ORAL_TABLET | Freq: Every day | ORAL | Status: DC
  Administered 2013-06-27: 15 mg via ORAL
  Filled 2013-06-26: qty 1

## 2013-06-26 MED ORDER — CARVEDILOL 3.125 MG PO TABS
3.1250 mg | ORAL_TABLET | Freq: Two times a day (BID) | ORAL | Status: DC
  Administered 2013-06-27 – 2013-07-04 (×15): 3.125 mg via ORAL
  Filled 2013-06-26 (×18): qty 1

## 2013-06-26 MED ORDER — ACETAMINOPHEN 325 MG PO TABS
650.0000 mg | ORAL_TABLET | ORAL | Status: DC | PRN
  Administered 2013-06-26 – 2013-07-03 (×12): 650 mg via ORAL
  Filled 2013-06-26 (×4): qty 2
  Filled 2013-06-26: qty 1
  Filled 2013-06-26 (×6): qty 2

## 2013-06-26 MED ORDER — FUROSEMIDE 10 MG/ML IJ SOLN
40.0000 mg | Freq: Two times a day (BID) | INTRAMUSCULAR | Status: DC
  Administered 2013-06-26 – 2013-07-01 (×11): 40 mg via INTRAVENOUS
  Filled 2013-06-26 (×15): qty 4

## 2013-06-26 MED ORDER — DILTIAZEM HCL 25 MG/5ML IV SOLN
10.0000 mg | Freq: Once | INTRAVENOUS | Status: AC
  Administered 2013-06-26: 10 mg via INTRAVENOUS

## 2013-06-26 MED ORDER — SODIUM CHLORIDE 0.9 % IJ SOLN: 3.0000 mL | INTRAMUSCULAR | Status: DC | PRN

## 2013-06-26 MED ORDER — SODIUM CHLORIDE 0.9 % IV SOLN
250.0000 mL | INTRAVENOUS | Status: DC | PRN
  Administered 2013-06-27: 10 mL via INTRAVENOUS

## 2013-06-26 MED ORDER — LATANOPROST 0.005 % OP SOLN
1.0000 [drp] | Freq: Every day | OPHTHALMIC | Status: DC
  Administered 2013-06-26 – 2013-07-03 (×8): 1 [drp] via OPHTHALMIC
  Filled 2013-06-26: qty 2.5

## 2013-06-26 MED ORDER — POTASSIUM CHLORIDE CRYS ER 20 MEQ PO TBCR
40.0000 meq | EXTENDED_RELEASE_TABLET | Freq: Two times a day (BID) | ORAL | Status: DC
  Administered 2013-06-26 – 2013-06-30 (×8): 40 meq via ORAL
  Filled 2013-06-26 (×10): qty 2

## 2013-06-26 MED ORDER — DILTIAZEM HCL 100 MG IV SOLR
10.0000 mg/h | INTRAVENOUS | Status: DC
  Administered 2013-06-26 – 2013-06-27 (×2): 10 mg/h via INTRAVENOUS
  Administered 2013-06-27 (×2): 15 mg/h via INTRAVENOUS
  Administered 2013-06-28: 10 mg/h via INTRAVENOUS

## 2013-06-26 MED ORDER — DILTIAZEM HCL 25 MG/5ML IV SOLN: 10.0000 mg | Freq: Once | INTRAVENOUS | Status: DC

## 2013-06-26 MED ORDER — FUROSEMIDE 10 MG/ML IJ SOLN
40.0000 mg | Freq: Once | INTRAMUSCULAR | Status: AC
  Administered 2013-06-26: 40 mg via INTRAVENOUS
  Filled 2013-06-26: qty 4

## 2013-06-26 MED ORDER — CALCIUM CARBONATE ANTACID 500 MG PO CHEW
2.0000 | CHEWABLE_TABLET | Freq: Two times a day (BID) | ORAL | Status: DC
  Administered 2013-06-26 – 2013-07-04 (×16): 400 mg via ORAL
  Filled 2013-06-26 (×17): qty 2

## 2013-06-26 NOTE — ED Notes (Signed)
Pt placed on bedpan. HR jumped to 190 during movement

## 2013-06-26 NOTE — ED Provider Notes (Signed)
CSN: [id number]     Arrival date & time 06/26/13  1540 History   First MD Initiated Contact with Patient 06/26/13 1541     Chief Complaint  Patient presents with  . Atrial Fibrillation  . Tachycardia   (Consider location/radiation/quality/duration/timing/severity/associated sxs/prior Treatment) HPI This is an 79 year old female with a history of atrial fibrillation, gout, linear fibrosis, diastolic heart failure, and renal insufficiency who presents with shortness of breath. The patient reports several days of increasing shortness of breath. She states her shortness of breath gets worse when she goes from lying to sitting. She denies any chest pain. She denies any cough or fever. Patient reports that she just "felt bad." She endorses nausea without vomiting. She's had decreased by mouth intake over the last 2 days. She endorses increased pain in the bilateral hands and bilateral feet. She thinks this is her gout.  The patient was brought in by EMS. Per EMS, she was noted to be in A. fib with RVR with a rate of 190. She received 10 mg of Cardizem which dropped her rate into the low 100s. Patient had improvement of her symptoms. Past Medical History  Diagnosis Date  . HTN (hypertension)   . AF (atrial fibrillation)     On Xarelto as of 11/2011  . Diastolic heart failure     Echo 5/11: There was moderate concentric hypertrophy. Systolic function was vigorous. The best esimated ejection fraction was in the range of 65% to 70%. Wall motion was normal  . Gout   . RA (rheumatoid arthritis)   . Chronic renal insufficiency     Baseline approx: 1.7  . Glaucoma     No recent eye visits noted in EMR.   . Pulmonary artery hypertension     Per 5/11 echo, PA peak pressure: 69mm Hg  . Stroke   . Coronary artery disease    Past Surgical History  Procedure Laterality Date  . Cardiac catheterization  4/02     Minimal luminal irregularities without significant focal stenosis.  Marland Kitchen Extracapsular cataract  extraction with intraocular lens  11/08    Right eye, Dr. Mitzi Davenport   Family History  Problem Relation Age of Onset  . Diabetes Mother   . Heart disease Father 35    Currently deceased  . Stroke Sister 28  . Stroke Mother 53   History  Substance Use Topics  . Smoking status: Former Smoker    Types: Cigarettes    Quit date: 10/11/1971  . Smokeless tobacco: Not on file  . Alcohol Use: No   OB History   Grav Para Term Preterm Abortions TAB SAB Ect Mult Living                 Review of Systems  Constitutional: Negative for fever.  Respiratory: Positive for shortness of breath. Negative for cough and chest tightness.   Cardiovascular: Negative for chest pain.  Gastrointestinal: Positive for nausea. Negative for vomiting and abdominal pain.  Genitourinary: Negative for dysuria.  Musculoskeletal: Positive for joint swelling. Negative for back pain.       Swelling of the bilateral hands and bilateral feet  Skin: Negative for wound.  Neurological: Negative for dizziness and syncope.  Psychiatric/Behavioral: Negative for confusion.  All other systems reviewed and are negative.    Allergies  Review of patient's allergies indicates no known allergies.  Home Medications   No current outpatient prescriptions on file. BP 99/52  Pulse 92  Temp(Src) 97.7 F (36.5 C) (Oral)  Resp  27  Ht 5' 4.75" (1.645 m)  Wt 117 lb 8.1 oz (53.3 kg)  BMI 19.7 kg/m2  SpO2 97% Physical Exam  Nursing note and vitals reviewed. Constitutional: She is oriented to person, place, and time.  Elderly, chronically ill-appearing  HENT:  Head: Normocephalic and atraumatic.  Mucous membranes dry  Eyes: Pupils are equal, round, and reactive to light.  Neck: Neck supple.  Cardiovascular: Normal rate and normal heart sounds.   Irregularly irregular rhythm  Pulmonary/Chest: Effort normal. No respiratory distress. She has no wheezes.  Decreased breath sounds at bases, supplemental oxygen in place   Abdominal: Soft. Bowel sounds are normal. There is no tenderness.  Musculoskeletal: She exhibits edema.  Diffuse swelling of the bilateral hands with contractures of the right hand noted, tophi noted to the first digit of the bilateral feet, mild swelling and tenderness to palpation of the bilateral ankles  Neurological: She is alert and oriented to person, place, and time.  Skin: Skin is warm and dry.  Psychiatric: She has a normal mood and affect.    ED Course  Procedures (including critical care time) Labs Review Labs Reviewed  CBC - Abnormal; Notable for the following:    RBC 3.35 (*)    Hemoglobin 10.1 (*)    HCT 30.9 (*)    RDW 16.5 (*)    Platelets 425 (*)    All other components within normal limits  BASIC METABOLIC PANEL - Abnormal; Notable for the following:    Glucose, Bld 121 (*)    GFR calc non Af Amer 48 (*)    GFR calc Af Amer 55 (*)    All other components within normal limits  PRO B NATRIURETIC PEPTIDE - Abnormal; Notable for the following:    Pro B Natriuretic peptide (BNP) 3679.0 (*)    All other components within normal limits  PROTIME-INR - Abnormal; Notable for the following:    Prothrombin Time 22.6 (*)    INR 2.06 (*)    All other components within normal limits  URINALYSIS, ROUTINE W REFLEX MICROSCOPIC - Abnormal; Notable for the following:    APPearance HAZY (*)    Hgb urine dipstick TRACE (*)    Leukocytes, UA MODERATE (*)    All other components within normal limits  URINE MICROSCOPIC-ADD ON - Abnormal; Notable for the following:    Squamous Epithelial / LPF FEW (*)    Bacteria, UA FEW (*)    All other components within normal limits  URINE CULTURE  MRSA PCR SCREENING  BASIC METABOLIC PANEL  TSH  POCT I-STAT TROPONIN I   EKG independently reviewed by myself: Atrial fibrillation with a rate of 139, no ST elevation noted, T wave inversions in the lateral leads V4 through V6 and inferior leads 2, 3 Imaging Review Dg Chest Port 1  View  06/26/2013   *RADIOLOGY REPORT*  Clinical Data: Atrial fibrillation.  Tachycardia.  PORTABLE CHEST - 1 VIEW  Comparison: 01/30/2013.  Findings: Cardiomegaly with bilateral perihilar airspace disease and basilar airspace disease compatible with cardiogenic pulmonary edema. There is moderate right pleural effusion and a probable small left pleural effusion which layers dependently/posteriorly. Monitoring leads are projected over the chest. .  IMPRESSION: Findings compatible with moderate CHF with right greater than left pleural effusions.   Original Report Authenticated By: Andreas Newport, M.D.    MDM   1. Atrial fibrillation   2. CHF (congestive heart failure), acute, diastolic     This is an 79 year old female who presents with  2 days of worsening shortness of breath. She is elderly and chronically ill appearing on my exam. Initial exam is notable for tachycardia, dry mucous membranes, decreased breath sounds bilateral bases without rails and hand and foot swelling. Patient continues to be in A. fib with RVR although her symptoms are improved with a rate of 120s to 140s. Patient was given 5 mg of diltiazem IV push. The patient was given a 500 cc bolus she has a history of heart failure but clinically she appears dry. Lab work was obtained. Chest x-ray was also ordered.  Patient noted to have bilateral pleural effusions, elevated BNP. I spoke with her cardiologist who will admit her. He requested I start Lasix. This was initiated.    Shon Baton, MD 06/27/13 [phone]

## 2013-06-26 NOTE — Progress Notes (Signed)
Pt in a fib with rvr rate 190-200.  Pt sob.  Called and notified dr. Algie Coffer .  Orders received.  Per md ok to increase cardizem to 15mg /hr,  Giving dose of lopressor iv.

## 2013-06-26 NOTE — ED Notes (Signed)
Per ems normally has SOB, home o2, increased sob x2 days. Hx of A fib, afib rvr 190 on ems arrival. 120/84 bp. Received 10mg  cardizem per ems IV push, rate dropped down to 100-150, and symptoms disappeared. Denies CP. Slight dizziness and nausea. 20 in L AC. Bilaterally swollen on both hands, pt states this is normal due to history of gout and arthritis. Hx of heart failure and on laxis at home.

## 2013-06-26 NOTE — ED Notes (Signed)
Pt c/o nausea.  

## 2013-06-26 NOTE — H&P (Signed)
Kim [name] is an 79 y.o. female.   Chief Complaint: Shortness of breath HPI: 79 years old female with past history of hypertension, atrial fibrillation, gout, rheumatoid arthritis, chronic renal insufficiency, coronary artery disease and pulmonary artery hypertension has shortness of breath x 2 days.   Past Medical History  Diagnosis Date  . HTN (hypertension)   . AF (atrial fibrillation)     On Xarelto as of 11/2011  . Diastolic heart failure     Echo 5/11: There was moderate concentric hypertrophy. Systolic function was vigorous. The best esimated ejection fraction was in the range of 65% to 70%. Wall motion was normal  . Gout   . RA (rheumatoid arthritis)   . Chronic renal insufficiency     Baseline approx: 1.7  . Glaucoma     No recent eye visits noted in EMR.   . Pulmonary artery hypertension     Per 5/11 echo, PA peak pressure: 69mm Hg  . Stroke   . Coronary artery disease       Past Surgical History  Procedure Laterality Date  . Cardiac catheterization  4/02     Minimal luminal irregularities without significant focal stenosis.  Marland Kitchen Extracapsular cataract extraction with intraocular lens  11/08    Right eye, Dr. Mitzi Davenport    Family History  Problem Relation Age of Onset  . Diabetes Mother   . Heart disease Father 58    Currently deceased  . Stroke Sister 58  . Stroke Mother 110   Social History:  reports that she quit smoking about 41 years ago. Her smoking use included Cigarettes. She smoked 0.00 packs per day. She does not have any smokeless tobacco history on file. She reports that she does not drink alcohol or use illicit drugs.  Allergies: No Known Allergies   (Not in a hospital admission)  Results for orders placed during the hospital encounter of 06/26/13 (from the past 48 hour(s))  CBC     Status: Abnormal   Collection Time    06/26/13  3:41 PM      Result Value Range   WBC 8.3  4.0 - 10.5 K/uL   RBC 3.35 (*) 3.87 - 5.11 MIL/uL   Hemoglobin  10.1 (*) 12.0 - 15.0 g/dL   HCT 16.1 (*) 09.6 - 04.5 %   MCV 92.2  78.0 - 100.0 fL   MCH 30.1  26.0 - 34.0 pg   MCHC 32.7  30.0 - 36.0 g/dL   RDW 40.9 (*) 81.1 - 91.4 %   Platelets 425 (*) 150 - 400 K/uL  BASIC METABOLIC PANEL     Status: Abnormal   Collection Time    06/26/13  3:41 PM      Result Value Range   Sodium 138  135 - 145 mEq/L   Potassium 3.6  3.5 - 5.1 mEq/L   Chloride 100  96 - 112 mEq/L   CO2 20  19 - 32 mEq/L   Glucose, Bld 121 (*) 70 - 99 mg/dL   BUN 20  6 - 23 mg/dL   Creatinine, Ser 7.82  0.50 - 1.10 mg/dL   Calcium 9.3  8.4 - 95.6 mg/dL   GFR calc non Af Amer 48 (*) >90 mL/min   GFR calc Af Amer 55 (*) >90 mL/min   Comment: (NOTE)     The eGFR has been calculated using the CKD EPI equation.     This calculation has not been validated in all clinical situations.  eGFR's persistently <90 mL/min signify possible Chronic Kidney     Disease.  PRO B NATRIURETIC PEPTIDE     Status: Abnormal   Collection Time    06/26/13  3:41 PM      Result Value Range   Pro B Natriuretic peptide (BNP) 3679.0 (*) 0 - 450 pg/mL  PROTIME-INR     Status: Abnormal   Collection Time    06/26/13  3:41 PM      Result Value Range   Prothrombin Time 22.6 (*) 11.6 - 15.2 seconds   INR 2.06 (*) 0.00 - 1.49  POCT I-STAT TROPONIN I     Status: None   Collection Time    06/26/13  3:49 PM      Result Value Range   Troponin i, poc 0.00  0.00 - 0.08 ng/mL   Comment 3            Comment: Due to the release kinetics of cTnI,     a negative result within the first hours     of the onset of symptoms does not rule out     myocardial infarction with certainty.     If myocardial infarction is still suspected,     repeat the test at appropriate intervals.   Dg Chest Port 1 View  06/26/2013   *RADIOLOGY REPORT*  Clinical Data: Atrial fibrillation.  Tachycardia.  PORTABLE CHEST - 1 VIEW  Comparison: 01/30/2013.  Findings: Cardiomegaly with bilateral perihilar airspace disease and basilar  airspace disease compatible with cardiogenic pulmonary edema. There is moderate right pleural effusion and a probable small left pleural effusion which layers dependently/posteriorly. Monitoring leads are projected over the chest. .  IMPRESSION: Findings compatible with moderate CHF with right greater than left pleural effusions.   Original Report Authenticated By: Andreas Newport, M.D.    @ROS @ Constitutional: Denies fever, chills. +ve fatigue  HEENT: Denies photophobia,. + hemoptysis.  Respiratory: baseline SOB, chronic pulmonary fibrosis and pulmonary hypertension.  Cardiovascular: Denies chest pain, arthritic hand and feet swelling. Endorses occasional palpitations , but not increased in frequency.  Gastrointestinal: endorses nausea, denies current vomiting, abdominal pain, diarrhea, constipation, blood in stool and abdominal distention.  Genitourinary: Denies dysuria, urgency, frequency. Endorses hematuria.  Musculoskeletal: endorses diffuse arthralgias and back pain. States has chronic joint swelling in her hands, knees, and ankles secondary to gout and rheumatoid arthritis.  Skin: Denies pallor, rash and wound.  Neurological: Denies dizziness, syncope, light-headedness, numbness and headaches. endorses weakness.   Blood pressure 164/86, pulse 59, temperature 98 F (36.7 C), temperature source Oral, resp. rate 26, height 5\' 4"  (1.626 m), weight 54.432 kg (120 lb), SpO2 96.00%. HEENT: [place]/AT, Eyes-Brown, PERL, EOMI, Conjunctiva-Pale pink, Sclera-Non-icteric  Neck: + JVD, No bruit, Trachea midline.  Lungs: Basal crackles, Bilateral.  Cardiac: Irregular and slow rhythm, normal S1 and S2, no S3. II/VI systolic murmur.  Abdomen: Soft, non-tender.  Extremities: No cyanosis. No clubbing. Gouty swelling of both hands and right forearm with deformity.  CNS: AxOx3, Cranial nerves grossly intact, moves all 4 extremities. Right handed.  Skin: Warm and dry.   Assessment/Plan Acute systolic and  diastolic heart failure Atrial fibrillation with rapid ventricular response.  Acute on chronic blood loss anemia  CKD, II  Chronic diastolic heart failure  Chronic pulmonary fibrosis  Chronic pulmonary hypertension  Rheumatoid arthritis and gout  Admit to step down. IV diltiazem/IV lasix/home medications  Aliyah Abeyta S 06/26/2013, 6:00 PM

## 2013-06-26 NOTE — Telephone Encounter (Signed)
Pt calls and does sound winded, she states for 2 days she has been short of breath, yesterday had a h/a, she states she thinks she had a stroke in April when she was released from hospital because she hasnt been able to walk since then. She states the shortness of breath is getting worse, she is ask to call 911 and she states she does not want to do so she wants to wait on her grdaughters to come in from class in about 1 hour and they will bring her to the ED, she is encouraged to call 911 and refuses. She is then ask if she continues to get worse to please consider calling, she states she will

## 2013-06-26 NOTE — Progress Notes (Signed)
Pt refuses foley catheter.  States she has had a couple in the past and both times she had an UTI .

## 2013-06-26 NOTE — Progress Notes (Signed)
Rec'd patient from the ED via stretcher on the monitor. Patient in A-fib RVR rate 140's-160's and cardizem drip is infusing. Patient washed with CHG clothes, PCR swab completed. Patient able to turn self with 1 person assist. Patient glasses and cellphone at bedside. Patient has no compliant of chest pain. Patient has no compliant of pain. No skin breakdown noted. Will continue to monitor. Onuchukwu, Kim [name] M

## 2013-06-26 NOTE — Telephone Encounter (Signed)
OK 

## 2013-06-27 ENCOUNTER — Encounter (HOSPITAL_COMMUNITY): Payer: Self-pay | Admitting: Cardiology

## 2013-06-27 LAB — BASIC METABOLIC PANEL
BUN: 19 mg/dL (ref 6–23)
CO2: 20 mEq/L (ref 19–32)
Calcium: 8.4 mg/dL (ref 8.4–10.5)
GFR calc non Af Amer: 42 mL/min — ABNORMAL LOW (ref >90)
Glucose, Bld: 117 mg/dL — ABNORMAL HIGH (ref 70–99)
Potassium: 3.5 mEq/L (ref 3.5–5.1)
Sodium: 139 mEq/L (ref 135–145)

## 2013-06-27 LAB — URINE CULTURE

## 2013-06-27 MED ORDER — RIVAROXABAN 15 MG PO TABS
15.0000 mg | ORAL_TABLET | Freq: Every day | ORAL | Status: DC
  Filled 2013-06-27: qty 1

## 2013-06-27 MED ORDER — RIVAROXABAN 15 MG PO TABS
15.0000 mg | ORAL_TABLET | Freq: Every day | ORAL | Status: DC
  Administered 2013-06-28 – 2013-07-03 (×6): 15 mg via ORAL
  Filled 2013-06-27 (×7): qty 1

## 2013-06-27 MED ORDER — ENSURE COMPLETE PO LIQD
237.0000 mL | Freq: Two times a day (BID) | ORAL | Status: DC
  Administered 2013-06-28 – 2013-07-04 (×11): 237 mL via ORAL

## 2013-06-27 NOTE — Progress Notes (Signed)
INITIAL NUTRITION ASSESSMENT  DOCUMENTATION CODES Per approved criteria  -Severe malnutrition in the context of chronic illness or injury   INTERVENTION: 1.  Supplements; Ensure Complete po BID, each supplement provides 350 kcal and 13 grams of protein.   NUTRITION DIAGNOSIS: Inadequate oral intake related to shortness of breath, fatigue as evidenced by pt report.   Monitor:  1.  Food/Beverage; pt meeting >/=90% estimated needs with tolerance. 2.  Wt/wt change; monitor trends  Reason for Assessment: MST  79 y.o. female  Admitting Dx: shortness of breath  ASSESSMENT: Pt admitted with shortness of breath.  Pt states she has had chronic illness for >30 years.  She reports she has a good appetite, however is not always able to eat well due to fatigue.  Pt states she eats 3 small meals and a few snacks daily.   She reports wt loss from 160-120 lbs over 2 years ago, but has since been stable.  Nutrition Focused Physical Exam: Subcutaneous Fat:  Orbital Region: moderate-severe Upper Arm Region: moderate Thoracic and Lumbar Region: mild-moderate  Muscle:  Temple Region: moderate Clavicle Bone Region: severe Clavicle and Acromion Bone Region: severe Scapular Bone Region: severe Dorsal Hand: wnl Patellar Region: wnl Anterior Thigh Region: wnl Posterior Calf Region: moderate-severe  Edema: none present  Pt states she drinks some Ensure weekly and does not go days without meals.  She reports she prefers her home cooking, however is not capable of this anymore.  She has family who provides meals for her, however it is often not what she prefers. She restricts salt.   Based on limited dietary recall, RD suspects pt does not meet 75% of her estimated needs consistently.  Pt meets criteria for severe MALNUTRITION in the context of chronic as evidenced by poor PO intake and physical exam.   Height: Ht Readings from Last 1 Encounters:  06/26/13 5' 4.75" (1.645 m)    Weight: Wt  Readings from Last 1 Encounters:  06/27/13 121 lb 4.1 oz (55 kg)    Ideal Body Weight: 120 lbs  % Ideal Body Weight: 100%  Wt Readings from Last 10 Encounters:  06/27/13 121 lb 4.1 oz (55 kg)  02/01/13 121 lb 7.6 oz (55.1 kg)  09/21/12 119 lb 11.2 oz (54.296 kg)  01/04/12 132 lb 7.9 oz (60.1 kg)  12/16/11 136 lb 1.6 oz (61.735 kg)  11/03/11 142 lb 6.7 oz (64.6 kg)  10/14/11 135 lb 12.9 oz (61.6 kg)  06/02/11 143 lb 9.6 oz (65.137 kg)  03/11/11 136 lb 1.6 oz (61.735 kg)  02/21/11 136 lb 4.8 oz (61.825 kg)    Usual Body Weight: 160 lbs 2 years ago  % Usual Body Weight: 75%  BMI:  Body mass index is 20.33 kg/(m^2).  Estimated Nutritional Needs: Kcal: 1375-1540 Protein: 55-65g Fluid: ~1.5 L/day  Skin: intact  Diet Order: Cardiac  EDUCATION NEEDS: -Education needs addressed   Intake/Output Summary (Last 24 hours) at 06/27/13 1607 Last data filed at 06/27/13 1100  Gross per 24 hour  Intake    670 ml  Output    300 ml  Net    370 ml    Last BM: PTA   Labs:   Recent Labs Lab 06/26/13 1541 06/27/13 0500  NA 138 139  K 3.6 3.5  CL 100 102  CO2 20 20  BUN 20 19  CREATININE 1.06 1.18*  CALCIUM 9.3 8.4  GLUCOSE 121* 117*    CBG (last 3)  No results found for this basename:  GLUCAP,  in the last 72 hours  Scheduled Meds: . calcium carbonate  2 tablet Oral BID  . carvedilol  3.125 mg Oral BID WC  . colchicine  0.6 mg Oral Daily  . furosemide  40 mg Intravenous Q12H  . isosorbide-hydrALAZINE  1 tablet Oral BID  . latanoprost  1 drop Both Eyes QHS  . potassium chloride SA  40 mEq Oral BID  . [START ON 06/28/2013] Rivaroxaban  15 mg Oral Q supper  . sodium chloride  3 mL Intravenous Q12H    Continuous Infusions: . diltiazem (CARDIZEM) infusion 15 mg/hr (06/27/13 1325)    Past Medical History  Diagnosis Date  . HTN (hypertension)   . AF (atrial fibrillation)     On Xarelto as of 11/2011  . Diastolic heart failure     Echo 5/11: There was moderate  concentric hypertrophy. Systolic function was vigorous. The best esimated ejection fraction was in the range of 65% to 70%. Wall motion was normal  . Gout   . RA (rheumatoid arthritis)   . Chronic renal insufficiency     Baseline approx: 1.7  . Glaucoma     No recent eye visits noted in EMR.   . Pulmonary artery hypertension     Per 5/11 echo, PA peak pressure: 69mm Hg  . Stroke   . Coronary artery disease     Past Surgical History  Procedure Laterality Date  . Cardiac catheterization  4/02     Minimal luminal irregularities without significant focal stenosis.  Marland Kitchen Extracapsular cataract extraction with intraocular lens  11/08    Right eye, Dr. Mitzi Davenport    Loyce Dys, MS RD LDN Clinical Inpatient Dietitian Pager: [phone] Weekend/After hours pager: [phone]

## 2013-06-27 NOTE — Progress Notes (Signed)
  Echocardiogram 2D Echocardiogram has been performed.  Kim [name] [name] 06/27/2013, 12:09 PM

## 2013-06-27 NOTE — Care Management Note (Addendum)
    Page 1 of 2   06/27/2013     10:10:22 AM   CARE MANAGEMENT NOTE 06/27/2013  Patient:  Kim [name], Kim [name]   Account Number:  [id number]  Date Initiated:  06/27/2013  Documentation initiated by:  Junius Creamer  Subjective/Objective Assessment:   adm w at fib, heart failure     Action/Plan:   lives w da, pcp dr alex wilson   Anticipated DC Date:     Anticipated DC Plan:        DC Planning Services  CM consult      [facility] Choice  HOME HEALTH   Choice offered to / List presented to:  C-1 Patient        HH arranged  HH-1 RN  HH-10 DISEASE MANAGEMENT  HH-4 NURSE'S AIDE  HH-6 SOCIAL WORKER      HH agency  [facility]   Status of service:   Medicare Important Message given?   (If response is "NO", the following Medicare IM given date fields will be blank) Date Medicare IM given:   Date Additional Medicare IM given:    Discharge Disposition:  HOME W HOME HEALTH SERVICES  Per UR Regulation:  Reviewed for med. necessity/level of care/duration of stay  If discussed at Long Length of Stay Meetings, dates discussed:    Comments:  8/28 0938 debbie Willow Reczek rn,bsn adm w heart failure screen.hx of [facility]. will follow and assist as pt progresses. spoke w pt. she as granda that lives w her and other close. she had ref to [facility] in past but felt hhpt would not help. she would like to get hhaide. she does not qualify for medicaid and does not have money to hire addit help. she is agreeable to hhrn and bath aid. will ask home sw to see if they know of any other resources that may be helpful. pt has arthritis in hands and feet. fam assists pt also.

## 2013-06-28 ENCOUNTER — Inpatient Hospital Stay (HOSPITAL_COMMUNITY): Payer: Medicare Other

## 2013-06-28 DIAGNOSIS — E43 Unspecified severe protein-calorie malnutrition: Secondary | ICD-10-CM | POA: Insufficient documentation

## 2013-06-28 LAB — BASIC METABOLIC PANEL
CO2: 21 mEq/L (ref 19–32)
Calcium: 8.4 mg/dL (ref 8.4–10.5)

## 2013-06-28 MED ORDER — NITROGLYCERIN IN D5W 200-5 MCG/ML-% IV SOLN
10.0000 ug/min | INTRAVENOUS | Status: DC
  Administered 2013-06-28: 10 ug/min via INTRAVENOUS
  Filled 2013-06-28: qty 250

## 2013-06-28 MED ORDER — PANTOPRAZOLE SODIUM 40 MG IV SOLR
40.0000 mg | Freq: Every day | INTRAVENOUS | Status: DC
  Filled 2013-06-28 (×2): qty 40

## 2013-06-28 MED ORDER — ALPRAZOLAM 0.25 MG PO TABS
0.2500 mg | ORAL_TABLET | Freq: Every evening | ORAL | Status: DC | PRN
  Administered 2013-06-28: 0.25 mg via ORAL
  Filled 2013-06-28 (×3): qty 1

## 2013-06-28 MED ORDER — DIGOXIN 0.25 MG/ML IJ SOLN
0.2500 mg | Freq: Once | INTRAMUSCULAR | Status: AC
  Administered 2013-06-28: 0.25 mg via INTRAVENOUS
  Filled 2013-06-28: qty 1

## 2013-06-28 MED ORDER — DILTIAZEM HCL 60 MG PO TABS
60.0000 mg | ORAL_TABLET | Freq: Four times a day (QID) | ORAL | Status: DC
  Administered 2013-06-28 – 2013-07-02 (×16): 60 mg via ORAL
  Filled 2013-06-28 (×20): qty 1

## 2013-06-28 MED ORDER — DOBUTAMINE IN D5W 4-5 MG/ML-% IV SOLN
2.5000 ug/kg/min | INTRAVENOUS | Status: DC
  Administered 2013-06-28: 2.532 ug/kg/min via INTRAVENOUS

## 2013-06-28 MED ORDER — CIPROFLOXACIN HCL 250 MG PO TABS
250.0000 mg | ORAL_TABLET | Freq: Every day | ORAL | Status: DC
  Administered 2013-06-28 – 2013-07-03 (×6): 250 mg via ORAL
  Filled 2013-06-28 (×7): qty 1

## 2013-06-28 MED ORDER — DOBUTAMINE IN D5W 4-5 MG/ML-% IV SOLN
INTRAVENOUS | Status: AC
  Administered 2013-06-28: 2.532 ug/kg/min via INTRAVENOUS
  Filled 2013-06-28: qty 250

## 2013-06-28 MED ORDER — ALBUMIN HUMAN 25 % IV SOLN
12.5000 g | Freq: Once | INTRAVENOUS | Status: DC
  Filled 2013-06-28: qty 50

## 2013-06-28 MED ORDER — METOLAZONE 5 MG PO TABS
5.0000 mg | ORAL_TABLET | Freq: Once | ORAL | Status: AC
  Administered 2013-06-28: 5 mg via ORAL
  Filled 2013-06-28: qty 1

## 2013-06-28 MED ORDER — CIPROFLOXACIN HCL 250 MG PO TABS: 250.0000 mg | ORAL_TABLET | Freq: Two times a day (BID) | ORAL | Status: DC

## 2013-06-28 MED ORDER — DEXTROSE 50 % IV SOLN
INTRAVENOUS | Status: AC
  Filled 2013-06-28: qty 50

## 2013-06-28 NOTE — Progress Notes (Signed)
Subjective:  Still short of breath. Refuses albumin to improve oncotic pressure and right heart filling.  Objective:  Vital Signs in the last 24 hours: Temp:  [98 F (36.7 C)-98.6 F (37 C)] 98.4 F (36.9 C) (08/29 1600) Pulse Rate:  [92-94] 92 (08/29 1500) Cardiac Rhythm:  [-] Atrial fibrillation (08/29 0730) Resp:  [19-32] 19 (08/29 1500) BP: (104-152)/(42-85) 150/70 mmHg (08/29 1500) SpO2:  [96 %-100 %] 96 % (08/29 1500) Weight:  [55.293 kg (121 lb 14.4 oz)] 55.293 kg (121 lb 14.4 oz) (08/29 0500)  Physical Exam: BP Readings from Last 1 Encounters:  06/28/13 150/70     Wt Readings from Last 1 Encounters:  06/28/13 55.293 kg (121 lb 14.4 oz)    Weight change: 0.862 kg (1 lb 14.4 oz)  HEENT: [place]/AT, Eyes-Brown, PERL, EOMI, Conjunctiva-Pale pink, Sclera-Non-icteric Neck: No JVD, No bruit, Trachea midline. Lungs:  Basal crackles and dullness, Bilateral. Cardiac:  Regular rhythm, normal S1 and S2, no S3. II/VI systolic murmur. Abdomen:  Soft, non-tender. Extremities:  No edema present. No cyanosis. No clubbing.Gouty swelling of both hands and right forearm with deformity CNS: AxOx3, Cranial nerves grossly intact, moves all 4 extremities. Right handed. Skin: Warm and dry.   Intake/Output from previous day: 08/28 0701 - 08/29 0700 In: 1407.3 [P.O.:820; I.V.:587.3] Out: -     Lab Results: BMET    Component Value Date/Time   NA 135 06/28/2013 0425   K 4.9 06/28/2013 0425   CL 102 06/28/2013 0425   CO2 21 06/28/2013 0425   GLUCOSE 94 06/28/2013 0425   BUN 26* 06/28/2013 0425   CREATININE 1.65* 06/28/2013 0425   CREATININE 1.65* 12/16/2011 1725   CALCIUM 8.4 06/28/2013 0425   GFRNONAA 28* 06/28/2013 0425   GFRAA 32* 06/28/2013 0425   CBC    Component Value Date/Time   WBC 8.3 06/26/2013 1541   RBC 3.35* 06/26/2013 1541   RBC 3.58* 11/04/2011 0620   HGB 10.1* 06/26/2013 1541   HCT 30.9* 06/26/2013 1541   PLT 425* 06/26/2013 1541   MCV 92.2 06/26/2013 1541   MCH 30.1 06/26/2013  1541   MCHC 32.7 06/26/2013 1541   RDW 16.5* 06/26/2013 1541   LYMPHSABS 1.0 01/31/2013 0951   MONOABS 0.9 01/31/2013 0951   EOSABS 0.2 01/31/2013 0951   BASOSABS 0.0 01/31/2013 0951   CARDIAC ENZYMES Lab Results  Component Value Date   CKTOTAL 106 10/14/2011   CKMB 2.6 10/14/2011   TROPONINI <0.30 01/30/2013    Scheduled Meds: . albumin human  12.5 g Intravenous Once  . calcium carbonate  2 tablet Oral BID  . carvedilol  3.125 mg Oral BID WC  . ciprofloxacin  250 mg Oral Q breakfast  . colchicine  0.6 mg Oral Daily  . digoxin  0.25 mg Intravenous Once  . diltiazem  60 mg Oral Q6H  . feeding supplement  237 mL Oral BID BM  . furosemide  40 mg Intravenous Q12H  . isosorbide-hydrALAZINE  1 tablet Oral BID  . latanoprost  1 drop Both Eyes QHS  . metolazone  5 mg Oral Once  . pantoprazole (PROTONIX) IV  40 mg Intravenous q1800  . potassium chloride SA  40 mEq Oral BID  . Rivaroxaban  15 mg Oral Q supper  . sodium chloride  3 mL Intravenous Q12H   Continuous Infusions: . diltiazem (CARDIZEM) infusion 10 mg/hr (06/28/13 0427)  . DOBUTamine 2.532 mcg/kg/min (06/28/13 1740)   PRN Meds:.sodium chloride, acetaminophen, acetaminophen, ALPRAZolam, ondansetron (ZOFRAN) IV, sodium chloride  Assessment/Plan:  Acute systolic and diastolic heart failure  Severe right heart failure with bilateral Pleural effusions Severe pulmonary hypertension Atrial fibrillation with rapid ventricular response-improving.  Acute on chronic blood loss anemia  CKD, II  Chronic diastolic heart failure  Chronic pulmonary fibrosis  Chronic pulmonary hypertension  Rheumatoid arthritis and gout  Add zaroxolyn Add dobutamine drip Patient refuses albumin intake.   LOS: 2 days    Orpah Cobb  MD  06/28/2013, 5:59 PM

## 2013-06-28 NOTE — Progress Notes (Signed)
Late entry: Subjective:  Some shortness of breath continues.  Objective:  Vital Signs in the last 24 hours: Temp:  [98 F (36.7 C)-98.8 F (37.1 C)] 98 F (36.7 C) (08/29 0800) Pulse Rate:  [90-94] 93 (08/29 0600) Cardiac Rhythm:  [-] Atrial fibrillation (08/29 0730) Resp:  [18-32] 26 (08/29 0800) BP: (92-152)/(34-85) 152/81 mmHg (08/29 0800) SpO2:  [96 %-100 %] 97 % (08/29 0800) Weight:  [55.293 kg (121 lb 14.4 oz)] 55.293 kg (121 lb 14.4 oz) (08/29 0500)  Physical Exam: BP Readings from Last 1 Encounters:  06/28/13 152/81     Wt Readings from Last 1 Encounters:  06/28/13 55.293 kg (121 lb 14.4 oz)    Weight change: 0.862 kg (1 lb 14.4 oz)  HEENT: [place]/AT, Eyes-Brown, PERL, EOMI, Conjunctiva-Pale pink, Sclera-Non-icteric Neck: No JVD, No bruit, Trachea midline. Lungs:  Clear, Bilateral. Cardiac:  Regular rhythm, normal S1 and S2, no S3. II/VI systolic function. Abdomen:  Soft, non-tender. Extremities:  No edema present. No cyanosis. No clubbing. Gouty swelling of both hands and right forearm with deformity. CNS: AxOx3, Cranial nerves grossly intact, moves all 4 extremities. Right handed. Skin: Warm and dry.   Intake/Output from previous day: 08/28 0701 - 08/29 0700 In: 1407.3 [P.O.:820; I.V.:587.3] Out: -     Lab Results: BMET    Component Value Date/Time   NA 135 06/28/2013 0425   K 4.9 06/28/2013 0425   CL 102 06/28/2013 0425   CO2 21 06/28/2013 0425   GLUCOSE 94 06/28/2013 0425   BUN 26* 06/28/2013 0425   CREATININE 1.65* 06/28/2013 0425   CREATININE 1.65* 12/16/2011 1725   CALCIUM 8.4 06/28/2013 0425   GFRNONAA 28* 06/28/2013 0425   GFRAA 32* 06/28/2013 0425   CBC    Component Value Date/Time   WBC 8.3 06/26/2013 1541   RBC 3.35* 06/26/2013 1541   RBC 3.58* 11/04/2011 0620   HGB 10.1* 06/26/2013 1541   HCT 30.9* 06/26/2013 1541   PLT 425* 06/26/2013 1541   MCV 92.2 06/26/2013 1541   MCH 30.1 06/26/2013 1541   MCHC 32.7 06/26/2013 1541   RDW 16.5* 06/26/2013 1541   LYMPHSABS 1.0 01/31/2013 0951   MONOABS 0.9 01/31/2013 0951   EOSABS 0.2 01/31/2013 0951   BASOSABS 0.0 01/31/2013 0951   CARDIAC ENZYMES Lab Results  Component Value Date   CKTOTAL 106 10/14/2011   CKMB 2.6 10/14/2011   TROPONINI <0.30 01/30/2013    Scheduled Meds: . albumin human  12.5 g Intravenous Once  . calcium carbonate  2 tablet Oral BID  . carvedilol  3.125 mg Oral BID WC  . ciprofloxacin  250 mg Oral Q breakfast  . colchicine  0.6 mg Oral Daily  . diltiazem  60 mg Oral Q6H  . feeding supplement  237 mL Oral BID BM  . furosemide  40 mg Intravenous Q12H  . isosorbide-hydrALAZINE  1 tablet Oral BID  . latanoprost  1 drop Both Eyes QHS  . potassium chloride SA  40 mEq Oral BID  . Rivaroxaban  15 mg Oral Q supper  . sodium chloride  3 mL Intravenous Q12H   Continuous Infusions: . diltiazem (CARDIZEM) infusion 10 mg/hr (06/28/13 0427)   PRN Meds:.sodium chloride, acetaminophen, acetaminophen, ondansetron (ZOFRAN) IV, sodium chloride  Assessment/Plan:  Acute systolic and diastolic heart failure  Atrial fibrillation with rapid ventricular response.  Acute on chronic blood loss anemia  CKD, II  Chronic diastolic heart failure  Chronic pulmonary fibrosis  Chronic pulmonary hypertension  Rheumatoid arthritis and gout  Continue medical treatment.   LOS: 1 days    Orpah Cobb  MD  06/28/2013, 9:45 AM

## 2013-06-28 NOTE — Progress Notes (Signed)
Received orders for Cipro 250 mg po BID.  Due to patient's CrCl < 30, will adjust to Cipro 250 mg daily.  Thanks!  Tad Moore, BCPS  Clinical Pharmacist Pager [phone]  06/28/2013 8:39 AM

## 2013-06-29 LAB — COMPREHENSIVE METABOLIC PANEL
ALT: 7 U/L (ref 0–35)
AST: 20 U/L (ref 0–37)
Albumin: 2.7 g/dL — ABNORMAL LOW (ref 3.5–5.2)
Alkaline Phosphatase: 106 U/L (ref 39–117)
Glucose, Bld: 73 mg/dL (ref 70–99)
Potassium: 4.6 mEq/L (ref 3.5–5.1)
Sodium: 138 mEq/L (ref 135–145)
Total Protein: 6.4 g/dL (ref 6.0–8.3)

## 2013-06-29 MED ORDER — ALPRAZOLAM 0.25 MG PO TABS
0.2500 mg | ORAL_TABLET | Freq: Once | ORAL | Status: AC
  Administered 2013-06-29: 0.25 mg via ORAL

## 2013-06-29 MED ORDER — PANTOPRAZOLE SODIUM 40 MG PO TBEC
40.0000 mg | DELAYED_RELEASE_TABLET | Freq: Every day | ORAL | Status: DC
  Administered 2013-06-29 – 2013-07-03 (×5): 40 mg via ORAL
  Filled 2013-06-29 (×6): qty 1

## 2013-06-29 NOTE — Progress Notes (Signed)
Elevated BP noted.  Patient is asymptomatic; hemodynamics stable. Dobutamine infusing per order.  Dr. Algie Coffer notified.  New orders for IV nitro given.  Will implement and continue to monitor.

## 2013-06-30 LAB — BASIC METABOLIC PANEL
BUN: 28 mg/dL — ABNORMAL HIGH (ref 6–23)
CO2: 26 mEq/L (ref 19–32)
Calcium: 8.9 mg/dL (ref 8.4–10.5)
Creatinine, Ser: 1.62 mg/dL — ABNORMAL HIGH (ref 0.50–1.10)
Glucose, Bld: 118 mg/dL — ABNORMAL HIGH (ref 70–99)

## 2013-06-30 MED ORDER — METOLAZONE 2.5 MG PO TABS
2.5000 mg | ORAL_TABLET | Freq: Every day | ORAL | Status: DC
  Administered 2013-06-30 – 2013-07-01 (×2): 2.5 mg via ORAL
  Filled 2013-06-30 (×3): qty 1

## 2013-06-30 NOTE — Progress Notes (Signed)
Subjective:  Another small improvement in breathing. Has severe pulmonary hypertension and has oxygen at home.  Objective:  Vital Signs in the last 24 hours: Temp:  [97.4 F (36.3 C)-98.4 F (36.9 C)] 98 F (36.7 C) (08/31 0800) Pulse Rate:  [92-95] 93 (08/31 0405) Cardiac Rhythm:  [-] Atrial fibrillation (08/30 2000) Resp:  [14-24] 14 (08/31 0800) BP: (119-162)/(51-74) 152/74 mmHg (08/31 0800) SpO2:  [99 %-100 %] 100 % (08/31 0800) Weight:  [54.9 kg (121 lb 0.5 oz)] 54.9 kg (121 lb 0.5 oz) (08/31 0500)  Physical Exam: BP Readings from Last 1 Encounters:  06/30/13 152/74     Wt Readings from Last 1 Encounters:  06/30/13 54.9 kg (121 lb 0.5 oz)    Weight change: 1.103 kg (2 lb 6.9 oz)  HEENT: [place]/AT, Eyes-Brown, PERL, EOMI, Conjunctiva-Pale pink, Sclera-Non-icteric Neck: No JVD, No bruit, Trachea midline. Lungs:  Clear, Bilateral. Cardiac:  Regular rhythm, normal S1 and S2, no S3.  Abdomen:  Soft, non-tender. Extremities:  No edema present. No cyanosis. No clubbing. Gouty swelling of both hands and right forearm with deformity. CNS: AxOx3, Cranial nerves grossly intact, moves all 4 extremities. Right handed. Skin: Warm and dry.   Intake/Output from previous day: 08/30 0701 - 08/31 0700 In: 435.2 [I.V.:435.2] Out: -     Lab Results: BMET    Component Value Date/Time   NA 138 06/29/2013 0540   K 4.6 06/29/2013 0540   CL 101 06/29/2013 0540   CO2 25 06/29/2013 0540   GLUCOSE 73 06/29/2013 0540   BUN 28* 06/29/2013 0540   CREATININE 1.68* 06/29/2013 0540   CREATININE 1.65* 12/16/2011 1725   CALCIUM 8.9 06/29/2013 0540   GFRNONAA 27* 06/29/2013 0540   GFRAA 32* 06/29/2013 0540   CBC    Component Value Date/Time   WBC 8.3 06/26/2013 1541   RBC 3.35* 06/26/2013 1541   RBC 3.58* 11/04/2011 0620   HGB 10.1* 06/26/2013 1541   HCT 30.9* 06/26/2013 1541   PLT 425* 06/26/2013 1541   MCV 92.2 06/26/2013 1541   MCH 30.1 06/26/2013 1541   MCHC 32.7 06/26/2013 1541   RDW 16.5* 06/26/2013  1541   LYMPHSABS 1.0 01/31/2013 0951   MONOABS 0.9 01/31/2013 0951   EOSABS 0.2 01/31/2013 0951   BASOSABS 0.0 01/31/2013 0951   CARDIAC ENZYMES Lab Results  Component Value Date   CKTOTAL 106 10/14/2011   CKMB 2.6 10/14/2011   TROPONINI <0.30 01/30/2013    Scheduled Meds: . albumin human  12.5 g Intravenous Once  . calcium carbonate  2 tablet Oral BID  . carvedilol  3.125 mg Oral BID WC  . ciprofloxacin  250 mg Oral Q breakfast  . colchicine  0.6 mg Oral Daily  . diltiazem  60 mg Oral Q6H  . feeding supplement  237 mL Oral BID BM  . furosemide  40 mg Intravenous Q12H  . isosorbide-hydrALAZINE  1 tablet Oral BID  . latanoprost  1 drop Both Eyes QHS  . metolazone  2.5 mg Oral Q breakfast  . pantoprazole  40 mg Oral Q1200  . potassium chloride SA  40 mEq Oral BID  . Rivaroxaban  15 mg Oral Q supper  . sodium chloride  3 mL Intravenous Q12H   Continuous Infusions: . diltiazem (CARDIZEM) infusion 10 mg/hr (06/28/13 0427)  . DOBUTamine 2.5 mcg/kg/min (06/30/13 0400)  . nitroGLYCERIN 10 mcg/min (06/30/13 0400)   PRN Meds:.sodium chloride, acetaminophen, acetaminophen, ALPRAZolam, ondansetron (ZOFRAN) IV, sodium chloride  Assessment/Plan: Acute systolic and diastolic heart  failure  Severe right heart failure with bilateral Pleural effusions  Severe pulmonary hypertension  Atrial fibrillation with rapid ventricular response-improving.  Acute on chronic blood loss anemia  CKD, II  Chronic diastolic heart failure  Chronic pulmonary fibrosis  Chronic pulmonary hypertension  Rheumatoid arthritis and gout  Add zaroxolyn for 3 days.   LOS: 4 days    Orpah Cobb  MD  06/30/2013, 8:38 AM

## 2013-06-30 NOTE — Progress Notes (Signed)
Shift summary:  No acute events this shift.  Patient has been afebrile; VSS.  Denies pain this shift.  Dobutamine and nitro gtts infusing per order.  Patient verbalized that she "feels better" this shift.  Has been able to assist more with movement and feed herself this shift.  Patient has refused repositioning this shift and insists on lying on the right side.  States "I can't breath on my left side".  She has allow assistance with boosting in the bed.  Patient updated on plan of care.  Will continue to monitor.

## 2013-06-30 NOTE — Progress Notes (Signed)
Late entry. Subjective:  Small improvement in breathing. Severe pulmonary hypertension. Moderate MR, Severe TR and moderate LVH.  Objective:  Vital Signs in the last 24 hours: Temp:  [97.4 F (36.3 C)-98.4 F (36.9 C)] 98 F (36.7 C) (08/31 0800) Pulse Rate:  [92-95] 93 (08/31 0405) Cardiac Rhythm:  [-] Atrial fibrillation (08/30 2000) Resp:  [14-24] 14 (08/31 0800) BP: (119-162)/(51-74) 152/74 mmHg (08/31 0800) SpO2:  [99 %-100 %] 100 % (08/31 0800) Weight:  [54.9 kg (121 lb 0.5 oz)] 54.9 kg (121 lb 0.5 oz) (08/31 0500)  Physical Exam: BP Readings from Last 1 Encounters:  06/30/13 152/74     Wt Readings from Last 1 Encounters:  06/30/13 54.9 kg (121 lb 0.5 oz)    Weight change: 1.103 kg (2 lb 6.9 oz)  HEENT: [place]/AT, Eyes-Brown, PERL, EOMI, Conjunctiva-Pale pink, Sclera-Non-icteric Neck: No JVD, No bruit, Trachea midline. Lungs:  Clear, Bilateral. Cardiac:  Regular rhythm, normal S1 and S2, no S3. II/VI systolic murmur. Abdomen:  Soft, non-tender. Extremities: Gouty swelling of both hands and right forearm with deformity. No significant edema present. No cyanosis. No clubbing. CNS: AxOx3, Cranial nerves grossly intact, moves all 4 extremities. Right handed. Skin: Warm and dry.   Intake/Output from previous day: 08/30 0701 - 08/31 0700 In: 435.2 [I.V.:435.2] Out: -     Lab Results: BMET    Component Value Date/Time   NA 138 06/29/2013 0540   K 4.6 06/29/2013 0540   CL 101 06/29/2013 0540   CO2 25 06/29/2013 0540   GLUCOSE 73 06/29/2013 0540   BUN 28* 06/29/2013 0540   CREATININE 1.68* 06/29/2013 0540   CREATININE 1.65* 12/16/2011 1725   CALCIUM 8.9 06/29/2013 0540   GFRNONAA 27* 06/29/2013 0540   GFRAA 32* 06/29/2013 0540   CBC    Component Value Date/Time   WBC 8.3 06/26/2013 1541   RBC 3.35* 06/26/2013 1541   RBC 3.58* 11/04/2011 0620   HGB 10.1* 06/26/2013 1541   HCT 30.9* 06/26/2013 1541   PLT 425* 06/26/2013 1541   MCV 92.2 06/26/2013 1541   MCH 30.1 06/26/2013 1541    MCHC 32.7 06/26/2013 1541   RDW 16.5* 06/26/2013 1541   LYMPHSABS 1.0 01/31/2013 0951   MONOABS 0.9 01/31/2013 0951   EOSABS 0.2 01/31/2013 0951   BASOSABS 0.0 01/31/2013 0951   CARDIAC ENZYMES Lab Results  Component Value Date   CKTOTAL 106 10/14/2011   CKMB 2.6 10/14/2011   TROPONINI <0.30 01/30/2013    Scheduled Meds: . albumin human  12.5 g Intravenous Once  . calcium carbonate  2 tablet Oral BID  . carvedilol  3.125 mg Oral BID WC  . ciprofloxacin  250 mg Oral Q breakfast  . colchicine  0.6 mg Oral Daily  . diltiazem  60 mg Oral Q6H  . feeding supplement  237 mL Oral BID BM  . furosemide  40 mg Intravenous Q12H  . isosorbide-hydrALAZINE  1 tablet Oral BID  . latanoprost  1 drop Both Eyes QHS  . metolazone  2.5 mg Oral Daily  . pantoprazole  40 mg Oral Q1200  . potassium chloride SA  40 mEq Oral BID  . Rivaroxaban  15 mg Oral Q supper  . sodium chloride  3 mL Intravenous Q12H   Continuous Infusions: . diltiazem (CARDIZEM) infusion 10 mg/hr (06/28/13 0427)  . DOBUTamine 2.5 mcg/kg/min (06/30/13 0400)  . nitroGLYCERIN 10 mcg/min (06/30/13 0400)   PRN Meds:.sodium chloride, acetaminophen, acetaminophen, ALPRAZolam, ondansetron (ZOFRAN) IV, sodium chloride  Assessment/Plan: Acute systolic  and diastolic heart failure  Severe right heart failure with bilateral Pleural effusions  Severe pulmonary hypertension  Atrial fibrillation with rapid ventricular response-improving.  Acute on chronic blood loss anemia  CKD, II  Chronic diastolic heart failure  Chronic pulmonary fibrosis  Chronic pulmonary hypertension  Rheumatoid arthritis and gout  Continue medical treatment.   LOS: 3 days    Orpah Cobb  MD  06/30/2013, 8:32 AM

## 2013-07-01 ENCOUNTER — Inpatient Hospital Stay (HOSPITAL_COMMUNITY): Payer: Medicare Other

## 2013-07-01 LAB — CBC
MCH: 29.6 pg (ref 26.0–34.0)
MCV: 93.4 fL (ref 78.0–100.0)
Platelets: 363 10*3/uL (ref 150–400)
RDW: 16.2 % — ABNORMAL HIGH (ref 11.5–15.5)

## 2013-07-01 LAB — BASIC METABOLIC PANEL
BUN: 27 mg/dL — ABNORMAL HIGH (ref 6–23)
Calcium: 8.8 mg/dL (ref 8.4–10.5)
Creatinine, Ser: 1.6 mg/dL — ABNORMAL HIGH (ref 0.50–1.10)
GFR calc Af Amer: 33 mL/min — ABNORMAL LOW (ref >90)

## 2013-07-01 MED ORDER — MECLIZINE HCL 12.5 MG PO TABS
12.5000 mg | ORAL_TABLET | Freq: Two times a day (BID) | ORAL | Status: DC | PRN
  Administered 2013-07-01: 12.5 mg via ORAL
  Filled 2013-07-01: qty 1

## 2013-07-01 NOTE — Progress Notes (Signed)
Complained of feeling dizzy, meclizine given, cold compress to forehead applied with relief after few min. Continue to monitor.

## 2013-07-01 NOTE — Progress Notes (Signed)
Subjective:  Feeling weak and dizzy. No chest pain. X-ray abdomen is unremarkable. Chest x-ray shows improved aeration.  Objective:  Vital Signs in the last 24 hours: Temp:  [97.4 F (36.3 C)-98.6 F (37 C)] 97.4 F (36.3 C) (09/01 0745) Pulse Rate:  [91-93] 92 (09/01 0800) Cardiac Rhythm:  [-] Junctional rhythm (09/01 1100) Resp:  [13-23] 20 (09/01 0800) BP: (129-156)/(50-70) 148/58 mmHg (09/01 0800) SpO2:  [98 %-100 %] 100 % (09/01 0800) Weight:  [53.6 kg (118 lb 2.7 oz)] 53.6 kg (118 lb 2.7 oz) (09/01 0500)  Physical Exam: BP Readings from Last 1 Encounters:  07/01/13 148/58     Wt Readings from Last 1 Encounters:  07/01/13 53.6 kg (118 lb 2.7 oz)    Weight change: -1.3 kg (-2 lb 13.9 oz)  HEENT: [place]/AT, Eyes-Brown, PERL, EOMI, Conjunctiva-Pale, Sclera-Non-icteric Neck: No JVD, No bruit, Trachea midline. Lungs:  Clear, Bilateral. Cardiac:  Regular rhythm, normal S1 and S2, no S3.  Abdomen:  Soft, non-tender. Extremities:  No edema present. No cyanosis. No clubbing. Significant bilateral hand deformity as before. CNS: AxOx3, Cranial nerves grossly intact, moves all 4 extremities. Right handed. Skin: Warm and dry.   Intake/Output from previous day: 08/31 0701 - 09/01 0700 In: 1567.7 [P.O.:1150; I.V.:417.7] Out: -     Lab Results: BMET    Component Value Date/Time   NA 130* 07/01/2013 0415   K 4.7 07/01/2013 0415   CL 92* 07/01/2013 0415   CO2 26 07/01/2013 0415   GLUCOSE 123* 07/01/2013 0415   BUN 27* 07/01/2013 0415   CREATININE 1.60* 07/01/2013 0415   CREATININE 1.65* 12/16/2011 1725   CALCIUM 8.8 07/01/2013 0415   GFRNONAA 29* 07/01/2013 0415   GFRAA 33* 07/01/2013 0415   CBC    Component Value Date/Time   WBC 7.2 07/01/2013 0415   RBC 2.87* 07/01/2013 0415   RBC 3.58* 11/04/2011 0620   HGB 8.5* 07/01/2013 0415   HCT 26.8* 07/01/2013 0415   PLT 363 07/01/2013 0415   MCV 93.4 07/01/2013 0415   MCH 29.6 07/01/2013 0415   MCHC 31.7 07/01/2013 0415   RDW 16.2* 07/01/2013 0415   LYMPHSABS  1.0 01/31/2013 0951   MONOABS 0.9 01/31/2013 0951   EOSABS 0.2 01/31/2013 0951   BASOSABS 0.0 01/31/2013 0951   CARDIAC ENZYMES Lab Results  Component Value Date   CKTOTAL 106 10/14/2011   CKMB 2.6 10/14/2011   TROPONINI <0.30 01/30/2013    Scheduled Meds: . calcium carbonate  2 tablet Oral BID  . carvedilol  3.125 mg Oral BID WC  . ciprofloxacin  250 mg Oral Q breakfast  . colchicine  0.6 mg Oral Daily  . diltiazem  60 mg Oral Q6H  . feeding supplement  237 mL Oral BID BM  . furosemide  40 mg Intravenous Q12H  . isosorbide-hydrALAZINE  1 tablet Oral BID  . latanoprost  1 drop Both Eyes QHS  . pantoprazole  40 mg Oral Q1200  . Rivaroxaban  15 mg Oral Q supper  . sodium chloride  3 mL Intravenous Q12H   Continuous Infusions: . DOBUTamine 2.5 mcg/kg/min (07/01/13 0600)   PRN Meds:.sodium chloride, acetaminophen, acetaminophen, ALPRAZolam, meclizine, ondansetron (ZOFRAN) IV, sodium chloride  Assessment/Plan: Acute systolic and diastolic heart failure  Severe right heart failure with bilateral Pleural effusions  Severe pulmonary hypertension  Atrial fibrillation with rapid ventricular response-improving.  Acute on chronic blood loss anemia  CKD, II  Chronic diastolic heart failure  Chronic pulmonary fibrosis  Chronic pulmonary hypertension  Rheumatoid arthritis and gout Hyponatremia-Zaroxolyn induced  Adjust medications. Discontinue NTG drip and diitiazem drip. Increase activity. Stop zaroxolyn.   LOS: 5 days    Orpah Cobb  MD  07/01/2013, 11:12 AM     Feeling weak and dizzy

## 2013-07-01 NOTE — Progress Notes (Signed)
Patient reports feeling intermittent dizziness throughout the night.  Reports that within the last hour it has been sustained and she feels that "the room is spinning" and that "she's going to pass out".  No significant changes in vitals noted.  Dobutamine and nitro gtts infusing per order with no changes.  Dr. Algie Coffer called and updated.  New orders for xray of abdomen and chest given.  Will order and continue to monitor.

## 2013-07-02 LAB — BASIC METABOLIC PANEL
Calcium: 8.9 mg/dL (ref 8.4–10.5)
Creatinine, Ser: 1.71 mg/dL — ABNORMAL HIGH (ref 0.50–1.10)
GFR calc non Af Amer: 27 mL/min — ABNORMAL LOW (ref >90)
Glucose, Bld: 83 mg/dL (ref 70–99)
Sodium: 133 mEq/L — ABNORMAL LOW (ref 135–145)

## 2013-07-02 LAB — MRSA PCR SCREENING: MRSA by PCR: NEGATIVE

## 2013-07-02 MED ORDER — FUROSEMIDE 40 MG PO TABS
40.0000 mg | ORAL_TABLET | Freq: Two times a day (BID) | ORAL | Status: DC
  Administered 2013-07-02 – 2013-07-04 (×5): 40 mg via ORAL
  Filled 2013-07-02 (×7): qty 1

## 2013-07-02 MED ORDER — BIOTENE DRY MOUTH MT LIQD
15.0000 mL | Freq: Two times a day (BID) | OROMUCOSAL | Status: DC
  Administered 2013-07-02 – 2013-07-04 (×4): 15 mL via OROMUCOSAL

## 2013-07-02 MED ORDER — DILTIAZEM HCL 60 MG PO TABS
120.0000 mg | ORAL_TABLET | Freq: Two times a day (BID) | ORAL | Status: DC
  Administered 2013-07-02 – 2013-07-04 (×4): 120 mg via ORAL
  Filled 2013-07-02 (×5): qty 2

## 2013-07-02 MED ORDER — POTASSIUM CHLORIDE CRYS ER 10 MEQ PO TBCR
10.0000 meq | EXTENDED_RELEASE_TABLET | Freq: Two times a day (BID) | ORAL | Status: DC
  Administered 2013-07-02 – 2013-07-04 (×5): 10 meq via ORAL
  Filled 2013-07-02 (×6): qty 1

## 2013-07-02 NOTE — Progress Notes (Signed)
Subjective:  Do not want SNF. Afebrile. Has help at home /patient.  Objective:  Vital Signs in the last 24 hours: Temp:  [97.3 F (36.3 C)-98.8 F (37.1 C)] 98.5 F (36.9 C) (09/02 0800) Pulse Rate:  [89-98] 98 (09/02 0800) Cardiac Rhythm:  [-] Atrial fibrillation (09/02 0000) Resp:  [15-30] 25 (09/02 0800) BP: (131-158)/(50-72) 146/72 mmHg (09/02 0800) SpO2:  [100 %] 100 % (09/02 0800) Weight:  [51.7 kg (113 lb 15.7 oz)] 51.7 kg (113 lb 15.7 oz) (09/02 0500)  Physical Exam: BP Readings from Last 1 Encounters:  07/02/13 146/72     Wt Readings from Last 1 Encounters:  07/02/13 51.7 kg (113 lb 15.7 oz)    Weight change: -1.9 kg (-4 lb 3 oz)  HEENT: [place]/AT, Eyes-Brown, PERL, EOMI, Conjunctiva-Pale, Sclera-Non-icteric Neck: No JVD, No bruit, Trachea midline. Lungs:  Clear, Bilateral. Cardiac:  Regular rhythm, normal S1 and S2, no S3.  Abdomen:  Soft, non-tender. Extremities:  No edema present. No cyanosis. No clubbing. Bilateral gouty swellings of both hands. CNS: AxOx3, Cranial nerves grossly intact, moves all 4 extremities. Right handed. Skin: Warm and dry.   Intake/Output from previous day: 09/01 0701 - 09/02 0700 In: 962.4 [P.O.:660; I.V.:302.4] Out: -     Lab Results: BMET    Component Value Date/Time   NA 133* 07/02/2013 0448   K 3.6 07/02/2013 0448   CL 91* 07/02/2013 0448   CO2 31 07/02/2013 0448   GLUCOSE 83 07/02/2013 0448   BUN 27* 07/02/2013 0448   CREATININE 1.71* 07/02/2013 0448   CREATININE 1.65* 12/16/2011 1725   CALCIUM 8.9 07/02/2013 0448   GFRNONAA 27* 07/02/2013 0448   GFRAA 31* 07/02/2013 0448   CBC    Component Value Date/Time   WBC 7.2 07/01/2013 0415   RBC 2.87* 07/01/2013 0415   RBC 3.58* 11/04/2011 0620   HGB 8.5* 07/01/2013 0415   HCT 26.8* 07/01/2013 0415   PLT 363 07/01/2013 0415   MCV 93.4 07/01/2013 0415   MCH 29.6 07/01/2013 0415   MCHC 31.7 07/01/2013 0415   RDW 16.2* 07/01/2013 0415   LYMPHSABS 1.0 01/31/2013 0951   MONOABS 0.9 01/31/2013 0951   EOSABS 0.2  01/31/2013 0951   BASOSABS 0.0 01/31/2013 0951   CARDIAC ENZYMES Lab Results  Component Value Date   CKTOTAL 106 10/14/2011   CKMB 2.6 10/14/2011   TROPONINI <0.30 01/30/2013    Scheduled Meds: . antiseptic oral rinse  15 mL Mouth Rinse BID  . calcium carbonate  2 tablet Oral BID  . carvedilol  3.125 mg Oral BID WC  . ciprofloxacin  250 mg Oral Q breakfast  . colchicine  0.6 mg Oral Daily  . diltiazem  120 mg Oral Q12H  . feeding supplement  237 mL Oral BID BM  . furosemide  40 mg Oral BID  . isosorbide-hydrALAZINE  1 tablet Oral BID  . latanoprost  1 drop Both Eyes QHS  . pantoprazole  40 mg Oral Q1200  . potassium chloride  10 mEq Oral BID  . Rivaroxaban  15 mg Oral Q supper  . sodium chloride  3 mL Intravenous Q12H   Continuous Infusions:  PRN Meds:.sodium chloride, acetaminophen, acetaminophen, ALPRAZolam, meclizine, ondansetron (ZOFRAN) IV, sodium chloride  Assessment/Plan: Acute systolic and diastolic heart failure  Severe right heart failure with bilateral Pleural effusions  Severe pulmonary hypertension  Atrial fibrillation with rapid ventricular response-improving.  Acute on chronic blood loss anemia  CKD, II  Chronic diastolic heart failure  Chronic pulmonary  fibrosis  Chronic pulmonary hypertension  Rheumatoid arthritis and gout  Hyponatremia-Zaroxolyn induced-improving  Will try bedside commode prior to discharge home DC dobutamine drip. Change diltiazem to bid dosing and IV lasix to PO.    LOS: 6 days    Orpah Cobb  MD  07/02/2013, 9:10 AM

## 2013-07-03 LAB — BASIC METABOLIC PANEL
Chloride: 89 mEq/L — ABNORMAL LOW (ref 96–112)
GFR calc Af Amer: 30 mL/min — ABNORMAL LOW (ref >90)
Potassium: 4 mEq/L (ref 3.5–5.1)
Sodium: 132 mEq/L — ABNORMAL LOW (ref 135–145)

## 2013-07-03 NOTE — Progress Notes (Signed)
Pt arrived to unit, pt oriented to room and unit, pt a/o no c/o pain, call bell given to pt, pt ate well for dinner, pt stable, will continue to monitor

## 2013-07-03 NOTE — Evaluation (Signed)
Physical Therapy Evaluation Patient Details Name: Kim [name] MRN: [id number] DOB: 1928-12-07 Today's Date: 07/03/2013 Time: 4132-4401 PT Time Calculation (min): 22 min  PT Assessment / Plan / Recommendation History of Present Illness  79 years old female with past history of hypertension, atrial fibrillation, gout, rheumatoid arthritis, chronic renal insufficiency, coronary artery disease and pulmonary artery hypertension has shortness of breath x 2 days.  Clinical Impression  Pt severely deconditioned requiring assist for all mobility and ADLs. Pt reports having assist at home however have been unable to reach family to clarify. Have not seen family in room either. Pt refusing SNF con't reporting "I'll do better at home." Pt educated on benefit of SNF to allow pt to achieve maximal functional recovery. Pt adamant about returning home. Pt to benefit from HHPT if pt d/c'd home. PT reports she has all equip.    PT Assessment  Patient needs continued PT services    Follow Up Recommendations  SNF;Supervision/Assistance - 24 hour (however pt is refusing, will need HHPT)    Does the patient have the potential to tolerate intense rehabilitation      Barriers to Discharge  (questionable assist at home)      Equipment Recommendations   (pt reports she has all equipment)    Recommendations for Other Services     Frequency Min 3X/week    Precautions / Restrictions Precautions Precautions: Fall Restrictions Weight Bearing Restrictions: No   Pertinent Vitals/Pain Pt reports hand and foot pain due to gout       Mobility  Bed Mobility Bed Mobility: Supine to Sit;Sit to Supine Supine to Sit: 4: Min assist;With rails;HOB flat Sit to Supine: 4: Min assist;HOB flat;With rail Details for Bed Mobility Assistance: assist for LE managment, minA for trunk elevation Transfers Transfers: Sit to Stand;Stand to Sit;Stand Pivot Transfers Sit to Stand: 2: Max assist;With upper extremity  assist;From bed Stand to Sit: With upper extremity assist;3: Mod assist;With armrests;To chair/3-in-1 Stand Pivot Transfers: 1: +2 Total assist Stand Pivot Transfers: Patient Percentage: 50% Details for Transfer Assistance: attempted to use RW but pt unable to stand upright. pt presents extremely deconditioned Ambulation/Gait Ambulation/Gait Assistance: Not tested (comment) (pt reports "I can't walk. The MD knows I can't")    Exercises     PT Diagnosis: Difficulty walking;Generalized weakness  PT Problem List: Decreased strength;Decreased activity tolerance;Decreased balance;Decreased mobility PT Treatment Interventions: DME instruction;Gait training;Functional mobility training;Therapeutic activities;Therapeutic exercise     PT Goals(Current goals can be found in the care plan section) Acute Rehab PT Goals Patient Stated Goal: "i'm going home." PT Goal Formulation: With patient Time For Goal Achievement: 07/17/13 Potential to Achieve Goals: Good  Visit Information  Last PT Received On: 07/03/13 Assistance Needed: +2 History of Present Illness: 79 years old female with past history of hypertension, atrial fibrillation, gout, rheumatoid arthritis, chronic renal insufficiency, coronary artery disease and pulmonary artery hypertension has shortness of breath x 2 days.       Prior Functioning  Home Living Family/patient expects to be discharged to:: Private residence Living Arrangements: Children Available Help at Discharge: Family;Available 24 hours/day Type of Home: House Home Access: Ramped entrance Home Layout: One level Home Equipment: Walker - 2 wheels;Bedside commode;Shower seat;Wheelchair - manual Prior Function Level of Independence: Needs assistance Gait / Transfers Assistance Needed: pt hasn't amb since 01/30/13. pt reports only std pvt to [facility] with assist ADL's / Homemaking Assistance Needed: assist for all adls, pt receives sponge baths Communication / Swallowing  Assistance Needed: no Communication  Communication: No difficulties Dominant Hand: Right    Cognition  Cognition Arousal/Alertness: Awake/alert Behavior During Therapy: WFL for tasks assessed/performed Overall Cognitive Status: Within Functional Limits for tasks assessed    Extremity/Trunk Assessment Upper Extremity Assessment Upper Extremity Assessment: RUE deficits/detail;LUE deficits/detail RUE Deficits / Details: noted RA in hands, generalized weakness LUE Deficits / Details: noted RA in hands, generalized weakness Lower Extremity Assessment Lower Extremity Assessment: Generalized weakness Cervical / Trunk Assessment Cervical / Trunk Assessment: Normal   Balance Balance Balance Assessed: Yes Static Sitting Balance Static Sitting - Balance Support: Bilateral upper extremity supported Static Sitting - Level of Assistance: 5: Stand by assistance Static Sitting - Comment/# of Minutes: 3  End of Session PT - End of Session Equipment Utilized During Treatment: Gait belt Activity Tolerance: Patient limited by fatigue Patient left: in bed;with call bell/phone within reach Nurse Communication: Mobility status  GP     Marcene Brawn 07/03/2013, 3:19 PM  Lewis Shock, PT, DPT Pager #: [phone] Office #: [phone]

## 2013-07-03 NOTE — Progress Notes (Signed)
Subjective:  Feeling better but weakness persist. Poor efforts to transfer to bedside commode.  Objective:  Vital Signs in the last 24 hours: Temp:  [97.7 F (36.5 C)-98.6 F (37 C)] 97.7 F (36.5 C) (09/03 0808) Pulse Rate:  [90-111] 95 (09/03 0808) Cardiac Rhythm:  [-] Atrial fibrillation (09/03 0400) Resp:  [12-24] 12 (09/03 0400) BP: (123-169)/(39-78) 144/63 mmHg (09/03 0808) SpO2:  [100 %] 100 % (09/03 0808) Weight:  [51.1 kg (112 lb 10.5 oz)] 51.1 kg (112 lb 10.5 oz) (09/03 0500)  Physical Exam: BP Readings from Last 1 Encounters:  07/03/13 144/63     Wt Readings from Last 1 Encounters:  07/03/13 51.1 kg (112 lb 10.5 oz)    Weight change: -0.6 kg (-1 lb 5.2 oz)  HEENT: [place]/AT, Eyes-Brown, PERL, EOMI, Conjunctiva-Pink, Sclera-Non-icteric Neck: No JVD, No bruit, Trachea midline. Lungs:  Clear, Bilateral. Cardiac:  Regular rhythm, normal S1 and S2, no S3.  Abdomen:  Soft, non-tender. Extremities:  No edema present. No cyanosis. No clubbing. Bilateral hand swelling from gout. CNS: AxOx3, Cranial nerves grossly intact, moves all 4 extremities. Right handed. Skin: Warm and dry.   Intake/Output from previous day: 09/02 0701 - 09/03 0700 In: 1176.3 [P.O.:1140; I.V.:36.3] Out: 150 [Urine:150]    Lab Results: BMET    Component Value Date/Time   NA 133* 07/02/2013 0448   K 3.6 07/02/2013 0448   CL 91* 07/02/2013 0448   CO2 31 07/02/2013 0448   GLUCOSE 83 07/02/2013 0448   BUN 27* 07/02/2013 0448   CREATININE 1.71* 07/02/2013 0448   CREATININE 1.65* 12/16/2011 1725   CALCIUM 8.9 07/02/2013 0448   GFRNONAA 27* 07/02/2013 0448   GFRAA 31* 07/02/2013 0448   CBC    Component Value Date/Time   WBC 7.2 07/01/2013 0415   RBC 2.87* 07/01/2013 0415   RBC 3.58* 11/04/2011 0620   HGB 8.5* 07/01/2013 0415   HCT 26.8* 07/01/2013 0415   PLT 363 07/01/2013 0415   MCV 93.4 07/01/2013 0415   MCH 29.6 07/01/2013 0415   MCHC 31.7 07/01/2013 0415   RDW 16.2* 07/01/2013 0415   LYMPHSABS 1.0 01/31/2013 0951   MONOABS  0.9 01/31/2013 0951   EOSABS 0.2 01/31/2013 0951   BASOSABS 0.0 01/31/2013 0951   CARDIAC ENZYMES Lab Results  Component Value Date   CKTOTAL 106 10/14/2011   CKMB 2.6 10/14/2011   TROPONINI <0.30 01/30/2013    Scheduled Meds: . antiseptic oral rinse  15 mL Mouth Rinse BID  . calcium carbonate  2 tablet Oral BID  . carvedilol  3.125 mg Oral BID WC  . ciprofloxacin  250 mg Oral Q breakfast  . colchicine  0.6 mg Oral Daily  . diltiazem  120 mg Oral Q12H  . feeding supplement  237 mL Oral BID BM  . furosemide  40 mg Oral BID  . isosorbide-hydrALAZINE  1 tablet Oral BID  . latanoprost  1 drop Both Eyes QHS  . pantoprazole  40 mg Oral Q1200  . potassium chloride  10 mEq Oral BID  . Rivaroxaban  15 mg Oral Q supper  . sodium chloride  3 mL Intravenous Q12H   Continuous Infusions:  PRN Meds:.sodium chloride, acetaminophen, acetaminophen, ALPRAZolam, meclizine, ondansetron (ZOFRAN) IV, sodium chloride  Assessment/Plan: Acute systolic and diastolic heart failure  Severe right heart failure with bilateral Pleural effusions  Severe pulmonary hypertension  Atrial fibrillation with rapid ventricular response-improving.  Acute on chronic blood loss anemia  CKD, II  Chronic diastolic heart failure  Chronic  pulmonary fibrosis  Chronic pulmonary hypertension  Rheumatoid arthritis and gout  Hyponatremia-Zaroxolyn induced-improving  Increase activity before discharge home. Call me when family at bed side.   LOS: 7 days    Orpah Cobb  MD  07/03/2013, 9:19 AM

## 2013-07-04 MED ORDER — MECLIZINE HCL 12.5 MG PO TABS: 12.5000 mg | ORAL_TABLET | Freq: Two times a day (BID) | ORAL | Status: AC | PRN

## 2013-07-04 MED ORDER — POTASSIUM CHLORIDE CRYS ER 20 MEQ PO TBCR: 20.0000 meq | EXTENDED_RELEASE_TABLET | Freq: Every day | ORAL | Status: AC

## 2013-07-04 MED ORDER — ISOSORB DINITRATE-HYDRALAZINE 20-37.5 MG PO TABS: 1.0000 | ORAL_TABLET | Freq: Two times a day (BID) | ORAL | Status: AC

## 2013-07-04 MED ORDER — ENSURE COMPLETE PO LIQD: 237.0000 mL | Freq: Two times a day (BID) | ORAL | Status: AC

## 2013-07-04 MED ORDER — PANTOPRAZOLE SODIUM 40 MG PO TBEC: 40.0000 mg | DELAYED_RELEASE_TABLET | Freq: Every day | ORAL | Status: AC

## 2013-07-04 MED ORDER — METOPROLOL SUCCINATE ER 50 MG PO TB24: 50.0000 mg | ORAL_TABLET | Freq: Every day | ORAL | Status: AC

## 2013-07-04 MED ORDER — ALPRAZOLAM 0.25 MG PO TABS: 0.2500 mg | ORAL_TABLET | Freq: Every evening | ORAL | Status: AC | PRN

## 2013-07-04 MED ORDER — DILTIAZEM HCL ER BEADS 240 MG PO CP24: 240.0000 mg | ORAL_CAPSULE | Freq: Every day | ORAL | Status: AC

## 2013-07-04 NOTE — Discharge Summary (Signed)
Physician Discharge Summary  Patient ID: Kim [name] MRN: [id number] DOB/AGE: [birth year] 79 y.o.  Admit date: 06/26/2013 Discharge date: 07/04/2013  Admission Diagnoses: Acute systolic and diastolic heart failure  Severe right heart failure with bilateral Pleural effusions  Severe pulmonary hypertension  Atrial fibrillation with rapid ventricular respons.  Acute on chronic blood loss anemia  CKD, II  Chronic diastolic heart failure  Chronic pulmonary fibrosis  Chronic pulmonary hypertension  Rheumatoid arthritis and gout    Discharge Diagnoses:  Principle Problem: * Acute systolic and diastolic heart failure * Severe right heart failure with bilateral Pleural effusions  Severe, chronic pulmonary hypertension  Atrial fibrillation with rapid ventricular response  Acute on chronic blood loss anemia  CKD, II  Protein-calorie malnutrition, severe Chronic diastolic heart failure  Chronic pulmonary fibrosis  Rheumatoid arthritis and gout  Positional vertigo Hyponatremia  Discharged Condition: stable  Hospital Course: 79 years old female with past history of hypertension, atrial fibrillation, gout, rheumatoid arthritis, chronic renal insufficiency, coronary artery disease and pulmonary artery hypertension had shortness of breath x 2 days. She had gradual diuresis with lasix and dobutamine drip and occasional use of Zaroxolyn. She was offered Nurse aid and physical therapy but she and caregiver at home(son) declined the assistance. A nurse may check on her condition for few days to see if family is able to care for her and gets her medications. Hopefully family members will come together and provide the care that patient is telling me she is cared for at home until now. Her prognosis is poor with and without 24 hour care or SNF from advanced stage heart disease and malnutrition.   Consults: cardiology  Significant Diagnostic Studies: labs: WBC 8.3 K, Hgb-8.5 to 10.1, Platelets  425K, Near normal electrolytes with mild hyponatremia and mildly elevated BUN/CR to 27/1.75. TSH-4.2, albumin low at 2.7,BNP-3679  EKG-Atrial fibrillation, with rapid ventricular response and lateral ischemia and next day, A. Fibrillation with controlled ventricular response.  CXR on 06/26/2013-moderate CHF with right greater than left pleural effusions.  CXR on 07/01/2013-Overall improved aeration. Some mild vascular congestion remains  Echocardiogram: - Left ventricle: The cavity size was normal. There was moderate concentric hypertrophy. Systolic function was normal. The estimated ejection fraction was in the range of 60% to 65%. Wall motion was normal; there were no regional wall motion abnormalities. Doppler parameters are consistent with abnormal left ventricular relaxation (grade 1 diastolic dysfunction). - Mitral valve: Calcified annulus. Moderate regurgitation. - Right ventricle: The cavity size was mildly dilated. Wall thickness was normal. Systolic function was severely reduced. - Tricuspid valve: Severe regurgitation. - Pulmonary arteries: Systolic pressure was severely increased. PA peak pressure: 84mm Hg (S). - Pericardium, extracardiac: There was a right pleural effusion. There was a left pleural effusion.   Treatments: cardiac meds: metoprolol, furosemide, Imdur, Xarelto and diltiazem. Dobutamine drip for 3 days.  Discharge Exam: Blood pressure 125/50, pulse 92, temperature 97.8 F (36.6 C), temperature source Oral, resp. rate 16, height 5' 4.75" (1.645 m), weight 49.8 kg (109 lb 12.6 oz), SpO2 100.00%. HEENT: [place]/AT, Eyes-Brown, PERL, EOMI, Conjunctiva-Pink, Sclera-Non-icteric  Neck: No JVD, No bruit, Trachea midline.  Lungs: Clear, Bilateral.  Cardiac: Regular rhythm, normal S1 and S2, no S3.  Abdomen: Soft, non-tender.  Extremities: No edema present. No cyanosis. No clubbing. Bilateral hand swelling from gout.  CNS: AxOx3, Cranial nerves grossly intact, moves all 4  extremities. Right handed.  Skin: Warm and dry.  Disposition: 01-Home or Self Care     Medication List  STOP taking these medications       cloNIDine 0.2 MG tablet  Commonly known as:  CATAPRES      TAKE these medications       acetaminophen 325 MG tablet  Commonly known as:  TYLENOL  Take 325 mg by mouth every 6 (six) hours as needed. pain     ALPRAZolam 0.25 MG tablet  Commonly known as:  XANAX  Take 1 tablet (0.25 mg total) by mouth at bedtime as needed for anxiety.     calcium carbonate 500 MG chewable tablet  Commonly known as:  TUMS - dosed in mg elemental calcium  Chew 2 tablets (400 mg of elemental calcium total) by mouth 2 (two) times daily.     colchicine 0.6 MG tablet  Take 1 tablet (0.6 mg total) by mouth daily.     diltiazem 240 MG 24 hr capsule  Commonly known as:  TIAZAC  Take 1 capsule (240 mg total) by mouth daily.     feeding supplement Liqd  Take 237 mLs by mouth 2 (two) times daily between meals.     furosemide 40 MG tablet  Commonly known as:  LASIX  Take 1 tablet (40 mg total) by mouth 2 (two) times daily.     isosorbide-hydrALAZINE 20-37.5 MG per tablet  Commonly known as:  BIDIL  Take 1 tablet by mouth 2 (two) times daily.     latanoprost 0.005 % ophthalmic solution  Commonly known as:  XALATAN  Place 1 drop into both eyes at bedtime.     meclizine 12.5 MG tablet  Commonly known as:  ANTIVERT  Take 1 tablet (12.5 mg total) by mouth 2 (two) times daily as needed for dizziness.     metoprolol succinate 50 MG 24 hr tablet  Commonly known as:  TOPROL-XL  Take 1 tablet (50 mg total) by mouth daily.     pantoprazole 40 MG tablet  Commonly known as:  PROTONIX  Take 1 tablet (40 mg total) by mouth daily at 12 noon.     potassium chloride SA 20 MEQ tablet  Commonly known as:  K-DUR,KLOR-CON  Take 1 tablet (20 mEq total) by mouth daily.     XARELTO 15 MG Tabs tablet  Generic drug:  Rivaroxaban  take 1 tablet by mouth once daily          Signed: Leib Elahi S 07/04/2013, 9:58 AM

## 2013-07-04 NOTE — Progress Notes (Signed)
Pt d/c to home via ambulance.  Pt's granddaughter accompanied her.  Amanda Pea, Charity fundraiser.

## 2013-07-04 NOTE — Progress Notes (Signed)
All d/c instructions explained and given to pt.  Verbalized understanding. .  Patte Winkel, RN. 

## 2013-07-04 NOTE — Clinical Social Work Psychosocial (Signed)
     Clinical Social Work Department BRIEF PSYCHOSOCIAL ASSESSMENT 07/04/2013  Patient:  Kim [name], Kim [name]     Account Number:  [id number]     Admit date:  06/26/2013  Clinical Social Worker:  Tiburcio Pea  Date/Time:  07/04/2013 11:37 AM  Referred by:  Physician  Date Referred:  07/03/2013 Referred for  SNF Placement   Other Referral:   Interview type:  Other - See comment Other interview type:   Pagteint and granddauther Kim [name]    PSYCHOSOCIAL DATA Living Status:  FAMILY Admitted from facility:   Level of care:   Primary support name:  Kim [name]  [phone] Primary support relationship to patient:  CHILD, ADULT Degree of support available:   Good support    CURRENT CONCERNS Current Concerns  None Noted   Other Concerns:    SOCIAL WORK ASSESSMENT / PLAN 79 year old female admitted from home.  Physical Therapy recommended SNF placement but patient adamantly refuses. She plans to return home with help of daughter granddaughters.  She is reluctant to accept home health services as she is worried about copay amounts.  Patient did agree to accept [facility]- for RN visits.  RNCM has arranged with [facility]. Patient and family requests home transport via EMS.  Services arranged.   Assessment/plan status:  No Further Intervention Required Other assessment/ plan:   Information/referral to community resources:   RNCM arranged Home health  Patient refused SNF placement    PATIENTS/FAMILYS RESPONSE TO PLAN OF CARE: Patient is alert and oriented; very pleasant lady who is anxious to return home.  She feels that she will have support of her family to assist her at home. Home Health has been arranged by [facility].  Patient's granddaughter Kim [name] confirmed above but stated that her grandmother would benefit from more home health services but she is fearful of copays due to limited income.  No futher CSW need identified. CSW signing off.  Kim [name]. Kim [name], LCSWA [phone]

## 2013-07-04 NOTE — Progress Notes (Signed)
07/04/13 1053 Noted pt. to dc home today with family.  Pt. refuses SNF placement, and only wants home health RN to come to check on her.  TC to Lupita Leash, with [facility] to make aware of dc today. Tera Mater, RN, BSN NCM [phone]

## 2013-07-04 NOTE — Evaluation (Signed)
Occupational Therapy Evaluation Patient Details Name: Kim [name] MRN: [id number] DOB: Oct 11, 1929 Today's Date: 07/04/2013 Time: 2956-2130 OT Time Calculation (min): 40 min  OT Assessment / Plan / Recommendation History of present illness 79 years old female with past history of hypertension, atrial fibrillation, gout, rheumatoid arthritis, chronic renal insufficiency, coronary artery disease and pulmonary artery hypertension has shortness of breath x 2 days.   Clinical Impression   Pt presents with dependence in all areas of ADL and requires max assistance for OOB.  At home, pt is alone with intermittent assist of her family.  Pt stays in bed all day and only transfers to and from [facility].  Granddaughter arrived at end of session and voiced concern about pt's safety at home and inconsistency with family's ability to help with her care.  Pt is unlikely to ask for assist of her family per granddaughter.  At this time, pt is declining home health services and SNF.  Will follow acutely.    OT Assessment  Patient needs continued OT Services    Follow Up Recommendations  SNF (pt is declining all post acute rehab, pt needs 24 hour care at home)    Barriers to Discharge Decreased caregiver support granddaughter is concerned about pt's safety  Equipment Recommendations   (pt wants a hospital bed for home if covered by insurance)    Recommendations for Other Services    Frequency  Min 2X/week    Precautions / Restrictions Precautions Precautions: Fall Restrictions Weight Bearing Restrictions: No   Pertinent Vitals/Pain No c/o pain    ADL  Eating/Feeding: Set up (uses both right and left hands) Where Assessed - Eating/Feeding: Bed level Grooming: Wash/dry hands;Wash/dry face;Set up Where Assessed - Grooming: Unsupported sitting Upper Body Bathing: Moderate assistance Where Assessed - Upper Body Bathing: Unsupported sitting Lower Body Bathing: +1 Total assistance Where Assessed -  Lower Body Bathing: Rolling right and/or left Upper Body Dressing: Minimal assistance Where Assessed - Upper Body Dressing: Unsupported sitting Lower Body Dressing: +1 Total assistance Where Assessed - Lower Body Dressing: Unsupported sitting Toilet Transfer: Maximal assistance Toilet Transfer Method: Stand pivot Toilet Transfer Equipment: Bedside commode Toileting - Clothing Manipulation and Hygiene: Supervision/safety Equipment Used: Gait belt Transfers/Ambulation Related to ADLs: pivoted without standing upright    OT Diagnosis: Generalized weakness  OT Problem List: Decreased strength;Decreased activity tolerance;Impaired balance (sitting and/or standing);Decreased safety awareness;Impaired UE functional use OT Treatment Interventions: Self-care/ADL training;Patient/family education;Balance training   OT Goals(Current goals can be found in the care plan section) Acute Rehab OT Goals Patient Stated Goal: "i'm going home." OT Goal Formulation: With patient Time For Goal Achievement: 07/18/13 Potential to Achieve Goals: Fair ADL Goals Pt Will Transfer to Toilet: with min guard assist;squat pivot transfer;bedside commode Pt Will Perform Toileting - Clothing Manipulation and hygiene: with supervision;sitting/lateral leans Additional ADL Goal #1: Pt will perform supine <> sit with modified independence from a flat surface.  Visit Information  Last OT Received On: 07/04/13 Assistance Needed: +2 History of Present Illness: 79 years old female with past history of hypertension, atrial fibrillation, gout, rheumatoid arthritis, chronic renal insufficiency, coronary artery disease and pulmonary artery hypertension has shortness of breath x 2 days.       Prior Functioning     Home Living Family/patient expects to be discharged to:: Private residence Living Arrangements: Alone Available Help at Discharge: Family;Available 24 hours/day Type of Home: House Home Access: Ramped  entrance Home Layout: One level Home Equipment: Walker - 4 wheels;Bedside commode;Wheelchair - manual;Shower  seat Additional Comments: pt wants a hospital bed Prior Function Level of Independence: Needs assistance Gait / Transfers Assistance Needed: pt hasn't amb since 01/30/13. pt reports only std pvt to [facility] with assist ADL's / Homemaking Assistance Needed: assist for all adls, pt receives sponge baths Communication / Swallowing Assistance Needed: no Communication Communication: No difficulties Dominant Hand: Right         Vision/Perception Vision - History Baseline Vision: Wears glasses only for reading Visual History: Glaucoma Patient Visual Report: No change from baseline   Cognition  Cognition Arousal/Alertness: Awake/alert Behavior During Therapy: WFL for tasks assessed/performed Overall Cognitive Status: Within Functional Limits for tasks assessed    Extremity/Trunk Assessment Upper Extremity Assessment Upper Extremity Assessment: RUE deficits/detail;LUE deficits/detail RUE Deficits / Details: noted RA in hands, generalized weakness RUE Coordination: decreased fine motor LUE Deficits / Details: noted RA in hands, generalized weakness LUE Coordination: decreased fine motor Lower Extremity Assessment Lower Extremity Assessment: Defer to PT evaluation Cervical / Trunk Assessment Cervical / Trunk Assessment: Normal     Mobility Bed Mobility Bed Mobility: Supine to Sit;Sit to Supine Supine to Sit: 4: Min assist;With rails;HOB flat Sit to Supine: 4: Min assist;HOB flat;With rail Details for Bed Mobility Assistance: assist for LEs  Transfers Transfers:  (attempted, cannot stand upright) Sit to Stand: 2: Max assist;With upper extremity assist;From bed;From chair/3-in-1 Stand to Sit: With upper extremity assist;3: Mod assist;To chair/3-in-1;To bed;With armrests Details for Transfer Assistance: pt unable to stand upright     Exercise     Balance Balance Balance  Assessed: Yes Static Sitting Balance Static Sitting - Balance Support: Feet supported Static Sitting - Level of Assistance: 5: Stand by assistance Static Sitting - Comment/# of Minutes: 10   End of Session OT - End of Session Activity Tolerance: Patient limited by fatigue Patient left: in bed;with call bell/phone within reach;with family/visitor present  GO     Evern Bio 07/04/2013, 9:42 AM [phone]

## 2013-07-04 NOTE — Progress Notes (Signed)
Pt's rhythm changed back to accelerated junctional which pt was in on  07-01-13, no s/s will continue to monitor, Thanks, Lavonda Jumbo RN

## 2013-07-05 ENCOUNTER — Encounter: Payer: Medicare Other | Admitting: Internal Medicine

## 2013-07-24 ENCOUNTER — Other Ambulatory Visit: Payer: Self-pay | Admitting: Internal Medicine

## 2013-07-29 NOTE — Telephone Encounter (Signed)
This patient has not been seen in over a year. From the recent telephone conversations, I gather that she possibly is continuing with [place], and she has probably been admitted to [facility] 2 times in the past 6 months. Even so, we need to see her for hospital follow up, and she needs to come in to the clinic if she wants to continue getting refills and continuity care. Please call the patient about this. If that is not possible I would request the PCP to consider sending her a letter to that effect.

## 2013-07-29 NOTE — Telephone Encounter (Signed)
Message sent to front office to schedule pt an appt. 

## 2013-07-30 ENCOUNTER — Encounter: Payer: Self-pay | Admitting: Internal Medicine

## 2013-08-16 ENCOUNTER — Other Ambulatory Visit: Payer: Self-pay | Admitting: Internal Medicine

## 2013-08-16 DIAGNOSIS — I4891 Unspecified atrial fibrillation: Secondary | ICD-10-CM

## 2013-08-16 DIAGNOSIS — M109 Gout, unspecified: Secondary | ICD-10-CM

## 2013-08-22 MED ORDER — COLCHICINE 0.6 MG PO TABS: 0.6000 mg | ORAL_TABLET | Freq: Every day | ORAL | Status: DC

## 2013-08-22 NOTE — Telephone Encounter (Signed)
Pt called about her Zarelto refill.  Thanks

## 2013-09-12 ENCOUNTER — Other Ambulatory Visit: Payer: Self-pay | Admitting: Internal Medicine

## 2013-09-13 NOTE — Telephone Encounter (Signed)
Pt last seen in [facility] 12/2011. No showed or cancelled next three appt. Will give 30 days supply but pt must make and keep appt to receive additional refills.

## 2013-09-21 ENCOUNTER — Encounter (HOSPITAL_COMMUNITY): Payer: Self-pay | Admitting: Emergency Medicine

## 2013-09-21 ENCOUNTER — Emergency Department (HOSPITAL_COMMUNITY): Payer: Medicare Other

## 2013-09-21 ENCOUNTER — Inpatient Hospital Stay (HOSPITAL_COMMUNITY)
Admission: EM | Admit: 2013-09-21 | Discharge: 2013-09-25 | DRG: 545 | Disposition: A | Discharge status: Home Health | Payer: Medicare Other | Attending: Internal Medicine | Admitting: Internal Medicine

## 2013-09-21 DIAGNOSIS — R001 Bradycardia, unspecified: Secondary | ICD-10-CM

## 2013-09-21 DIAGNOSIS — Z8249 Family history of ischemic heart disease and other diseases of the circulatory system: Secondary | ICD-10-CM

## 2013-09-21 DIAGNOSIS — D500 Iron deficiency anemia secondary to blood loss (chronic): Secondary | ICD-10-CM | POA: Diagnosis present

## 2013-09-21 DIAGNOSIS — E872 Acidosis, unspecified: Secondary | ICD-10-CM | POA: Diagnosis present

## 2013-09-21 DIAGNOSIS — I2510 Atherosclerotic heart disease of native coronary artery without angina pectoris: Secondary | ICD-10-CM | POA: Diagnosis present

## 2013-09-21 DIAGNOSIS — Z8679 Personal history of other diseases of the circulatory system: Secondary | ICD-10-CM | POA: Insufficient documentation

## 2013-09-21 DIAGNOSIS — N179 Acute kidney failure, unspecified: Secondary | ICD-10-CM

## 2013-09-21 DIAGNOSIS — Z833 Family history of diabetes mellitus: Secondary | ICD-10-CM

## 2013-09-21 DIAGNOSIS — I272 Pulmonary hypertension, unspecified: Secondary | ICD-10-CM

## 2013-09-21 DIAGNOSIS — J90 Pleural effusion, not elsewhere classified: Secondary | ICD-10-CM | POA: Diagnosis present

## 2013-09-21 DIAGNOSIS — M109 Gout, unspecified: Secondary | ICD-10-CM | POA: Diagnosis present

## 2013-09-21 DIAGNOSIS — I4430 Unspecified atrioventricular block: Secondary | ICD-10-CM | POA: Diagnosis present

## 2013-09-21 DIAGNOSIS — M1990 Unspecified osteoarthritis, unspecified site: Secondary | ICD-10-CM | POA: Diagnosis present

## 2013-09-21 DIAGNOSIS — Z8673 Personal history of transient ischemic attack (TIA), and cerebral infarction without residual deficits: Secondary | ICD-10-CM

## 2013-09-21 DIAGNOSIS — I2789 Other specified pulmonary heart diseases: Secondary | ICD-10-CM | POA: Diagnosis present

## 2013-09-21 DIAGNOSIS — J811 Chronic pulmonary edema: Secondary | ICD-10-CM

## 2013-09-21 DIAGNOSIS — Z79899 Other long term (current) drug therapy: Secondary | ICD-10-CM

## 2013-09-21 DIAGNOSIS — R609 Edema, unspecified: Secondary | ICD-10-CM

## 2013-09-21 DIAGNOSIS — I509 Heart failure, unspecified: Secondary | ICD-10-CM | POA: Diagnosis present

## 2013-09-21 DIAGNOSIS — I495 Sick sinus syndrome: Secondary | ICD-10-CM | POA: Diagnosis present

## 2013-09-21 DIAGNOSIS — J8410 Pulmonary fibrosis, unspecified: Secondary | ICD-10-CM | POA: Diagnosis present

## 2013-09-21 DIAGNOSIS — M25561 Pain in right knee: Secondary | ICD-10-CM

## 2013-09-21 DIAGNOSIS — Z823 Family history of stroke: Secondary | ICD-10-CM

## 2013-09-21 DIAGNOSIS — D649 Anemia, unspecified: Secondary | ICD-10-CM | POA: Diagnosis present

## 2013-09-21 DIAGNOSIS — Z87891 Personal history of nicotine dependence: Secondary | ICD-10-CM

## 2013-09-21 DIAGNOSIS — R7989 Other specified abnormal findings of blood chemistry: Secondary | ICD-10-CM

## 2013-09-21 DIAGNOSIS — I4891 Unspecified atrial fibrillation: Secondary | ICD-10-CM

## 2013-09-21 DIAGNOSIS — I959 Hypotension, unspecified: Secondary | ICD-10-CM

## 2013-09-21 DIAGNOSIS — I5033 Acute on chronic diastolic (congestive) heart failure: Secondary | ICD-10-CM

## 2013-09-21 DIAGNOSIS — E875 Hyperkalemia: Secondary | ICD-10-CM | POA: Diagnosis present

## 2013-09-21 DIAGNOSIS — Z7409 Other reduced mobility: Secondary | ICD-10-CM | POA: Diagnosis present

## 2013-09-21 DIAGNOSIS — I129 Hypertensive chronic kidney disease with stage 1 through stage 4 chronic kidney disease, or unspecified chronic kidney disease: Secondary | ICD-10-CM | POA: Diagnosis present

## 2013-09-21 DIAGNOSIS — I5032 Chronic diastolic (congestive) heart failure: Secondary | ICD-10-CM | POA: Diagnosis present

## 2013-09-21 DIAGNOSIS — M7989 Other specified soft tissue disorders: Secondary | ICD-10-CM

## 2013-09-21 DIAGNOSIS — IMO0002 Reserved for concepts with insufficient information to code with codable children: Secondary | ICD-10-CM

## 2013-09-21 DIAGNOSIS — M069 Rheumatoid arthritis, unspecified: Principal | ICD-10-CM

## 2013-09-21 DIAGNOSIS — N189 Chronic kidney disease, unspecified: Secondary | ICD-10-CM | POA: Diagnosis present

## 2013-09-21 DIAGNOSIS — D638 Anemia in other chronic diseases classified elsewhere: Secondary | ICD-10-CM | POA: Diagnosis present

## 2013-09-21 DIAGNOSIS — E8729 Other acidosis: Secondary | ICD-10-CM | POA: Diagnosis present

## 2013-09-21 DIAGNOSIS — Z7901 Long term (current) use of anticoagulants: Secondary | ICD-10-CM

## 2013-09-21 DIAGNOSIS — H409 Unspecified glaucoma: Secondary | ICD-10-CM | POA: Diagnosis present

## 2013-09-21 DIAGNOSIS — Z66 Do not resuscitate: Secondary | ICD-10-CM | POA: Diagnosis not present

## 2013-09-21 LAB — BASIC METABOLIC PANEL
BUN: 26 mg/dL — ABNORMAL HIGH (ref 6–23)
CO2: 16 mEq/L — ABNORMAL LOW (ref 19–32)
CO2: 11 mEq/L — ABNORMAL LOW (ref 19–32)
Calcium: 8.4 mg/dL (ref 8.4–10.5)
Chloride: 101 mEq/L (ref 96–112)
Chloride: 98 mEq/L (ref 96–112)
Chloride: 102 mEq/L (ref 96–112)
GFR calc Af Amer: 61 mL/min — ABNORMAL LOW (ref >90)
GFR calc Af Amer: 50 mL/min — ABNORMAL LOW (ref >90)
GFR calc non Af Amer: 43 mL/min — ABNORMAL LOW (ref >90)
Potassium: 5.5 mEq/L — ABNORMAL HIGH (ref 3.5–5.1)
Potassium: 6.5 mEq/L (ref 3.5–5.1)
Potassium: 5.8 mEq/L — ABNORMAL HIGH (ref 3.5–5.1)
Sodium: 135 mEq/L (ref 135–145)
Sodium: 134 mEq/L — ABNORMAL LOW (ref 135–145)
Sodium: 135 mEq/L (ref 135–145)

## 2013-09-21 LAB — CBC WITH DIFFERENTIAL/PLATELET
Eosinophils Relative: 1 % (ref 0–5)
HCT: 27.4 % — ABNORMAL LOW (ref 36.0–46.0)
Lymphocytes Relative: 10 % — ABNORMAL LOW (ref 12–46)
Lymphs Abs: 1 10*3/uL (ref 0.7–4.0)
MCV: 89.3 fL (ref 78.0–100.0)
Monocytes Relative: 10 % (ref 3–12)
Neutro Abs: 7.8 10*3/uL — ABNORMAL HIGH (ref 1.7–7.7)
RBC: 3.07 MIL/uL — ABNORMAL LOW (ref 3.87–5.11)
WBC: 9.9 10*3/uL (ref 4.0–10.5)

## 2013-09-21 LAB — URINALYSIS, ROUTINE W REFLEX MICROSCOPIC
Glucose, UA: NEGATIVE mg/dL
Ketones, ur: NEGATIVE mg/dL
Protein, ur: 30 mg/dL — AB
Specific Gravity, Urine: 1.03 (ref 1.005–1.030)
Urobilinogen, UA: 1 mg/dL (ref 0.0–1.0)

## 2013-09-21 LAB — PRO B NATRIURETIC PEPTIDE: Pro B Natriuretic peptide (BNP): 1389 pg/mL — ABNORMAL HIGH (ref 0–450)

## 2013-09-21 LAB — URINE MICROSCOPIC-ADD ON

## 2013-09-21 LAB — POCT I-STAT TROPONIN I: Troponin i, poc: 0.02 ng/mL (ref 0.00–0.08)

## 2013-09-21 LAB — MAGNESIUM: Magnesium: 1.8 mg/dL (ref 1.5–2.5)

## 2013-09-21 LAB — ACETAMINOPHEN LEVEL: Acetaminophen (Tylenol), Serum: <15 ug/mL (ref 10–30)

## 2013-09-21 LAB — SEDIMENTATION RATE: Sed Rate: 82 mm/hr — ABNORMAL HIGH (ref 0–22)

## 2013-09-21 LAB — SALICYLATE LEVEL: Salicylate Lvl: <2 mg/dL — ABNORMAL LOW (ref 2.8–20.0)

## 2013-09-21 MED ORDER — HYDROCODONE-ACETAMINOPHEN 5-325 MG PO TABS
1.0000 | ORAL_TABLET | Freq: Three times a day (TID) | ORAL | Status: DC | PRN
  Administered 2013-09-22 – 2013-09-25 (×5): 1 via ORAL
  Filled 2013-09-21 (×7): qty 1

## 2013-09-21 MED ORDER — DEXTROSE 50 % IV SOLN
50.0000 mL | Freq: Once | INTRAVENOUS | Status: AC
  Administered 2013-09-21: 50 mL via INTRAVENOUS
  Filled 2013-09-21: qty 50

## 2013-09-21 MED ORDER — SODIUM CHLORIDE 0.9 % IV BOLUS (SEPSIS)
500.0000 mL | Freq: Once | INTRAVENOUS | Status: AC
  Administered 2013-09-21: 1000 mL via INTRAVENOUS

## 2013-09-21 MED ORDER — SODIUM POLYSTYRENE SULFONATE 15 GM/60ML PO SUSP: 15.0000 g | Freq: Once | ORAL | Status: DC

## 2013-09-21 MED ORDER — SODIUM POLYSTYRENE SULFONATE 15 GM/60ML PO SUSP
15.0000 g | Freq: Once | ORAL | Status: AC
  Administered 2013-09-22: 15 g via ORAL
  Filled 2013-09-21: qty 60

## 2013-09-21 MED ORDER — SODIUM CHLORIDE 0.9 % IV BOLUS (SEPSIS)
2000.0000 mL | Freq: Once | INTRAVENOUS | Status: AC
  Administered 2013-09-21: 2000 mL via INTRAVENOUS

## 2013-09-21 MED ORDER — ONDANSETRON HCL 4 MG/2ML IJ SOLN
4.0000 mg | Freq: Once | INTRAMUSCULAR | Status: AC
  Administered 2013-09-21: 4 mg via INTRAVENOUS
  Filled 2013-09-21: qty 2

## 2013-09-21 MED ORDER — VANCOMYCIN HCL IN DEXTROSE 1-5 GM/200ML-% IV SOLN
1000.0000 mg | Freq: Once | INTRAVENOUS | Status: AC
  Administered 2013-09-21: 1000 mg via INTRAVENOUS
  Filled 2013-09-21: qty 200

## 2013-09-21 MED ORDER — SODIUM BICARBONATE 8.4 % IV SOLN
50.0000 meq | Freq: Once | INTRAVENOUS | Status: AC
  Administered 2013-09-21: 50 meq via INTRAVENOUS
  Filled 2013-09-21: qty 50

## 2013-09-21 MED ORDER — KETOROLAC TROMETHAMINE 30 MG/ML IJ SOLN
30.0000 mg | Freq: Once | INTRAMUSCULAR | Status: AC
  Administered 2013-09-21: 30 mg via INTRAVENOUS
  Filled 2013-09-21: qty 1

## 2013-09-21 MED ORDER — PANTOPRAZOLE SODIUM 40 MG PO TBEC
40.0000 mg | DELAYED_RELEASE_TABLET | Freq: Every day | ORAL | Status: DC
  Administered 2013-09-23 – 2013-09-25 (×3): 40 mg via ORAL
  Filled 2013-09-21 (×3): qty 1

## 2013-09-21 MED ORDER — FUROSEMIDE 40 MG PO TABS
40.0000 mg | ORAL_TABLET | Freq: Two times a day (BID) | ORAL | Status: DC
  Administered 2013-09-22 – 2013-09-23 (×4): 40 mg via ORAL
  Filled 2013-09-21 (×7): qty 1

## 2013-09-21 MED ORDER — INSULIN ASPART 100 UNIT/ML IV SOLN
10.0000 [IU] | INTRAVENOUS | Status: AC
  Administered 2013-09-21: 10 [IU] via INTRAVENOUS
  Filled 2013-09-21: qty 0.1

## 2013-09-21 MED ORDER — PIPERACILLIN-TAZOBACTAM 3.375 G IVPB 30 MIN
3.3750 g | Freq: Once | INTRAVENOUS | Status: AC
  Administered 2013-09-21: 3.375 g via INTRAVENOUS
  Filled 2013-09-21: qty 50

## 2013-09-21 MED ORDER — ALPRAZOLAM 0.25 MG PO TABS
0.2500 mg | ORAL_TABLET | Freq: Every evening | ORAL | Status: DC | PRN
  Administered 2013-09-21 – 2013-09-24 (×4): 0.25 mg via ORAL
  Filled 2013-09-21 (×4): qty 1

## 2013-09-21 MED ORDER — LATANOPROST 0.005 % OP SOLN
1.0000 [drp] | Freq: Every day | OPHTHALMIC | Status: DC
  Administered 2013-09-21 – 2013-09-24 (×4): 1 [drp] via OPHTHALMIC
  Filled 2013-09-21 (×2): qty 2.5

## 2013-09-21 MED ORDER — DILTIAZEM HCL ER BEADS 240 MG PO CP24
240.0000 mg | ORAL_CAPSULE | Freq: Every day | ORAL | Status: DC
  Filled 2013-09-21: qty 1

## 2013-09-21 MED ORDER — ATROPINE SULFATE 1 MG/ML IJ SOLN
0.5000 mg | Freq: Once | INTRAMUSCULAR | Status: AC
  Administered 2013-09-21: 0.5 mg via INTRAVENOUS

## 2013-09-21 MED ORDER — ATROPINE SULFATE 0.1 MG/ML IJ SOLN
0.5000 mg | INTRAMUSCULAR | Status: DC | PRN
  Filled 2013-09-21: qty 5

## 2013-09-21 MED ORDER — METHYLPREDNISOLONE SODIUM SUCC 125 MG IJ SOLR
125.0000 mg | Freq: Once | INTRAMUSCULAR | Status: AC
  Administered 2013-09-21: 125 mg via INTRAVENOUS
  Filled 2013-09-21: qty 2

## 2013-09-21 MED ORDER — ACETAMINOPHEN 325 MG PO TABS
325.0000 mg | ORAL_TABLET | ORAL | Status: DC | PRN
  Administered 2013-09-22: 325 mg via ORAL
  Filled 2013-09-21: qty 1

## 2013-09-21 MED ORDER — SODIUM CHLORIDE 0.9 % IV SOLN
1.0000 g | INTRAVENOUS | Status: AC
  Administered 2013-09-21: 1 g via INTRAVENOUS
  Filled 2013-09-21: qty 10

## 2013-09-21 MED ORDER — MORPHINE SULFATE 2 MG/ML IJ SOLN
2.0000 mg | Freq: Once | INTRAMUSCULAR | Status: AC
  Administered 2013-09-21: 2 mg via INTRAVENOUS
  Filled 2013-09-21: qty 1

## 2013-09-21 MED ORDER — SODIUM CHLORIDE 0.9 % IJ SOLN
3.0000 mL | Freq: Two times a day (BID) | INTRAMUSCULAR | Status: DC
  Administered 2013-09-22 – 2013-09-25 (×6): 3 mL via INTRAVENOUS

## 2013-09-21 MED ORDER — SODIUM POLYSTYRENE SULFONATE 15 GM/60ML PO SUSP
15.0000 g | Freq: Once | ORAL | Status: AC
  Administered 2013-09-21: 15 g via ORAL
  Filled 2013-09-21: qty 60

## 2013-09-21 MED ORDER — FUROSEMIDE 10 MG/ML IJ SOLN
40.0000 mg | Freq: Once | INTRAMUSCULAR | Status: AC
  Administered 2013-09-22: 40 mg via INTRAVENOUS
  Filled 2013-09-21: qty 4

## 2013-09-21 MED ORDER — VANCOMYCIN HCL IN DEXTROSE 750-5 MG/150ML-% IV SOLN
750.0000 mg | INTRAVENOUS | Status: DC
  Administered 2013-09-22: 750 mg via INTRAVENOUS
  Filled 2013-09-21 (×2): qty 150

## 2013-09-21 MED ORDER — ENSURE COMPLETE PO LIQD
237.0000 mL | Freq: Two times a day (BID) | ORAL | Status: DC
  Administered 2013-09-23 – 2013-09-24 (×2): 237 mL via ORAL

## 2013-09-21 MED ORDER — RIVAROXABAN 15 MG PO TABS
15.0000 mg | ORAL_TABLET | Freq: Every day | ORAL | Status: DC
  Administered 2013-09-21 – 2013-09-24 (×4): 15 mg via ORAL
  Filled 2013-09-21 (×5): qty 1

## 2013-09-21 MED ORDER — PIPERACILLIN-TAZOBACTAM 3.375 G IVPB
3.3750 g | Freq: Three times a day (TID) | INTRAVENOUS | Status: DC
  Administered 2013-09-22 – 2013-09-23 (×4): 3.375 g via INTRAVENOUS
  Filled 2013-09-21 (×6): qty 50

## 2013-09-21 MED ORDER — CALCIUM CARBONATE ANTACID 500 MG PO CHEW
2.0000 | CHEWABLE_TABLET | Freq: Two times a day (BID) | ORAL | Status: DC
  Administered 2013-09-21 – 2013-09-25 (×8): 400 mg via ORAL
  Filled 2013-09-21 (×9): qty 2

## 2013-09-21 NOTE — H&P (Signed)
Date: 09/21/2013               Patient Name:  Kim [name] MRN: [id number]  DOB: April 13, 1929 Age / Sex: 79 y.o., female   PCP: Evelena Peat, DO         Medical Service: Internal Medicine Teaching Service         Attending Physician: Dr. Rocco Serene, MD    First Contact: Dr. Aundria Rud Pager: [phone]  Second Contact: Dr. Shirlee Latch  Pager: [phone]       After Hours (After 5p/  First Contact Pager: [phone]  weekends / holidays): Second Contact Pager: [phone]   Chief Complaint: right arm and hand pain, right leg pain  History of Present Illness:  Ms. Blomgren is an 79 year old woman with history of atrial fibrillation (on Xarelto), dCHF (grade 1 diastolic dysfunction), pulmonary hypertension, rheumatoid arthritis, osteoarthritis, gout who presents with right arm and hand pain as well as right leg pain. Patient is poor historian.  Patient states that she has had rheumatoid arthritis for 20 years and was not bothered by it until relatively recently.  She states that her right thumb has been swollen for months and her right hand is usually swollen as well though has become more so recently.  Her right arm and hand are more painful than usual.  She also states that today her legs started hurting R>L and she could not really stretch her legs out in bed due to pain.  She only takes Tylenol for RA pain.  She came into ED today due to unbearable pain "all over."  Denies fever, chest pain, shortness of breath, abdominal pain, nausea/vomiting, change in bowel or bladder habits, dizziness.   She lives at home with her son and "has a little bit of everything" in terms of equipment- wheelchair, shower chair, shower bars, etc.  She no longer ambulates much.   Meds: Current Facility-Administered Medications  Medication Dose Route Frequency Provider Last Rate Last Dose  . piperacillin-tazobactam (ZOSYN) IVPB 3.375 g  3.375 g Intravenous Once Hurman Horn, MD      . vancomycin (VANCOCIN) IVPB  1000 mg/200 mL premix  1,000 mg Intravenous Once Hurman Horn, MD       Current Outpatient Prescriptions  Medication Sig Dispense Refill  . acetaminophen (TYLENOL) 325 MG tablet Take 325 mg by mouth every 6 (six) hours as needed. pain      . ALPRAZolam (XANAX) 0.25 MG tablet Take 1 tablet (0.25 mg total) by mouth at bedtime as needed for anxiety.  30 tablet  0  . calcium carbonate (TUMS - DOSED IN MG ELEMENTAL CALCIUM) 500 MG chewable tablet Chew 2 tablets (400 mg of elemental calcium total) by mouth 2 (two) times daily.  120 tablet  1  . colchicine 0.6 MG tablet Take 1 tablet (0.6 mg total) by mouth daily.  90 tablet  0  . diltiazem (TIAZAC) 240 MG 24 hr capsule Take 1 capsule (240 mg total) by mouth daily.  30 capsule  1  . feeding supplement (ENSURE COMPLETE) LIQD Take 237 mLs by mouth 2 (two) times daily between meals.  60 Bottle  1  . furosemide (LASIX) 40 MG tablet take 1 tablet by mouth twice a day  60 tablet  0  . isosorbide-hydrALAZINE (BIDIL) 20-37.5 MG per tablet Take 1 tablet by mouth 2 (two) times daily.  60 tablet  1  . latanoprost (XALATAN) 0.005 % ophthalmic solution Place 1 drop into both eyes  at bedtime.      . meclizine (ANTIVERT) 12.5 MG tablet Take 1 tablet (12.5 mg total) by mouth 2 (two) times daily as needed for dizziness.  30 tablet  2  . metoprolol succinate (TOPROL-XL) 50 MG 24 hr tablet Take 1 tablet (50 mg total) by mouth daily.  30 tablet  3  . pantoprazole (PROTONIX) 40 MG tablet Take 1 tablet (40 mg total) by mouth daily at 12 noon.  30 tablet  1  . potassium chloride SA (K-DUR,KLOR-CON) 20 MEQ tablet Take 1 tablet (20 mEq total) by mouth daily.  30 tablet  3  . Rivaroxaban (XARELTO) 15 MG TABS tablet Take 1 tablet (15 mg total) by mouth daily.  30 tablet  11   Allergies: Allergies as of 09/21/2013  . (No Known Allergies)   Past Medical History  Diagnosis Date  . HTN (hypertension)   . AF (atrial fibrillation)     On Xarelto as of 11/2011  . Diastolic heart  failure     Echo 5/11: There was moderate concentric hypertrophy. Systolic function was vigorous. The best esimated ejection fraction was in the range of 65% to 70%. Wall motion was normal  . Gout   . RA (rheumatoid arthritis)   . Chronic renal insufficiency     Baseline approx: 1.7  . Glaucoma     No recent eye visits noted in EMR.   . Pulmonary artery hypertension     Per 5/11 echo, PA peak pressure: 69mm Hg  . Stroke   . Coronary artery disease    Past Surgical History  Procedure Laterality Date  . Cardiac catheterization  4/02     Minimal luminal irregularities without significant focal stenosis.  Marland Kitchen Extracapsular cataract extraction with intraocular lens  11/08    Right eye, Dr. Mitzi Davenport   Family History  Problem Relation Age of Onset  . Diabetes Mother   . Heart disease Father 74    Currently deceased  . Stroke Sister 63  . Stroke Mother 72   History   Social History  . Marital Status: Widowed    Spouse Name: N/A    Number of Children: N/A  . Years of Education: N/A   Occupational History  . 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    Social History Main Topics  . Smoking status: Former Smoker    Types: Cigarettes    Quit date: 10/11/1971  . Smokeless tobacco: Not on file  . Alcohol Use: No  . Drug Use: No  . Sexual Activity: No   Other Topics Concern  . Not on file   Social History Narrative   Financial assistance approved for 25% discount after Medicare pays for [facility] only, not eligible for [facility] card.      Has [place] health care, medicare.  Currently lives with her daughter and her 2 granddaughters. Has 3 children in town which she is close to and is able to rely on if needed.  High school education. She normally lives alone and can ambulate with a walker. She normally walks herself to the bathroom and perform basic ADLs.    Review of Systems: Review of Systems  Constitutional: Positive for malaise/fatigue. Negative for fever and chills.    Eyes: Negative for blurred vision.  Respiratory: Negative for cough and shortness of breath.   Cardiovascular: Negative for chest pain, palpitations and leg swelling.  Gastrointestinal: Negative for nausea, vomiting, abdominal pain, diarrhea, constipation and blood  in stool.  Genitourinary: Negative for dysuria.  Musculoskeletal: Positive for joint pain. Negative for back pain, falls and neck pain.  Neurological: Negative for dizziness, focal weakness, loss of consciousness, weakness and headaches.   Physical Exam: Blood pressure 102/45, pulse 59, temperature 97.9 F (36.6 C), temperature source Oral, resp. rate 31, SpO2 99.00%. General: alert, cooperative, and in no apparent distress HEENT: pupils equal round and reactive to light, vision grossly intact, oropharynx clear and non-erythematous  Neck: supple, no lymphadenopathy Lungs: clear to ascultation bilaterally, normal work of respiration, no wheezes, rales, ronchi Heart: tachycardic, regular rate and rhythm, no murmurs, gallops, or rubs Abdomen: soft, non-tender, non-distended, normal bowel sounds Extremities: right arm and hand extremely swollen but no erythema or drainage, right knee TTP with ?edema but no erythema,  2+ DP/PT pulses bilaterally, no cyanosis, clubbing, or edema Neurologic: alert & oriented X3, cranial nerves II-XII intact, strength grossly intact, sensation intact to light touch   Lab results: Basic Metabolic Panel:  Recent Labs  40/98/11 1150  NA 135  K 5.5*  CL 101  CO2 16*  GLUCOSE 117*  BUN 21  CREATININE 0.97  CALCIUM 8.6   CBC:  Recent Labs  09/21/13 1150  WBC 9.9  NEUTROABS 7.8*  HGB 8.5*  HCT 27.4*  MCV 89.3  PLT 425*   BNP:  Recent Labs  09/21/13 1653  PROBNP 1389.0*   Urine Drug Screen: Drugs of Abuse     Component Value Date/Time   LABOPIA NONE DETECTED 01/26/2010 1130   COCAINSCRNUR NONE DETECTED 01/26/2010 1130   LABBENZ POSITIVE* 01/26/2010 1130   AMPHETMU NONE DETECTED  01/26/2010 1130   THCU NONE DETECTED 01/26/2010 1130   LABBARB  Value: NONE DETECTED        DRUG SCREEN FOR MEDICAL PURPOSES ONLY.  IF CONFIRMATION IS NEEDED FOR ANY PURPOSE, NOTIFY LAB WITHIN 5 DAYS.        LOWEST DETECTABLE LIMITS FOR URINE DRUG SCREEN Drug Class       Cutoff (ng/mL) Amphetamine      1000 Barbiturate      200 Benzodiazepine   200 Tricyclics       300 Opiates          300 Cocaine          300 THC              50 01/26/2010 1130     Imaging results:  Dg Chest Port 1 View  09/21/2013   CLINICAL DATA:  Chest pain  EXAM: PORTABLE CHEST - 1 VIEW  COMPARISON:  July 01, 2013  FINDINGS: There is severe pulmonary edema. There is a moderate right pleural effusion. Consolidation of the right lung base is identified. Multiple overlying artifacts are noted. Mediastinal contour and cardiac silhouette are stable.  IMPRESSION: Severe pulmonary edema.  Moderate right pleural effusion.   Electronically Signed   By: Sherian Rein M.D.   On: 09/21/2013 17:45   Dg Knee Right Port  09/21/2013   CLINICAL DATA:  Pain  EXAM: PORTABLE RIGHT KNEE - 1-2 VIEW  COMPARISON:  11/13/2006  FINDINGS: There is severe loss of joint space and there is cystic subchondral degenerative change. There is a joint effusion. There are well circumscribed erosive changes seen in the anterior condyles as well as along the medial femoral condyle.  IMPRESSION: Severe degenerative change, with findings similar to prior study although advanced in severity. As previously indicated, these findings would be consistent with the clinical diagnosis of rheumatoid arthritis.  Electronically Signed   By: Esperanza Heir M.D.   On: 09/21/2013 17:50   Other results: EKG: sinus tachycardia (rate 124), normal axis, normal intervals, no LVH, no ST or T wave changes  Assessment & Plan by Problem: #HCAP- portable CXR showed severe pulmonary edema with moderate right pleural effusion and consolidation in right lung base.  CURB65 score of 2 (6.8%  30 day mortality).  Abfebrile, WBC 9.9, but does not currently meet SIRS criteria. Lactic acid 8.91. Satting 100% on RA though patient states she wears oxygen at home.  -admit to IMTS step-down, telemetry  -UA, blood cultures x 2 pending -continue vancomycin, zosyn  -hold off on IVFs for now given pulmonary edema -BMP at 8p -CBC, BMP, Mg, Phos in AM -consider palliative care consult   #Anion gap metabolic acidosis- AG 18, likely due to lactic acidosis given lactic acid 8.9 -ABG stat, pending -trend lactic acid -blood cultures x 2     #dCHF- Recently admitted in 05/2013 for acute on chronic dCHF.  Echo at that time showed EF 60-65%, normal systolic function, wall motion normal, grade 1 diastolic dysfunction, severe pulmonary hypertension. pBNP 1389 (3679 in 05/2013), no evidence of volume overload on exam (no crackles, no LE edema).  However, portable CXR shows severe pulmonary edema with moderate right pleural effusion.  -cardiology consult (outpatient cardiologist) -repeat CXR PA and lateral  -avoid IVFs  #Bradycardia- asymptomatic but bradycardic to 30s on exam, also intermittently tachycardic.  Became bradycardic to 20s, symptomatic in ED after interview, gave atropine and calcium gluconate then HR went back into 50s. Troponin x 1 in ED negative.  -cardiology following -holding beta blocker, Ca channel blocker -external pacers in place -atropine IV 0.5 mg prn -trend troponins  -repeat EKG in AM   #RA flare- Patient having increased pain in right arm and hand, right leg from baseline.  She was diagnosed with RA >20 years ago, patient takes Tylenol at home for pain.  ESR elevated at 82 on presentation.  Right arm and hand swollen, but patient has history of CVA and lies on right side 24/7.  Patient also has OA and gout but less likely given above.  Toradol IV 30 mg in ED which significantly reduced pain but will switch to Norco for pain control given history of CKD.  Right knee xray showed  severe degenerative change with findings similar to prior study though advanced in severity, consistent with RA.  -Norco 5-325 q8h prn pain  -RUE Doppler despite fact that patient on Xarelto for a fib anticoagulation, reports good compliance  -acetaminophen, salicylate levels pending  #Hyperkalemia- 5.5, patient received kayexelate 15 g in ED -repeat BMP at 8p  #History of atrial fibrillation- sinus rhythm now, on Xarelto for anticoagulation.  -continue Xarelto  #DVT PPX- Xarelto per above  #Code status- DNR/DNI   Dispo: Disposition is deferred at this time, awaiting improvement of current medical problems. Anticipated discharge in approximately 3-4 day(s).   The patient does have a current PCP Evelena Peat, DO) and does need an [facility] hospital follow-up appointment after discharge.   Signed: Rocco Serene, MD 09/21/2013, 6:27 PM

## 2013-09-21 NOTE — Consult Note (Signed)
Kim [name] is an 79 y.o. female.   Chief Complaint: Symptomatic bradycardia HPI: 79 years old female with past history of hypertension, atrial fibrillation, gout, rheumatoid arthritis, chronic renal insufficiency, coronary artery disease and pulmonary artery hypertension has high grade AV block with heart rate in 20's and 30's. Treated by ER doctor with atropine and NaHCO3, fluids and calcium chloride 10 meq. IV. Patient came to ED for pain control. She also has chronic right arm significant edema x 1 month. She is on B-blocker and diltiazem for for chronic atrial fibrillation. No chest pain or shortness of breath on arrival.   Past Medical History  Diagnosis Date  . HTN (hypertension)   . AF (atrial fibrillation)     On Xarelto as of 11/2011  . Diastolic heart failure     Echo 5/11: There was moderate concentric hypertrophy. Systolic function was vigorous. The best esimated ejection fraction was in the range of 65% to 70%. Wall motion was normal  . Gout   . RA (rheumatoid arthritis)   . Chronic renal insufficiency     Baseline approx: 1.7  . Glaucoma     No recent eye visits noted in EMR.   . Pulmonary artery hypertension     Per 5/11 echo, PA peak pressure: 69mm Hg  . Stroke   . Coronary artery disease       Past Surgical History  Procedure Laterality Date  . Cardiac catheterization  4/02     Minimal luminal irregularities without significant focal stenosis.  Marland Kitchen Extracapsular cataract extraction with intraocular lens  11/08    Right eye, Dr. Mitzi Davenport    Family History  Problem Relation Age of Onset  . Diabetes Mother   . Heart disease Father 8    Currently deceased  . Stroke Sister 24  . Stroke Mother 54   Social History:  reports that she quit smoking about 41 years ago. Her smoking use included Cigarettes. She smoked 0.00 packs per day. She does not have any smokeless tobacco history on file. She reports that she does not drink alcohol or use illicit  drugs.  Allergies: No Known Allergies   (Not in a hospital admission)  Results for orders placed during the hospital encounter of 09/21/13 (from the past 48 hour(s))  BASIC METABOLIC PANEL     Status: Abnormal   Collection Time    09/21/13 11:50 AM      Result Value Range   Sodium 135  135 - 145 mEq/L   Potassium 5.5 (*) 3.5 - 5.1 mEq/L   Chloride 101  96 - 112 mEq/L   CO2 16 (*) 19 - 32 mEq/L   Glucose, Bld 117 (*) 70 - 99 mg/dL   BUN 21  6 - 23 mg/dL   Creatinine, Ser 1.61  0.50 - 1.10 mg/dL   Calcium 8.6  8.4 - 09.6 mg/dL   GFR calc non Af Amer 53 (*) >90 mL/min   GFR calc Af Amer 61 (*) >90 mL/min   Comment: (NOTE)     The eGFR has been calculated using the CKD EPI equation.     This calculation has not been validated in all clinical situations.     eGFR's persistently <90 mL/min signify possible Chronic Kidney     Disease.  CBC WITH DIFFERENTIAL     Status: Abnormal   Collection Time    09/21/13 11:50 AM      Result Value Range   WBC 9.9  4.0 - 10.5 K/uL  Comment: WHITE COUNT CONFIRMED ON SMEAR   RBC 3.07 (*) 3.87 - 5.11 MIL/uL   Hemoglobin 8.5 (*) 12.0 - 15.0 g/dL   HCT 16.1 (*) 09.6 - 04.5 %   MCV 89.3  78.0 - 100.0 fL   MCH 27.7  26.0 - 34.0 pg   MCHC 31.0  30.0 - 36.0 g/dL   RDW 40.9 (*) 81.1 - 91.4 %   Platelets 425 (*) 150 - 400 K/uL   Comment: PLATELET COUNT CONFIRMED BY SMEAR   Neutrophils Relative % 79 (*) 43 - 77 %   Lymphocytes Relative 10 (*) 12 - 46 %   Monocytes Relative 10  3 - 12 %   Eosinophils Relative 1  0 - 5 %   Basophils Relative 0  0 - 1 %   Neutro Abs 7.8 (*) 1.7 - 7.7 K/uL   Lymphs Abs 1.0  0.7 - 4.0 K/uL   Monocytes Absolute 1.0  0.1 - 1.0 K/uL   Eosinophils Absolute 0.1  0.0 - 0.7 K/uL   Basophils Absolute 0.0  0.0 - 0.1 K/uL  SEDIMENTATION RATE     Status: Abnormal   Collection Time    09/21/13 11:50 AM      Result Value Range   Sed Rate 82 (*) 0 - 22 mm/hr  POCT I-STAT TROPONIN I     Status: None   Collection Time     09/21/13  5:03 PM      Result Value Range   Troponin i, poc 0.02  0.00 - 0.08 ng/mL   Comment 3            Comment: Due to the release kinetics of cTnI,     a negative result within the first hours     of the onset of symptoms does not rule out     myocardial infarction with certainty.     If myocardial infarction is still suspected,     repeat the test at appropriate intervals.  CG4 I-STAT (LACTIC ACID)     Status: Abnormal   Collection Time    09/21/13  5:05 PM      Result Value Range   Lactic Acid, Venous 8.91 (*) 0.5 - 2.2 mmol/L   No results found.  ROS Constitutional: Denies fever, chills. +ve fatigue  HEENT: Denies photophobia,. + hemoptysis.  Respiratory: baseline SOB, chronic pulmonary fibrosis and pulmonary hypertension.  Cardiovascular: Denies chest pain, arthritic hand and feet swelling. Endorses occasional palpitations , but not increased in frequency.  Gastrointestinal: endorses nausea, denies current vomiting, abdominal pain, diarrhea, constipation, blood in stool and abdominal distention.  Genitourinary: Denies dysuria, urgency, frequency. Endorses hematuria.  Musculoskeletal: endorses diffuse arthralgias and back pain. States has chronic joint swelling in her hands, knees, and ankles secondary to gout and rheumatoid arthritis.  Skin: Denies pallor, rash and wound.  Neurological: Denies dizziness, syncope, light-headedness, numbness and headaches. endorses weakness.   Blood pressure 96/46, pulse 58, temperature 97.9 F (36.6 C), temperature source Oral, resp. rate 24, SpO2 100.00%. HEENT: [place]/AT, Eyes-Brown, PERL, EOMI, Conjunctiva-Pale, Sclera-Non-icteric  Neck: No JVD, No bruit, Trachea midline.  Lungs: Rare basal crackles, Bilateral.  Cardiac: Irregular and slow rhythm, normal S1 and S2, no S3. II/VI systolic murmur.  Abdomen: Soft, non-tender.  Extremities: No cyanosis. No clubbing. Gouty swelling of both hands and right forearm with warmth and significant  non-pitting edema and deformity.  CNS: AxOx3, Cranial nerves grossly intact, moves all 4 extremities. Right handed.  Skin: Warm and dry.  Assessment/Plan Atrial fibrillation with high grade AV block Chronic Left heart systolic and diastolic heart failure Hyperkalemia Chronic anemia, blood loss and chronic disease Chronic pulmonary fibrosis Chronic pulmonary hypertension Rheumatoid arthritis and gout Right upper extremity swelling r/o sepsis and venous thrombosis  Agree with step down admission and external pacemaker support. Agree with holding B-blocker and calcium channel blocker for now. Chest X-Ray pending.  Kailon Treese S 09/21/2013, 5:26 PM

## 2013-09-21 NOTE — ED Notes (Signed)
Patient checked on to be taken to x-ray.  Patient's heart rate dropped to mid 30's.  Dr. Fonnie Jarvis in to evaluate patient.

## 2013-09-21 NOTE — Progress Notes (Signed)
VASCULAR LAB PRELIMINARY  PRELIMINARY  PRELIMINARY  PRELIMINARY  Right upper extremity venous Doppler completed.    Preliminary report:  There is no obvious evidence of DVT or SVT noted in the right upper extremity.  Kamir Selover, RVT 09/21/2013, 6:40 PM

## 2013-09-21 NOTE — Progress Notes (Signed)
ABG attempted unsuccessfully x2. RN notified.

## 2013-09-21 NOTE — ED Provider Notes (Signed)
CSN: [id number]     Arrival date & time 09/21/13  1001 History   First MD Initiated Contact with Patient 09/21/13 1002     Chief Complaint  Patient presents with  . Leg Pain   (Consider location/radiation/quality/duration/timing/severity/associated sxs/prior Treatment) HPI... Aching right knee pain since this morning described as moderate.  Occipital HA last night.   No fever, chills, chest pain, dyspnea, dysuria, trauma, neuro deficits.   Positioning and movement makes the pain worse.  Also complains of puffy hands for approximately one month. Status post CVA in September 2014  Past Medical History  Diagnosis Date  . HTN (hypertension)   . AF (atrial fibrillation)     On Xarelto as of 11/2011  . Diastolic heart failure     Echo 5/11: There was moderate concentric hypertrophy. Systolic function was vigorous. The best esimated ejection fraction was in the range of 65% to 70%. Wall motion was normal  . Gout   . RA (rheumatoid arthritis)   . Chronic renal insufficiency     Baseline approx: 1.7  . Glaucoma     No recent eye visits noted in EMR.   . Pulmonary artery hypertension     Per 5/11 echo, PA peak pressure: 69mm Hg  . Stroke   . Coronary artery disease    Past Surgical History  Procedure Laterality Date  . Cardiac catheterization  4/02     Minimal luminal irregularities without significant focal stenosis.  Marland Kitchen Extracapsular cataract extraction with intraocular lens  11/08    Right eye, Dr. Mitzi Davenport   Family History  Problem Relation Age of Onset  . Diabetes Mother   . Heart disease Father 62    Currently deceased  . Stroke Sister 69  . Stroke Mother 20   History  Substance Use Topics  . Smoking status: Former Smoker    Types: Cigarettes    Quit date: 10/11/1971  . Smokeless tobacco: Not on file  . Alcohol Use: No   OB History   Grav Para Term Preterm Abortions TAB SAB Ect Mult Living                 Review of Systems  All other systems reviewed and are  negative.    Allergies  Review of patient's allergies indicates no known allergies.  Home Medications   Current Outpatient Rx  Name  Route  Sig  Dispense  Refill  . acetaminophen (TYLENOL) 325 MG tablet   Oral   Take 325 mg by mouth every 6 (six) hours as needed. pain         . ALPRAZolam (XANAX) 0.25 MG tablet   Oral   Take 1 tablet (0.25 mg total) by mouth at bedtime as needed for anxiety.   30 tablet   0   . calcium carbonate (TUMS - DOSED IN MG ELEMENTAL CALCIUM) 500 MG chewable tablet   Oral   Chew 2 tablets (400 mg of elemental calcium total) by mouth 2 (two) times daily.   120 tablet   1   . colchicine 0.6 MG tablet   Oral   Take 1 tablet (0.6 mg total) by mouth daily.   90 tablet   0   . diltiazem (TIAZAC) 240 MG 24 hr capsule   Oral   Take 1 capsule (240 mg total) by mouth daily.   30 capsule   1   . feeding supplement (ENSURE COMPLETE) LIQD   Oral   Take 237 mLs by mouth 2 (two)  times daily between meals.   60 Bottle   1   . furosemide (LASIX) 40 MG tablet      take 1 tablet by mouth twice a day   60 tablet   0   . isosorbide-hydrALAZINE (BIDIL) 20-37.5 MG per tablet   Oral   Take 1 tablet by mouth 2 (two) times daily.   60 tablet   1   . latanoprost (XALATAN) 0.005 % ophthalmic solution   Both Eyes   Place 1 drop into both eyes at bedtime.         . meclizine (ANTIVERT) 12.5 MG tablet   Oral   Take 1 tablet (12.5 mg total) by mouth 2 (two) times daily as needed for dizziness.   30 tablet   2   . metoprolol succinate (TOPROL-XL) 50 MG 24 hr tablet   Oral   Take 1 tablet (50 mg total) by mouth daily.   30 tablet   3   . pantoprazole (PROTONIX) 40 MG tablet   Oral   Take 1 tablet (40 mg total) by mouth daily at 12 noon.   30 tablet   1   . potassium chloride SA (K-DUR,KLOR-CON) 20 MEQ tablet   Oral   Take 1 tablet (20 mEq total) by mouth daily.   30 tablet   3   . Rivaroxaban (XARELTO) 15 MG TABS tablet   Oral   Take 1  tablet (15 mg total) by mouth daily.   30 tablet   11    BP 123/70  Pulse 126  Temp(Src) 97.9 F (36.6 C) (Oral)  Resp 26  SpO2 100% Physical Exam  Nursing note and vitals reviewed. Constitutional: She is oriented to person, place, and time. She appears well-developed and well-nourished.  HENT:  Head: Normocephalic and atraumatic.  Eyes: Conjunctivae and EOM are normal. Pupils are equal, round, and reactive to light.  Neck: Normal range of motion. Neck supple.  Cardiovascular: Normal rate, regular rhythm and normal heart sounds.   Pulmonary/Chest: Effort normal and breath sounds normal.  Abdominal: Soft. Bowel sounds are normal.  nontender  Genitourinary:  Normal genitalia  Musculoskeletal: Normal range of motion.  Neurological: She is alert and oriented to person, place, and time.  Skin: Skin is warm and dry.  Bilateral hands puffy.    Right knee:   Tender to palpation anteriorly.   Pain with flexion and extension  Psychiatric: She has a normal mood and affect. Her behavior is normal.    ED Course  Procedures (including critical care time) Labs Review Labs Reviewed  BASIC METABOLIC PANEL - Abnormal; Notable for the following:    Potassium 5.5 (*)    CO2 16 (*)    Glucose, Bld 117 (*)    GFR calc non Af Amer 53 (*)    GFR calc Af Amer 61 (*)    All other components within normal limits  CBC WITH DIFFERENTIAL - Abnormal; Notable for the following:    RBC 3.07 (*)    Hemoglobin 8.5 (*)    HCT 27.4 (*)    RDW 17.8 (*)    Platelets 425 (*)    Neutrophils Relative % 79 (*)    Lymphocytes Relative 10 (*)    Neutro Abs 7.8 (*)    All other components within normal limits  SEDIMENTATION RATE - Abnormal; Notable for the following:    Sed Rate 82 (*)    All other components within normal limits   Imaging Review No results  found.  EKG Interpretation    Date/Time:  Saturday September 21 2013 10:11:41 EST Ventricular Rate:  125 PR Interval:  131 QRS Duration: 66 QT  Interval:  310 QTC Calculation: 447 R Axis:   52 Text Interpretation:  Sinus or ectopic atrial tachycardia Low voltage, extremity leads Nonspecific repol abnormality, lateral leads Confirmed by Aleyah Balik  MD, Kayna Suppa ([id number]) on 09/21/2013 2:17:39 PM           Date: 09/21/2013  Rate: 125  Rhythm: sinus tachy  QRS Axis: normal  Intervals: normal  ST/T Wave abnormalities: normal  Conduction Disutrbances: none  Narrative Interpretation: unremarkable     MDM  No diagnosis found. Uncertain etiology of patient's right knee pain. She has gout and rheumatoid arthritis.  Also her blood pressure systolic has been <100.  Discussed with internal medicine teaching service.    Donnetta Hutching, MD 09/21/13 1540

## 2013-09-21 NOTE — Progress Notes (Signed)
ANTIBIOTIC CONSULT NOTE - INITIAL  Pharmacy Consult for vancomycin and Zosyn Indication: rule out pneumonia and rule out sepsis  No Known Allergies  Patient Measurements:   Adjusted Body Weight: 50kg  Vital Signs: Temp: 97.4 F (36.3 C) (11/22 2049) Temp src: Oral (11/22 2049) BP: 124/67 mmHg (11/22 1845) Pulse Rate: 95 (11/22 1845) Intake/Output from previous day:   Intake/Output from this shift:    Labs:  Recent Labs  09/21/13 1150 09/21/13 1653  WBC 9.9  --   HGB 8.5*  --   PLT 425*  --   CREATININE 0.97 1.18*   The CrCl is unknown because both a height and weight (above a minimum accepted value) are required for this calculation. No results found for this basename: VANCOTROUGH, VANCOPEAK, VANCORANDOM, GENTTROUGH, GENTPEAK, GENTRANDOM, TOBRATROUGH, TOBRAPEAK, TOBRARND, AMIKACINPEAK, AMIKACINTROU, AMIKACIN,  in the last 72 hours   Microbiology: No results found for this or any previous visit (from the past 720 hour(s)).  Medical History: Past Medical History  Diagnosis Date  . HTN (hypertension)   . AF (atrial fibrillation)     On Xarelto as of 11/2011  . Diastolic heart failure     Echo 5/11: There was moderate concentric hypertrophy. Systolic function was vigorous. The best esimated ejection fraction was in the range of 65% to 70%. Wall motion was normal  . Gout   . RA (rheumatoid arthritis)   . Chronic renal insufficiency     Baseline approx: 1.7  . Glaucoma     No recent eye visits noted in EMR.   . Pulmonary artery hypertension     Per 5/11 echo, PA peak pressure: 69mm Hg  . Stroke   . Coronary artery disease     Medications:  Prescriptions prior to admission  Medication Sig Dispense Refill  . acetaminophen (TYLENOL) 325 MG tablet Take 325 mg by mouth every 6 (six) hours as needed. pain      . ALPRAZolam (XANAX) 0.25 MG tablet Take 1 tablet (0.25 mg total) by mouth at bedtime as needed for anxiety.  30 tablet  0  . calcium carbonate (TUMS - DOSED  IN MG ELEMENTAL CALCIUM) 500 MG chewable tablet Chew 2 tablets (400 mg of elemental calcium total) by mouth 2 (two) times daily.  120 tablet  1  . colchicine 0.6 MG tablet Take 1 tablet (0.6 mg total) by mouth daily.  90 tablet  0  . diltiazem (TIAZAC) 240 MG 24 hr capsule Take 1 capsule (240 mg total) by mouth daily.  30 capsule  1  . feeding supplement (ENSURE COMPLETE) LIQD Take 237 mLs by mouth 2 (two) times daily between meals.  60 Bottle  1  . furosemide (LASIX) 40 MG tablet take 1 tablet by mouth twice a day  60 tablet  0  . isosorbide-hydrALAZINE (BIDIL) 20-37.5 MG per tablet Take 1 tablet by mouth 2 (two) times daily.  60 tablet  1  . latanoprost (XALATAN) 0.005 % ophthalmic solution Place 1 drop into both eyes at bedtime.      . meclizine (ANTIVERT) 12.5 MG tablet Take 1 tablet (12.5 mg total) by mouth 2 (two) times daily as needed for dizziness.  30 tablet  2  . metoprolol succinate (TOPROL-XL) 50 MG 24 hr tablet Take 1 tablet (50 mg total) by mouth daily.  30 tablet  3  . pantoprazole (PROTONIX) 40 MG tablet Take 1 tablet (40 mg total) by mouth daily at 12 noon.  30 tablet  1  . potassium  chloride SA (K-DUR,KLOR-CON) 20 MEQ tablet Take 1 tablet (20 mEq total) by mouth daily.  30 tablet  3  . Rivaroxaban (XARELTO) 15 MG TABS tablet Take 1 tablet (15 mg total) by mouth daily.  30 tablet  11   Assessment: 79 year old woman to be started on vancomycin and Zosyn for possible HCAP.  Goal of Therapy:  Vancomycin trough level 15-20 mcg/ml  Plan:  Expected duration 7 days with resolution of temperature and/or normalization of WBC Measure antibiotic drug levels at steady state Follow up culture results vancomycin 750mg  IV q24 Zosyn 3.375g IV q8h (infuse over 4 hours)   Mickeal Skinner 09/21/2013,8:56 PM

## 2013-09-21 NOTE — ED Notes (Signed)
Patient brought in via [facility] patient  Complains of aching all over since last night around 8 pm. Pain advised that she had a headache around 8 pm in the back of the head, pain unbearable. Patient advised that she took two Tylenol and went to bed slept all night and this morning she woke with pain all over. History of stroke in Sept. 2014. Patient has gross edema noted in bilateral upper extremities and hands. Alert and oriented follows commands.

## 2013-09-21 NOTE — ED Provider Notes (Signed)
Called to see patient for syncopal spell, patient with symptomatic bradycardia with heart rate in the 20s with systolic blood pressure in the low 80s, patient woke up and was oriented to person place and time after receiving one dose of atropine, she apparently has been treated with Kayexalate for mild hyperkalemia and due to symptomatic bradycardia with low bicarbonate level as well patient was also treated now with calcium gluconate, bicarbonate, glucose and insulin in case she had symptomatic hyperkalemia even though it would be unlikely for a level of 5.5 to cause her spell of symptomatic bradycardia, she also had transcutaneous pacemaker tested successfully in place in case she had recurrent spells of symptomatic bradycardia, I discussed the case with internal medicine admitting service as well as the patient's cardiologist Dr. Wylie Hail who saw the patient in the emergency department, the patient states she had some shortness of breath all day however lungs are clear room air pulse oximetry is normal at 100%, patient given IV fluid bolus because despite improved heart rate to the 50s repeat systolic blood pressure in the high 80s; patient will be admitted to the step down unit; [place] ordered right arm to rule out DVT since patient has had swollen right arm for a month. POC lactate elevated so in case right arm swelling from cellulitis antibiotics to be ordered as well.  CRITICAL CARE Performed by: Hurman Horn Total critical care time: Critical care time was exclusive of separately billable procedures and treating other patients. Critical care was necessary to treat or prevent imminent or life-threatening deterioration. Critical care was time spent personally by me on the following activities: development of treatment plan with patient and/or surrogate as well as nursing, discussions with consultants, evaluation of patient's response to treatment, examination of patient, obtaining history from  patient or surrogate, ordering and performing treatments and interventions, ordering and review of laboratory studies, ordering and review of radiographic studies, pulse oximetry and re-evaluation of patient's condition.  1633 ECG: Atrial fibrillation, ventricular rate 35, bradycardia, normal axis, prolonged QTc interval, compared with previous tracing symptomatic bradycardia now present  Hurman Horn, MD 09/22/13 1311

## 2013-09-22 ENCOUNTER — Inpatient Hospital Stay (HOSPITAL_COMMUNITY): Payer: Medicare Other

## 2013-09-22 DIAGNOSIS — J90 Pleural effusion, not elsewhere classified: Secondary | ICD-10-CM

## 2013-09-22 DIAGNOSIS — I2789 Other specified pulmonary heart diseases: Secondary | ICD-10-CM

## 2013-09-22 DIAGNOSIS — J811 Chronic pulmonary edema: Secondary | ICD-10-CM

## 2013-09-22 DIAGNOSIS — M25569 Pain in unspecified knee: Secondary | ICD-10-CM

## 2013-09-22 LAB — CBC WITH DIFFERENTIAL/PLATELET
Basophils Absolute: 0 10*3/uL (ref 0.0–0.1)
Basophils Relative: 0 % (ref 0–1)
Eosinophils Relative: 0 % (ref 0–5)
Hemoglobin: 7 g/dL — ABNORMAL LOW (ref 12.0–15.0)
MCH: 27.9 pg (ref 26.0–34.0)
MCHC: 32.3 g/dL (ref 30.0–36.0)
Neutro Abs: 7.8 10*3/uL — ABNORMAL HIGH (ref 1.7–7.7)
Neutrophils Relative %: 85 % — ABNORMAL HIGH (ref 43–77)
Platelets: 370 10*3/uL (ref 150–400)
RDW: 18.1 % — ABNORMAL HIGH (ref 11.5–15.5)

## 2013-09-22 LAB — COMPREHENSIVE METABOLIC PANEL
ALT: 9 U/L (ref 0–35)
Alkaline Phosphatase: 153 U/L — ABNORMAL HIGH (ref 39–117)
CO2: 24 mEq/L (ref 19–32)
Calcium: 8.1 mg/dL — ABNORMAL LOW (ref 8.4–10.5)
Chloride: 101 mEq/L (ref 96–112)
GFR calc Af Amer: 44 mL/min — ABNORMAL LOW (ref >90)
GFR calc non Af Amer: 38 mL/min — ABNORMAL LOW (ref >90)
Glucose, Bld: 130 mg/dL — ABNORMAL HIGH (ref 70–99)
Sodium: 138 mEq/L (ref 135–145)
Total Bilirubin: 0.5 mg/dL (ref 0.3–1.2)
Total Protein: 6.1 g/dL (ref 6.0–8.3)

## 2013-09-22 LAB — LACTIC ACID, PLASMA: Lactic Acid, Venous: 3.8 mmol/L — ABNORMAL HIGH (ref 0.5–2.2)

## 2013-09-22 LAB — CBC
Hemoglobin: 6.9 g/dL — CL (ref 12.0–15.0)
MCH: 27.2 pg (ref 26.0–34.0)
Platelets: 379 10*3/uL (ref 150–400)
RBC: 2.54 MIL/uL — ABNORMAL LOW (ref 3.87–5.11)
RDW: 18.3 % — ABNORMAL HIGH (ref 11.5–15.5)
WBC: 9 10*3/uL (ref 4.0–10.5)

## 2013-09-22 LAB — PHOSPHORUS: Phosphorus: 4.2 mg/dL (ref 2.3–4.6)

## 2013-09-22 LAB — MAGNESIUM: Magnesium: 1.9 mg/dL (ref 1.5–2.5)

## 2013-09-22 MED ORDER — DILTIAZEM HCL 30 MG PO TABS
30.0000 mg | ORAL_TABLET | Freq: Four times a day (QID) | ORAL | Status: DC
  Administered 2013-09-22 – 2013-09-23 (×4): 30 mg via ORAL
  Filled 2013-09-22 (×8): qty 1

## 2013-09-22 MED ORDER — PREDNISONE 20 MG PO TABS
20.0000 mg | ORAL_TABLET | Freq: Every day | ORAL | Status: AC
  Administered 2013-09-22: 20 mg via ORAL
  Filled 2013-09-22: qty 1

## 2013-09-22 MED ORDER — PREDNISONE 20 MG PO TABS
20.0000 mg | ORAL_TABLET | Freq: Every day | ORAL | Status: DC
  Filled 2013-09-22: qty 1

## 2013-09-22 NOTE — Progress Notes (Signed)
Subjective: Kim [name] is doing about the same this morning.  States that she does not want to take Norco for pain, requesting steroids instead.   Sinus tachycardia with rate in 120s on monitor.   Objective: Vital signs in last 24 hours: Filed Vitals:   09/22/13 0600 09/22/13 0700 09/22/13 0728 09/22/13 1100  BP: 127/71 127/71    Pulse:      Temp:   97.6 F (36.4 C) 98.3 F (36.8 C)  TempSrc:   Oral   Resp:      Height:      Weight:      SpO2:   100%    Weight change:   Intake/Output Summary (Last 24 hours) at 09/22/13 1319 Last data filed at 09/22/13 1000  Gross per 24 hour  Intake   1112 ml  Output    300 ml  Net    812 ml   PEX General: alert, cooperative, and in no apparent distress HEENT: NCAT Neck: supple, no lymphadenopathy, JVD on left to 6-7 cm Lungs: clear to ascultation except for rales in right lung base, normal work of respiration, no wheezes, ronchi Heart: tachycardic, regular rate and rhythm, no murmurs, gallops, or rubs Abdomen: soft, non-tender, non-distended, normal bowel sounds Extremities: right arm and hand extremely swollen but no erythema or drainage, right knee TTP but no erythema; 2+ DP/PT pulses bilaterally, no cyanosis, clubbing, or edema Neurologic: alert & oriented X3, cranial nerves II-XII intact, strength grossly intact, sensation intact to light touch  Lab Results: Basic Metabolic Panel:  Recent Labs Lab 09/21/13 1653 09/21/13 2200  NA 134* 135  K 6.5* 5.8*  CL 98 102  CO2 11* 22  GLUCOSE 247* 220*  BUN 24* 26*  CREATININE 1.18* 1.15*  CALCIUM 8.4 8.5  MG  --  1.8   CBC:  Recent Labs Lab 09/21/13 1150  WBC 9.9  NEUTROABS 7.8*  HGB 8.5*  HCT 27.4*  MCV 89.3  PLT 425*   Cardiac Enzymes:  Recent Labs Lab 09/21/13 2200  TROPONINI <0.30   BNP:  Recent Labs Lab 09/21/13 1653  PROBNP 1389.0*   Urine Drug Screen: Drugs of Abuse     Component Value Date/Time   LABOPIA NONE DETECTED 01/26/2010 1130   COCAINSCRNUR NONE DETECTED 01/26/2010 1130   LABBENZ POSITIVE* 01/26/2010 1130   AMPHETMU NONE DETECTED 01/26/2010 1130   THCU NONE DETECTED 01/26/2010 1130   LABBARB  Value: NONE DETECTED        DRUG SCREEN FOR MEDICAL PURPOSES ONLY.  IF CONFIRMATION IS NEEDED FOR ANY PURPOSE, NOTIFY LAB WITHIN 5 DAYS.        LOWEST DETECTABLE LIMITS FOR URINE DRUG SCREEN Drug Class       Cutoff (ng/mL) Amphetamine      1000 Barbiturate      200 Benzodiazepine   200 Tricyclics       300 Opiates          300 Cocaine          300 THC              50 01/26/2010 1130    Urinalysis:  Recent Labs Lab 09/21/13 2055  COLORURINE AMBER*  LABSPEC 1.030  PHURINE 5.0  GLUCOSEU NEGATIVE  HGBUR MODERATE*  BILIRUBINUR SMALL*  KETONESUR NEGATIVE  PROTEINUR 30*  UROBILINOGEN 1.0  NITRITE NEGATIVE  LEUKOCYTESUR MODERATE*    Micro Results: Recent Results (from the past 240 hour(s))  MRSA PCR SCREENING     Status: None  Collection Time    09/21/13  8:33 PM      Result Value Range Status   MRSA by PCR NEGATIVE  NEGATIVE Final   Comment:            The GeneXpert MRSA Assay (FDA     approved for NASAL specimens     only), is one component of a     comprehensive MRSA colonization     surveillance program. It is not     intended to diagnose MRSA     infection nor to guide or     monitor treatment for     MRSA infections.   Studies/Results: Dg Chest Port 1 View  09/22/2013   CLINICAL DATA:  Pulmonary edema.  EXAM: PORTABLE CHEST - 1 VIEW  COMPARISON:  09/21/2013  FINDINGS: Mild cardiomegaly. Improving aeration in the lungs. Moderate residual right lung airspace disease and right effusion. The right effusion is also decreased since prior study. Mild left base atelectasis. No acute bony abnormality.  IMPRESSION: Improving aeration of the lungs with decreasing bilateral airspace disease. Moderate residual right lung airspace disease and right effusion. The right effusion has also decreased.  Stable elevation of the right  hemidiaphragm.  Mild cardiomegaly.   Electronically Signed   By: Charlett Nose M.D.   On: 09/22/2013 11:30   Dg Chest Port 1 View  09/21/2013   CLINICAL DATA:  Chest pain  EXAM: PORTABLE CHEST - 1 VIEW  COMPARISON:  July 01, 2013  FINDINGS: There is severe pulmonary edema. There is a moderate right pleural effusion. Consolidation of the right lung base is identified. Multiple overlying artifacts are noted. Mediastinal contour and cardiac silhouette are stable.  IMPRESSION: Severe pulmonary edema.  Moderate right pleural effusion.   Electronically Signed   By: Sherian Rein M.D.   On: 09/21/2013 17:45   Dg Knee Right Port  09/21/2013   CLINICAL DATA:  Pain  EXAM: PORTABLE RIGHT KNEE - 1-2 VIEW  COMPARISON:  11/13/2006  FINDINGS: There is severe loss of joint space and there is cystic subchondral degenerative change. There is a joint effusion. There are well circumscribed erosive changes seen in the anterior condyles as well as along the medial femoral condyle.  IMPRESSION: Severe degenerative change, with findings similar to prior study although advanced in severity. As previously indicated, these findings would be consistent with the clinical diagnosis of rheumatoid arthritis.   Electronically Signed   By: Esperanza Heir M.D.   On: 09/21/2013 17:50   Medications: I have reviewed the patient's current medications. Scheduled Meds: . calcium carbonate  2 tablet Oral BID  . feeding supplement (ENSURE COMPLETE)  237 mL Oral BID BM  . furosemide  40 mg Oral BID  . latanoprost  1 drop Both Eyes QHS  . pantoprazole  40 mg Oral Q1200  . piperacillin-tazobactam (ZOSYN)  IV  3.375 g Intravenous Q8H  . [START ON 09/23/2013] predniSONE  20 mg Oral Q breakfast  . Rivaroxaban  15 mg Oral Q supper  . sodium chloride  3 mL Intravenous Q12H  . vancomycin  750 mg Intravenous Q24H     PRN Meds:.acetaminophen, ALPRAZolam, atropine, HYDROcodone-acetaminophen Assessment/Plan: #HCAP- portable CXR on admission  showed severe pulmonary edema with moderate right pleural effusion and consolidation in right lung base. Repeat CXR today showed improving aeration of lungs with decreasing bilateral airspace disease; residual right lung airspace disease; right effusion also decreased in size. CURB65 score of 2 (6.8% 30 day mortality). Abfebrile, no leucocytosis,  does not meet SIRS criteria. Lactic acid 8.91 --> 3.8. Satting 100% on RA though patient states she wears oxygen at home.  UA also shows evidence of asymptomatic bacteruria. - continue vancomycin, zosyn  - blood cultures x 2 pending  -hold off on IVFs given pulmonary edema  -CBC, BMP, Mg, lactic acid in AM  -consider palliative care consult, patient has poor prognosis  #Anion gap metabolic acidosis- AG 13, likely due to lactic acidosis given lactic acid 8.9, now resolving (3.8). ABG attempted twice but unsuccessful.   -blood cultures x 2 pending  #dCHF- Recently admitted in 05/2013 for acute on chronic dCHF. Echo at that time showed EF 60-65%, normal systolic function, wall motion normal, grade 1 diastolic dysfunction, severe pulmonary hypertension. pBNP 1389 (3679 in 05/2013), no evidence of volume overload on exam (no crackles, no LE edema). However, portable CXR shows severe pulmonary edema with moderate right pleural effusion, both improved on repeat imaging.  -cardiology consult (outpatient cardiologist)  -restart home Lasix 40 mg PO BID -avoid IVFs   #Bradycardia, resolved, now tachycardia- Likely due to sick sinus syndrome.  Asymptomatic but bradycardic to 30s on admission exam, also intermittently tachycardic.  Became bradycardic to 20s, symptomatic in ED, gave atropine and calcium gluconate then HR went back into 50s. Troponins x 3 negative. Now tachycardic to 110s-120s. -cardiology following  -start diltiazem 30 mg q6h per cardiology -atropine IV 0.5 mg prn   #RA flare- Patient having increased pain in right arm and hand, right leg from baseline.  She was diagnosed with RA >20 years ago, patient takes Tylenol at home for pain. ESR elevated at 82 on presentation. Right arm and hand swollen, but patient has history of CVA and lies on right side 24/7. Patient also has OA and gout but less likely given above. Right knee xray showed severe degenerative change with findings similar to prior study though advanced in severity, consistent with RA. RUE Doppler negative.  Acetaminophen, salicylate levels within normal limits.  -prednisone 20 mg daily for now, will taper to 15 mg tomorrow if pain improved -Norco 5-325 q8h prn pain   #Hyperkalemia- 5.5, patient received kayexelate 15 g in ED. Repeat BMP showed K 6.5 thus patient given another kayexelate 15 g overnight.  K 5.8 on AM labs.  -repeat BMP in AM  #History of atrial fibrillation- sinus rhythm now, on Xarelto for anticoagulation.  -continue Xarelto   Dispo: Disposition is deferred at this time, awaiting improvement of current medical problems.  Anticipated discharge in approximately 1-2 day(s).   The patient does have a current PCP Evelena Peat, DO) and does need an [facility] hospital follow-up appointment after discharge.   .Services Needed at time of discharge: Y = Yes, Blank = No PT:   OT:   RN:   Equipment:   Other:     LOS: 1 day   Rocco Serene, MD 09/22/2013, 1:19 PM

## 2013-09-22 NOTE — H&P (Signed)
Internal Medicine Attending Admission Note Date: 09/22/2013  Patient name: Kim [name] Medical record number: [id number] Date of birth: 1929/09/14 Age: 79 y.o. Gender: female  I saw and evaluated the patient. I reviewed the resident's note and I agree with the resident's findings and plan as documented in the resident's note.  Kim [name] is an 79 year old woman with a history of paroxysmal atrial fibrillation, grade 1 diastolic heart failure, rheumatoid arthritis, and pulmonary hypertension who presents with a several day history of worsening pain throughout her body. She has over a 20 year history of rheumatoid arthritis but apparently has not been treated with any disease modifying agents. She has had periodic prednisone burst with taper with improvement of her symptoms. Generally, she is satisfied with taking Tylenol as needed. As of late, the Tylenol has not given her any relief and she presents to the emergency department complaining of right hand and arm pain that is worse than usual. She also notes worsening in her right hand swelling and left hand swelling. She denies any palpitations or chest pain. She had mild shortness of breath but when seen this morning on rounds she was breathing comfortably lying on her right side without any dyspnea.  The true reason for admission was bradycardia into the 20s that was asymptomatic. As mentioned above, she has a history of paroxysmal atrial fibrillation. For this she has been treated with 2 nodal agents including metoprolol and diltiazem. She has no history of bradycardia and has not formally been diagnosed with sick sinus syndrome. She reportedly received 2.5 L of normal saline in the emergency department and a subsequent chest x-ray showed bilateral pulmonary edema with a right sided effusion. She was mildly diuresed and IV fluids were stopped. This morning her chest x-ray shows marked improvement in her edema although she continues to have a  right-sided pleural effusion with an elevated right hemidiaphragm and left basilar Kerly B-lines.  On examination she has marked swelling of her right upper extremity, especially her hand which has mild ulnar deviation and significant contractures. Her left hand is also edematous. Her jugular venous pressure is at least 9 cm of water and she has some right basilar inspiratory crackles. She was also tachycardic on exam this morning.  Kim [name] is an 79 year old woman with a history of paroxysmal atrial fibrillation requiring 2 nodal agents, grade 1 diastolic heart failure, rheumatoid arthritis that has never been treated with a disease modifying agent, and pulmonary hypertension who presents with a flare of her rheumatoid arthritis resulting in pain. She was also found to have bradycardia which was asymptomatic and was hydrated aggressively resulting in an acute on chronic diastolic heart failure.  We will give Kim [name] 1 additional dose of IV Lasix and then convert her to her oral Lasix thereafter. We will keep an eye on her afterload and aggressively treat it to avoid it re-exacerbation of her diastolic heart failure. We will restart her metoprolol given her persistent tachycardia recently but at a low dose of 12.5 mg by mouth twice a day. We will titrate it upwards as tolerated. I suspect her bradycardia may have been related to double AV nodal blockade and we will follow her heart rate on telemetry with the reintroduction of the low-dose beta blocker. It is hoped that we will be able to control her rate with a single agent. Another reason to keep her on telemetry is to assess for evidence of nodal pauses which may suggest tachybradycardia syndrome, and if the pauses  are of significant length may require a permanent pacemaker., Once again it is hoped this is not the case. With regards to her pain, it is most likely related to her rheumatoid arthritis. Clinically she is quite frail and may not  tolerate a disease modifying agent, at least acutely. We will therefore give a dose of prednisone 20 mg by mouth once and assess her response. We will dose it on a daily basis and decrease the dose to the lowest effective dose where she will be discharged on. The edema in her right arm is almost assuredly related to the fact that she preferentially lays with the right side down almost in a right lateral decubitus position. If her pain is improved with the prednisone therapy she may feel more like sitting upright which may help with that dependent edema. She also may be able to participate more in physical therapy. This is important as we will need to assess if she would like the services of a skilled nursing facility prior to returning home.

## 2013-09-22 NOTE — Progress Notes (Signed)
CRITICAL VALUE ALERT  Critical value received:  6.9 hgb  Date of notification:  09/22/13  Time of notification:  2130  Critical value read back:yes  MD notified (1st page): Dr. Waynard Reeds  Time of first page:  2131  Responding MD:   Dr. Waynard Reeds  Time MD responded:  2132

## 2013-09-22 NOTE — Consult Note (Signed)
Subjective:  Less short of breath. Afebrile. Hyperkalemia persist.  Objective:  Vital Signs in the last 24 hours: Temp:  [97.2 F (36.2 C)-98.3 F (36.8 C)] 98.3 F (36.8 C) (11/23 1100) Pulse Rate:  [27-125] 27 (11/23 0500) Cardiac Rhythm:  [-] Atrial fibrillation (11/23 0700) Resp:  [11-31] 16 (11/23 0400) BP: (86-143)/(31-76) 127/71 mmHg (11/23 0700) SpO2:  [81 %-100 %] 100 % (11/23 0728) Weight:  [54.159 kg (119 lb 6.4 oz)-54.296 kg (119 lb 11.2 oz)] 54.296 kg (119 lb 11.2 oz) (11/23 0458)  Physical Exam: BP Readings from Last 1 Encounters:  09/22/13 127/71     Wt Readings from Last 1 Encounters:  09/22/13 54.296 kg (119 lb 11.2 oz)    Weight change:   HEENT: [place]/AT, Eyes-Brown, PERL, EOMI, Conjunctiva-Pale, Sclera-Non-icteric Neck: No JVD, No bruit, Trachea midline. Lungs:  Few basal crackles, Bilateral. Cardiac:  Regular rhythm, normal S1 and S2, no S3. II/VI systolic murmur.  Abdomen:  Soft, non-tender. Extremities:  Right upper extremity edema present. No cyanosis. No clubbing. CNS: AxOx3, Cranial nerves grossly intact, moves all 4 extremities. Right handed. Skin: Warm and dry.   Intake/Output from previous day: 11/22 0701 - 11/23 0700 In: 872 [P.O.:822; IV Piggyback:50] Out: 100 [Urine:100]    Lab Results: BMET    Component Value Date/Time   NA 135 09/21/2013 2200   K 5.8* 09/21/2013 2200   CL 102 09/21/2013 2200   CO2 22 09/21/2013 2200   GLUCOSE 220* 09/21/2013 2200   BUN 26* 09/21/2013 2200   CREATININE 1.15* 09/21/2013 2200   CREATININE 1.65* 12/16/2011 1725   CALCIUM 8.5 09/21/2013 2200   GFRNONAA 43* 09/21/2013 2200   GFRAA 50* 09/21/2013 2200   CBC    Component Value Date/Time   WBC 9.9 09/21/2013 1150   RBC 3.07* 09/21/2013 1150   RBC 3.58* 11/04/2011 0620   HGB 8.5* 09/21/2013 1150   HCT 27.4* 09/21/2013 1150   PLT 425* 09/21/2013 1150   MCV 89.3 09/21/2013 1150   MCH 27.7 09/21/2013 1150   MCHC 31.0 09/21/2013 1150   RDW 17.8*  09/21/2013 1150   LYMPHSABS 1.0 09/21/2013 1150   MONOABS 1.0 09/21/2013 1150   EOSABS 0.1 09/21/2013 1150   BASOSABS 0.0 09/21/2013 1150   CARDIAC ENZYMES Lab Results  Component Value Date   CKTOTAL 106 10/14/2011   CKMB 2.6 10/14/2011   TROPONINI <0.30 09/21/2013    Scheduled Meds: . calcium carbonate  2 tablet Oral BID  . feeding supplement (ENSURE COMPLETE)  237 mL Oral BID BM  . furosemide  40 mg Oral BID  . latanoprost  1 drop Both Eyes QHS  . pantoprazole  40 mg Oral Q1200  . piperacillin-tazobactam (ZOSYN)  IV  3.375 g Intravenous Q8H  . [START ON 09/23/2013] predniSONE  20 mg Oral Q breakfast  . Rivaroxaban  15 mg Oral Q supper  . sodium chloride  3 mL Intravenous Q12H  . vancomycin  750 mg Intravenous Q24H   Continuous Infusions:  PRN Meds:.acetaminophen, ALPRAZolam, atropine, HYDROcodone-acetaminophen  Assessment/Plan: Atrial fibrillation with intermittent high grade AV block  Chronic Left heart systolic and diastolic heart failure  Hyperkalemia  Chronic anemia, blood loss and chronic disease  Chronic pulmonary fibrosis  Chronic pulmonary hypertension  Rheumatoid arthritis and gout  Right upper extremity swelling  May need small dose diltiazem if HR remains near 100.   LOS: 1 day    Orpah Cobb  MD  09/22/2013, 12:59 PM

## 2013-09-23 ENCOUNTER — Inpatient Hospital Stay (HOSPITAL_COMMUNITY): Payer: Medicare Other

## 2013-09-23 DIAGNOSIS — I5033 Acute on chronic diastolic (congestive) heart failure: Secondary | ICD-10-CM

## 2013-09-23 DIAGNOSIS — E872 Acidosis: Secondary | ICD-10-CM

## 2013-09-23 DIAGNOSIS — M069 Rheumatoid arthritis, unspecified: Principal | ICD-10-CM

## 2013-09-23 DIAGNOSIS — N179 Acute kidney failure, unspecified: Secondary | ICD-10-CM

## 2013-09-23 LAB — CBC
Hemoglobin: 7.7 g/dL — ABNORMAL LOW (ref 12.0–15.0)
MCH: 27.1 pg (ref 26.0–34.0)
Platelets: 433 10*3/uL — ABNORMAL HIGH (ref 150–400)
RBC: 2.84 MIL/uL — ABNORMAL LOW (ref 3.87–5.11)
RDW: 18.4 % — ABNORMAL HIGH (ref 11.5–15.5)
WBC: 8.4 10*3/uL (ref 4.0–10.5)

## 2013-09-23 LAB — URINE CULTURE: Colony Count: 50000

## 2013-09-23 LAB — LACTIC ACID, PLASMA: Lactic Acid, Venous: 3.3 mmol/L — ABNORMAL HIGH (ref 0.5–2.2)

## 2013-09-23 LAB — BASIC METABOLIC PANEL
Calcium: 8.5 mg/dL (ref 8.4–10.5)
GFR calc Af Amer: 37 mL/min — ABNORMAL LOW (ref >90)
GFR calc non Af Amer: 32 mL/min — ABNORMAL LOW (ref >90)
Potassium: 4.2 mEq/L (ref 3.5–5.1)
Sodium: 136 mEq/L (ref 135–145)

## 2013-09-23 LAB — MAGNESIUM: Magnesium: 1.9 mg/dL (ref 1.5–2.5)

## 2013-09-23 MED ORDER — METOPROLOL TARTRATE 1 MG/ML IV SOLN: 2.5000 mg | Freq: Once | INTRAVENOUS | Status: DC

## 2013-09-23 MED ORDER — DILTIAZEM LOAD VIA INFUSION
10.0000 mg | Freq: Once | INTRAVENOUS | Status: AC
  Administered 2013-09-23: 10 mg via INTRAVENOUS
  Filled 2013-09-23: qty 10

## 2013-09-23 MED ORDER — SODIUM CHLORIDE 0.9 % IV SOLN: INTRAVENOUS | Status: DC

## 2013-09-23 MED ORDER — METOPROLOL TARTRATE 1 MG/ML IV SOLN
2.5000 mg | Freq: Once | INTRAVENOUS | Status: AC
  Administered 2013-09-23: 2.5 mg via INTRAVENOUS

## 2013-09-23 MED ORDER — PREDNISONE 5 MG PO TABS
15.0000 mg | ORAL_TABLET | Freq: Every day | ORAL | Status: DC
  Administered 2013-09-24: 15 mg via ORAL
  Filled 2013-09-23 (×2): qty 1

## 2013-09-23 MED ORDER — DILTIAZEM HCL 100 MG IV SOLR
5.0000 mg/h | INTRAVENOUS | Status: DC
  Administered 2013-09-23: 5 mg/h via INTRAVENOUS
  Administered 2013-09-23: 15 mg/h via INTRAVENOUS
  Administered 2013-09-24: 20 mg/h via INTRAVENOUS
  Filled 2013-09-23 (×5): qty 100

## 2013-09-23 MED ORDER — METOPROLOL TARTRATE 1 MG/ML IV SOLN
2.5000 mg | Freq: Once | INTRAVENOUS | Status: AC
  Administered 2013-09-23: 2.5 mg via INTRAVENOUS
  Filled 2013-09-23: qty 5

## 2013-09-23 MED ORDER — MECLIZINE HCL 12.5 MG PO TABS
12.5000 mg | ORAL_TABLET | Freq: Two times a day (BID) | ORAL | Status: DC | PRN
Start: 2013-09-23 — End: 2013-09-25
  Administered 2013-09-23 – 2013-09-24 (×2): 12.5 mg via ORAL
  Filled 2013-09-23 (×2): qty 1

## 2013-09-23 MED ORDER — DILTIAZEM HCL 60 MG PO TABS
60.0000 mg | ORAL_TABLET | Freq: Four times a day (QID) | ORAL | Status: DC
  Administered 2013-09-23: 60 mg via ORAL
  Filled 2013-09-23 (×4): qty 1

## 2013-09-23 MED ORDER — BIOTENE DRY MOUTH MT LIQD
15.0000 mL | Freq: Two times a day (BID) | OROMUCOSAL | Status: DC
  Administered 2013-09-23 – 2013-09-25 (×5): 15 mL via OROMUCOSAL

## 2013-09-23 MED ORDER — DILTIAZEM HCL 25 MG/5ML IV SOLN
5.0000 mg | Freq: Once | INTRAVENOUS | Status: AC
  Administered 2013-09-23: 5 mg via INTRAVENOUS
  Filled 2013-09-23: qty 5

## 2013-09-23 MED ORDER — METOPROLOL SUCCINATE ER 25 MG PO TB24
25.0000 mg | ORAL_TABLET | Freq: Every day | ORAL | Status: DC
  Administered 2013-09-23: 25 mg via ORAL
  Filled 2013-09-23: qty 1

## 2013-09-23 MED FILL — Medication: Qty: 1 | Status: AC

## 2013-09-23 NOTE — Progress Notes (Signed)
  Date: 09/23/2013  Patient name: Kim [name]  Medical record number: [id number]  Date of birth: 1929/01/11   This patient has been seen and the plan of care was discussed with the house staff. Please see their note for complete details. I concur with their findings with the following additions/corrections: Noted to have persistent Afib w/ RVR. I would discuss with cardiology initiating a dilt gtt to target a HR less than 120 (she was 140 bpm on my exam).  She denies CP. I don't think she has HCAP. Initial presentation compatible with pulmonary edema. Unfortunately, given her significant pulm HTN and HFpEF/HFrEF this will be difficult to control.  Of additional concern is her AKI. She is noted to have worsening Cr daily. This may represent cardiorenal syndrome. Would discuss with cardiology.  Jonah Blue, DO, FACP Faculty [facility] Internal Medicine Residency Program 09/23/2013, 12:19 PM

## 2013-09-23 NOTE — Consult Note (Signed)
Subjective:  Wants to go home. Mild shortness of breath. HR in 130's. Possible atrial flutter with 2:1 conduction. Afebrile.  Objective:  Vital Signs in the last 24 hours: Temp:  [97.1 F (36.2 C)-98.3 F (36.8 C)] 97.4 F (36.3 C) (11/24 0812) Pulse Rate:  [138] 138 (11/24 0812) Cardiac Rhythm:  [-] Atrial fibrillation (11/23 1953) Resp:  [14-24] 17 (11/24 0812) BP: (108-144)/(57-96) 108/57 mmHg (11/24 0812) SpO2:  [100 %] 100 % (11/24 0812) Weight:  [55.974 kg (123 lb 6.4 oz)] 55.974 kg (123 lb 6.4 oz) (11/24 0500)  Physical Exam: BP Readings from Last 1 Encounters:  09/23/13 108/57     Wt Readings from Last 1 Encounters:  09/23/13 55.974 kg (123 lb 6.4 oz)    Weight change: 1.814 kg (4 lb)  HEENT: [place]/AT, Eyes-Brown, PERL, EOMI, Conjunctiva-Pale, Sclera-Non-icteric Neck: No JVD, No bruit, Trachea midline. Lungs:  Few basal crackles, Bilateral. Cardiac:  Regular rhythm, normal S1 and S2, no S3.  Abdomen:  Soft, non-tender. Extremities:  Right arm  edema present. No cyanosis. No clubbing. + arthritic deformities of both hands. CNS: AxOx3, Cranial nerves grossly intact, moves all 4 extremities. Right handed. Skin: Warm and dry.   Intake/Output from previous day: 11/23 0701 - 11/24 0700 In: 1260 [P.O.:960; IV Piggyback:300] Out: 625 [Urine:625]    Lab Results: BMET    Component Value Date/Time   NA 136 09/23/2013 0710   K 4.2 09/23/2013 0710   CL 98 09/23/2013 0710   CO2 23 09/23/2013 0710   GLUCOSE 130* 09/23/2013 0710   BUN 41* 09/23/2013 0710   CREATININE 1.46* 09/23/2013 0710   CREATININE 1.65* 12/16/2011 1725   CALCIUM 8.5 09/23/2013 0710   GFRNONAA 32* 09/23/2013 0710   GFRAA 37* 09/23/2013 0710   CBC    Component Value Date/Time   WBC 8.4 09/23/2013 0710   RBC 2.84* 09/23/2013 0710   RBC 3.58* 11/04/2011 0620   HGB 7.7* 09/23/2013 0710   HCT 25.4* 09/23/2013 0710   PLT 433* 09/23/2013 0710   MCV 89.4 09/23/2013 0710   MCH 27.1 09/23/2013 0710   MCHC 30.3 09/23/2013 0710   RDW 18.4* 09/23/2013 0710   LYMPHSABS 0.6* 09/22/2013 1305   MONOABS 0.8 09/22/2013 1305   EOSABS 0.0 09/22/2013 1305   BASOSABS 0.0 09/22/2013 1305   CARDIAC ENZYMES Lab Results  Component Value Date   CKTOTAL 106 10/14/2011   CKMB 2.6 10/14/2011   TROPONINI <0.30 09/22/2013    Scheduled Meds: . calcium carbonate  2 tablet Oral BID  . diltiazem  60 mg Oral Q6H  . feeding supplement (ENSURE COMPLETE)  237 mL Oral BID BM  . furosemide  40 mg Oral BID  . latanoprost  1 drop Both Eyes QHS  . metoprolol  2.5 mg Intravenous Once  . metoprolol succinate  25 mg Oral Daily  . pantoprazole  40 mg Oral Q1200  . piperacillin-tazobactam (ZOSYN)  IV  3.375 g Intravenous Q8H  . Rivaroxaban  15 mg Oral Q supper  . sodium chloride  3 mL Intravenous Q12H  . vancomycin  750 mg Intravenous Q24H   Continuous Infusions:  PRN Meds:.acetaminophen, ALPRAZolam, atropine, HYDROcodone-acetaminophen, meclizine  Assessment/Plan: Atrial fibrillation with intermittent high grade AV block  Possible atrial flutter with 2:1 conduction Chronic Left heart systolic and diastolic heart failure  Hyperkalemia-resolved  Chronic anemia, blood loss and chronic disease  Chronic pulmonary fibrosis  Chronic pulmonary hypertension  Rheumatoid arthritis and gout  Right upper extremity swelling  Cardizem increased to 60  mg. q 6 hr. Small dose B-blocker for HR control.   LOS: 2 days    Orpah Cobb  MD  09/23/2013, 9:09 AM

## 2013-09-23 NOTE — Progress Notes (Signed)
PT Cancellation Note  Patient Details Name: Kim [name] MRN: [id number] DOB: [birth year]   Cancelled Treatment:    Reason Eval/Treat Not Completed: Medical issues which prohibited therapy (pt currently with HR 140 at rest) Noted Dr.Paya note desiring rate below 120 will hold at this time until medically stable for mobility.   [name] 09/23/2013, 1:11 PM [name], PT [phone]

## 2013-09-23 NOTE — Progress Notes (Signed)
Subjective: Ms. Crownover is doing about the same this morning.  States the her pain is much improved after steroids yesterday.   Atrial fibrillation with rate in 130s on monitor.   Objective: Vital signs in last 24 hours: Filed Vitals:   09/22/13 2301 09/23/13 0500 09/23/13 0540 09/23/13 0541  BP: 123/96  144/87 144/87  Pulse:      Temp: 97.1 F (36.2 C)     TempSrc: Oral     Resp: 23   19  Height:      Weight:  123 lb 6.4 oz (55.974 kg)    SpO2:       Weight change: 4 lb (1.814 kg)  Intake/Output Summary (Last 24 hours) at 09/23/13 0819 Last data filed at 09/23/13 0800  Gross per 24 hour  Intake   1020 ml  Output    825 ml  Net    195 ml   PEX General: alert, cooperative, and in no apparent distress HEENT: NCAT Neck: supple, no lymphadenopathy Lungs: clear to ascultation except for rales in right lung base, normal work of respiration, no wheezes, ronchi Heart: tachycardic, irregular rate and rhythm, no murmurs, gallops, or rubs Abdomen: soft, non-tender, non-distended, normal bowel sounds Extremities: right arm and hand extremely swollen but no erythema or drainage, right knee TTP but no erythema; 2+ DP/PT pulses bilaterally, no cyanosis, clubbing, or edema Neurologic: alert & oriented X3, cranial nerves II-XII intact, strength grossly intact, sensation intact to light touch  Lab Results: Basic Metabolic Panel:  Recent Labs Lab 09/21/13 2200 09/22/13 1305 09/23/13 0710  NA 135 138 136  K 5.8* 4.3 4.2  CL 102 101 98  CO2 22 24 23   GLUCOSE 220* 130* 130*  BUN 26* 32* 41*  CREATININE 1.15* 1.28* 1.46*  CALCIUM 8.5 8.1* 8.5  MG 1.8 1.9 1.9  PHOS  --  4.2  --    CBC:  Recent Labs Lab 09/21/13 1150 09/22/13 1305 09/22/13 2100 09/23/13 0710  WBC 9.9 9.2 9.0 8.4  NEUTROABS 7.8* 7.8*  --   --   HGB 8.5* 7.0* 6.9* 7.7*  HCT 27.4* 21.7* 22.5* 25.4*  MCV 89.3 86.5 88.6 89.4  PLT 425* 370 379 433*   Cardiac Enzymes:  Recent Labs Lab 09/21/13 2200  09/22/13 1310 09/22/13 2100  TROPONINI <0.30 <0.30 <0.30   BNP:  Recent Labs Lab 09/21/13 1653  PROBNP 1389.0*   Urine Drug Screen: Drugs of Abuse     Component Value Date/Time   LABOPIA NONE DETECTED 01/26/2010 1130   COCAINSCRNUR NONE DETECTED 01/26/2010 1130   LABBENZ POSITIVE* 01/26/2010 1130   AMPHETMU NONE DETECTED 01/26/2010 1130   THCU NONE DETECTED 01/26/2010 1130   LABBARB  Value: NONE DETECTED        DRUG SCREEN FOR MEDICAL PURPOSES ONLY.  IF CONFIRMATION IS NEEDED FOR ANY PURPOSE, NOTIFY LAB WITHIN 5 DAYS.        LOWEST DETECTABLE LIMITS FOR URINE DRUG SCREEN Drug Class       Cutoff (ng/mL) Amphetamine      1000 Barbiturate      200 Benzodiazepine   200 Tricyclics       300 Opiates          300 Cocaine          300 THC              50 01/26/2010 1130    Urinalysis:  Recent Labs Lab 09/21/13 2055  COLORURINE AMBER*  LABSPEC 1.030  PHURINE  5.0  GLUCOSEU NEGATIVE  HGBUR MODERATE*  BILIRUBINUR SMALL*  KETONESUR NEGATIVE  PROTEINUR 30*  UROBILINOGEN 1.0  NITRITE NEGATIVE  LEUKOCYTESUR MODERATE*    Micro Results: Recent Results (from the past 240 hour(s))  MRSA PCR SCREENING     Status: None   Collection Time    09/21/13  8:33 PM      Result Value Range Status   MRSA by PCR NEGATIVE  NEGATIVE Final   Comment:            The GeneXpert MRSA Assay (FDA     approved for NASAL specimens     only), is one component of a     comprehensive MRSA colonization     surveillance program. It is not     intended to diagnose MRSA     infection nor to guide or     monitor treatment for     MRSA infections.  URINE CULTURE     Status: None   Collection Time    09/21/13  8:55 PM      Result Value Range Status   Specimen Description URINE, CLEAN CATCH   Final   Special Requests NONE   Final   Culture  Setup Time     Final   Value: 09/22/2013 02:05     Performed at [facility] Count     Final   Value: 50,000 COLONIES/ML     Performed at [facility]   Culture     Final   Value: Multiple bacterial morphotypes present, none predominant. Suggest appropriate recollection if clinically indicated.     Performed at [facility]   Report Status 09/23/2013 FINAL   Final   Studies/Results: Dg Chest Port 1 View  09/22/2013   CLINICAL DATA:  Pulmonary edema.  EXAM: PORTABLE CHEST - 1 VIEW  COMPARISON:  09/21/2013  FINDINGS: Mild cardiomegaly. Improving aeration in the lungs. Moderate residual right lung airspace disease and right effusion. The right effusion is also decreased since prior study. Mild left base atelectasis. No acute bony abnormality.  IMPRESSION: Improving aeration of the lungs with decreasing bilateral airspace disease. Moderate residual right lung airspace disease and right effusion. The right effusion has also decreased.  Stable elevation of the right hemidiaphragm.  Mild cardiomegaly.   Electronically Signed   By: Charlett Nose M.D.   On: 09/22/2013 11:30   Dg Chest Port 1 View  09/21/2013   CLINICAL DATA:  Chest pain  EXAM: PORTABLE CHEST - 1 VIEW  COMPARISON:  July 01, 2013  FINDINGS: There is severe pulmonary edema. There is a moderate right pleural effusion. Consolidation of the right lung base is identified. Multiple overlying artifacts are noted. Mediastinal contour and cardiac silhouette are stable.  IMPRESSION: Severe pulmonary edema.  Moderate right pleural effusion.   Electronically Signed   By: Sherian Rein M.D.   On: 09/21/2013 17:45   Dg Knee Right Port  09/21/2013   CLINICAL DATA:  Pain  EXAM: PORTABLE RIGHT KNEE - 1-2 VIEW  COMPARISON:  11/13/2006  FINDINGS: There is severe loss of joint space and there is cystic subchondral degenerative change. There is a joint effusion. There are well circumscribed erosive changes seen in the anterior condyles as well as along the medial femoral condyle.  IMPRESSION: Severe degenerative change, with findings similar to prior study although advanced in severity. As  previously indicated, these findings would be consistent with the clinical diagnosis of rheumatoid arthritis.  Electronically Signed   By: Esperanza Heir M.D.   On: 09/21/2013 17:50   Medications: I have reviewed the patient's current medications. Scheduled Meds: . calcium carbonate  2 tablet Oral BID  . diltiazem  30 mg Oral Q6H  . feeding supplement (ENSURE COMPLETE)  237 mL Oral BID BM  . furosemide  40 mg Oral BID  . latanoprost  1 drop Both Eyes QHS  . pantoprazole  40 mg Oral Q1200  . piperacillin-tazobactam (ZOSYN)  IV  3.375 g Intravenous Q8H  . Rivaroxaban  15 mg Oral Q supper  . sodium chloride  3 mL Intravenous Q12H  . vancomycin  750 mg Intravenous Q24H     PRN Meds:.acetaminophen, ALPRAZolam, atropine, HYDROcodone-acetaminophen Assessment/Plan: #Atrial fibrillation with RVR- rate now high 130s-low 140s. Patient has history of atrial fibrillation, on Xarelto at home.  Of note, patient became bradycardic to 20s, symptomatic in ED, gave atropine and calcium gluconate then HR went back into 50s. Troponins x 3 negative.  - cardiology consult (outpatient cardiologist Dr. Algie Coffer) - diltiazem gtt with 10 mg bolus, 5 mg/hour - continue Xarelto - continue to monitor  #dCHF- portable CXR on admission showed severe pulmonary edema with moderate right pleural effusion and consolidation in right lung base. Repeat CXR 11/23 showed improving aeration of lungs with decreasing bilateral airspace disease; right effusion also decreased in size. CURB65 score of 2 (6.8% 30 day mortality).  Additional repeat CXR 11/24 showed Minimal vascular congestion without overt edema. Abfebrile, no leucocytosis, does not meet SIRS criteria. Lactic acid 8.91 --> 3.8 --> 3.3. Satting 100% on RA though patient states she wears oxygen at home.  Recently admitted in 05/2013 for acute on chronic dCHF. Echo at that time showed EF 60-65%, normal systolic function, wall motion normal, grade 1 diastolic dysfunction,  severe pulmonary hypertension. pBNP 1389 (3679 in 05/2013), no evidence of volume overload on exam (no crackles, no LE edema).  Patient has poor prognosis.  - continue home Lasix 40 mg PO BID - hold off on IVFs given pulmonary edema  - discontinue vancomycin, zosyn   #AKI- Cr up to 1.46 today (0.97 on admission).  - avoiding IVFs for now given above - renal function panel in AM  #Anion gap metabolic acidosis, resolving- AG 15, likely due to lactic acidosis given lactic acid 8.9, now resolving (3.3). ABG attempted twice but unsuccessful.   -blood cultures x 2 NGTD, pending  #RA flare- Patient having increased pain in right arm and hand, right leg from baseline. She was diagnosed with RA >20 years ago, patient takes Tylenol at home for pain. ESR elevated at 82 on presentation. Right arm and hand swollen, but patient has history of CVA and lies on right side 24/7. Patient also has OA and gout but less likely given above. Right knee xray showed severe degenerative change with findings similar to prior study though advanced in severity, consistent with RA. RUE Doppler negative.  Acetaminophen, salicylate levels within normal limits.   -prednisone 15 mg daily for now, will taper to 10 mg tomorrow if pain still improved -Norco 5-325 q8h prn pain   #Hyperkalemia, resolved- 4.2 today.  Patient received kayexelate 15 g in ED. Repeat BMP showed K 6.5 thus patient given another kayexelate 15 g.  -repeat BMP in AM   Dispo: Disposition is deferred at this time, awaiting improvement of current medical problems.  Anticipated discharge in approximately 2-3 day(s).   The patient does have a current PCP Evelena Peat, DO) and does  need an [facility] hospital follow-up appointment after discharge.   .Services Needed at time of discharge: Y = Yes, Blank = No PT:   OT:   RN:   Equipment:   Other:     LOS: 2 days   Rocco Serene, MD 09/23/2013, 8:19 AM

## 2013-09-24 DIAGNOSIS — I4891 Unspecified atrial fibrillation: Secondary | ICD-10-CM

## 2013-09-24 DIAGNOSIS — I498 Other specified cardiac arrhythmias: Secondary | ICD-10-CM

## 2013-09-24 DIAGNOSIS — R609 Edema, unspecified: Secondary | ICD-10-CM

## 2013-09-24 LAB — LACTIC ACID, PLASMA: Lactic Acid, Venous: 2.3 mmol/L — ABNORMAL HIGH (ref 0.5–2.2)

## 2013-09-24 LAB — RENAL FUNCTION PANEL
CO2: 24 mEq/L (ref 19–32)
Calcium: 8.2 mg/dL — ABNORMAL LOW (ref 8.4–10.5)
GFR calc Af Amer: 36 mL/min — ABNORMAL LOW (ref >90)
GFR calc non Af Amer: 31 mL/min — ABNORMAL LOW (ref >90)
Potassium: 3.8 mEq/L (ref 3.5–5.1)
Sodium: 137 mEq/L (ref 135–145)

## 2013-09-24 MED ORDER — DILTIAZEM HCL ER COATED BEADS 240 MG PO CP24
240.0000 mg | ORAL_CAPSULE | Freq: Every day | ORAL | Status: DC
  Administered 2013-09-24 – 2013-09-25 (×2): 240 mg via ORAL
  Filled 2013-09-24 (×2): qty 1

## 2013-09-24 MED ORDER — METOPROLOL TARTRATE 1 MG/ML IV SOLN
5.0000 mg | INTRAVENOUS | Status: DC | PRN
  Administered 2013-09-24 (×2): 5 mg via INTRAVENOUS
  Filled 2013-09-24 (×2): qty 5

## 2013-09-24 MED ORDER — FUROSEMIDE 40 MG PO TABS: 40.0000 mg | ORAL_TABLET | Freq: Every day | ORAL | Status: DC

## 2013-09-24 MED ORDER — PREDNISONE 10 MG PO TABS
10.0000 mg | ORAL_TABLET | Freq: Every day | ORAL | Status: DC
  Administered 2013-09-25: 10 mg via ORAL
  Filled 2013-09-24 (×2): qty 1

## 2013-09-24 MED ORDER — FUROSEMIDE 40 MG PO TABS
40.0000 mg | ORAL_TABLET | Freq: Two times a day (BID) | ORAL | Status: DC
  Administered 2013-09-24 – 2013-09-25 (×2): 40 mg via ORAL
  Filled 2013-09-24 (×2): qty 1

## 2013-09-24 MED ORDER — METOPROLOL SUCCINATE ER 50 MG PO TB24
50.0000 mg | ORAL_TABLET | Freq: Every day | ORAL | Status: DC
  Administered 2013-09-24 – 2013-09-25 (×2): 50 mg via ORAL
  Filled 2013-09-24 (×2): qty 1

## 2013-09-24 NOTE — Consult Note (Signed)
ELECTROPHYSIOLOGY CONSULT NOTE    Patient ID: Kim [name] MRN: [id number], DOB/AGE: 12-29-1928 79 y.o.  Admit date: 09/21/2013 Date of Consult: 09-24-2013  Primary Physician: Evelena Peat, DO Primary Cardiologist: Orpah Cobb, MD  Reason for Consultation: atrial fibrillation  HPI:  Kim [name] is a 79 year old female with a past medical history significant for atrial fibrillation, diastolic heart failure, rhematoid arthritis, and pulmonary hypertension.  She presented to the ER on the day of admission for worsening leg pain.  While in the ER, she was noted to have periods of bradycardia with rates into the 30's and was admitted for further evaluation.  Upon holding her home AV nodal blocking agents, she had recurrant afib with RVR. Her Cardizem has been restarted as well as low dose Metoprolol.  EP has been asked to evaluate for treatment options.  She states she was initially diagnosed with atrial fibrillation 9-10 years ago.  She tolerates this rhythm fairly well and is minimally symptomatic from a CV perspective.    Echocardiogram this admission demonstrated an EF of 60-65%, no RWMA, severe TR, PA pressure of 84, LA 35. Lab work this admission is notable for a lactic acid of 8.9, K of 6.5.   She does complain of dizziness that is positional.  She thinks that Meclizine is helping.   She denies chest pain, shortness of breath, palpitations.  She does endorse joint pain and myalgias.   Past Medical History  Diagnosis Date  . HTN (hypertension)   . AF (atrial fibrillation)     On Xarelto as of 11/2011  . Diastolic heart failure     Echo 5/11: There was moderate concentric hypertrophy. Systolic function was vigorous. The best esimated ejection fraction was in the range of 65% to 70%. Wall motion was normal  . Gout   . RA (rheumatoid arthritis)   . Chronic renal insufficiency     Baseline approx: 1.7  . Glaucoma     No recent eye visits noted in EMR.   . Pulmonary  artery hypertension     Per 5/11 echo, PA peak pressure: 69mm Hg  . Stroke   . Coronary artery disease      Surgical History:  Past Surgical History  Procedure Laterality Date  . Cardiac catheterization  4/02     Minimal luminal irregularities without significant focal stenosis.  Marland Kitchen Extracapsular cataract extraction with intraocular lens  11/08    Right eye, Dr. Mitzi Davenport     Prescriptions prior to admission  Medication Sig Dispense Refill  . acetaminophen (TYLENOL) 325 MG tablet Take 325 mg by mouth every 6 (six) hours as needed. pain      . ALPRAZolam (XANAX) 0.25 MG tablet Take 1 tablet (0.25 mg total) by mouth at bedtime as needed for anxiety.  30 tablet  0  . calcium carbonate (TUMS - DOSED IN MG ELEMENTAL CALCIUM) 500 MG chewable tablet Chew 1 tablet by mouth 2 (two) times daily as needed for indigestion or heartburn.      . colchicine 0.6 MG tablet Take 0.6 mg by mouth daily as needed (for gout attacks).      Marland Kitchen diltiazem (TIAZAC) 240 MG 24 hr capsule Take 1 capsule (240 mg total) by mouth daily.  30 capsule  1  . feeding supplement (ENSURE COMPLETE) LIQD Take 237 mLs by mouth 2 (two) times daily between meals.  60 Bottle  1  . furosemide (LASIX) 40 MG tablet Take 40 mg by mouth 2 (two) times  daily as needed for fluid or edema.      . isosorbide-hydrALAZINE (BIDIL) 20-37.5 MG per tablet Take 1 tablet by mouth 2 (two) times daily.  60 tablet  1  . latanoprost (XALATAN) 0.005 % ophthalmic solution Place 1 drop into both eyes at bedtime.      . meclizine (ANTIVERT) 12.5 MG tablet Take 1 tablet (12.5 mg total) by mouth 2 (two) times daily as needed for dizziness.  30 tablet  2  . metoprolol succinate (TOPROL-XL) 50 MG 24 hr tablet Take 1 tablet (50 mg total) by mouth daily.  30 tablet  3  . pantoprazole (PROTONIX) 40 MG tablet Take 1 tablet (40 mg total) by mouth daily at 12 noon.  30 tablet  1  . potassium chloride SA (K-DUR,KLOR-CON) 20 MEQ tablet Take 1 tablet (20 mEq total) by  mouth daily.  30 tablet  3  . Rivaroxaban (XARELTO) 15 MG TABS tablet Take 1 tablet (15 mg total) by mouth daily.  30 tablet  11    Inpatient Medications:  . antiseptic oral rinse  15 mL Mouth Rinse BID  . calcium carbonate  2 tablet Oral BID  . diltiazem  240 mg Oral Daily  . feeding supplement (ENSURE COMPLETE)  237 mL Oral BID BM  . furosemide  40 mg Oral BID  . latanoprost  1 drop Both Eyes QHS  . metoprolol succinate  50 mg Oral Daily  . pantoprazole  40 mg Oral Q1200  . predniSONE  10 mg Oral Q breakfast  . Rivaroxaban  15 mg Oral Q supper  . sodium chloride  3 mL Intravenous Q12H    Allergies: No Known Allergies  History   Social History  . Marital Status: Widowed    Spouse Name: N/A    Number of Children: N/A  . Years of Education: N/A   Occupational History  . 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    Social History Main Topics  . Smoking status: Former Smoker    Types: Cigarettes    Quit date: 10/11/1971  . Smokeless tobacco: Not on file  . Alcohol Use: No  . Drug Use: No  . Sexual Activity: No   Other Topics Concern  . Not on file   Social History Narrative   Financial assistance approved for 25% discount after Medicare pays for [facility] only, not eligible for [facility] card.      Has [place] health care, medicare.  Currently lives with her daughter and her 2 granddaughters. Has 3 children in town which she is close to and is able to rely on if needed.  High school education. She normally lives alone and can ambulate with a walker. She normally walks herself to the bathroom and perform basic ADLs.     Family History  Problem Relation Age of Onset  . Diabetes Mother   . Heart disease Father 14    Currently deceased  . Stroke Sister 35  . Stroke Mother 8    Physical Exam: Filed Vitals:   09/25/13 0403 09/25/13 0500 09/25/13 0700 09/25/13 0730  BP:      Pulse:   104   Temp: 97.9 F (36.6 C)   97.7 F (36.5 C)  TempSrc: Oral   Oral  Resp:    22   Height:      Weight:  122 lb 9.2 oz (55.6 kg)    SpO2:   100% 100%    GEN-  The patient is chronically ill appearing, sleeping but rouses Head- normocephalic, atraumatic Eyes-  Sclera clear, conjunctiva pink Ears- hearing intact Oropharynx- clear Neck- supple,  Lungs- decreased BS Heart- irregular rate and rhythm  GI- soft, NT, ND, + BS Extremities- no clubbing, cyanosis, or edema MS- profound rheumatic changes Neuro- strength and sensation are intact  Labs:   Lab Results  Component Value Date   WBC 8.4 09/23/2013   HGB 7.7* 09/23/2013   HCT 25.4* 09/23/2013   MCV 89.4 09/23/2013   PLT 433* 09/23/2013    Recent Labs Lab 09/22/13 1305  09/24/13 0510  NA 138  < > 137  K 4.3  < > 3.8  CL 101  < > 98  CO2 24  < > 24  BUN 32*  < > 42*  CREATININE 1.28*  < > 1.50*  CALCIUM 8.1*  < > 8.2*  PROT 6.1  --   --   BILITOT 0.5  --   --   ALKPHOS 153*  --   --   ALT 9  --   --   AST 17  --   --   GLUCOSE 130*  < > 85  < > = values in this interval not displayed. Lab Results  Component Value Date   CKTOTAL 106 10/14/2011   CKMB 2.6 10/14/2011   TROPONINI <0.30 09/22/2013   Lab Results  Component Value Date   CHOL 121 10/11/2011   CHOL 183 05/25/2010   CHOL  Value: 163        ATP [name] CLASSIFICATION:  <200     mg/dL   Desirable  147-829  mg/dL   Borderline High  >=562    mg/dL   High        11/30/8655   Lab Results  Component Value Date   HDL 37* 10/11/2011   HDL 43 05/25/2010   HDL 37* 03/17/2010   Lab Results  Component Value Date   LDLCALC 58 10/11/2011   LDLCALC 98 05/25/2010   LDLCALC  Value: 73        Total Cholesterol/HDL:CHD Risk Coronary Heart Disease Risk Table                     Men   Women  1/2 Average Risk   3.4   3.3  Average Risk       5.0   4.4  2 X Average Risk   9.6   7.1  3 X Average Risk  23.4   11.0        Use the calculated Patient Ratio above and the CHD Risk Table to determine the patient's CHD Risk.        ATP [name] CLASSIFICATION (LDL):   <100     mg/dL   Optimal  846-962  mg/dL   Near or Above                    Optimal  130-159  mg/dL   Borderline  952-841  mg/dL   High  >324     mg/dL   Very High 01/30/271   Lab Results  Component Value Date   TRIG 130 10/11/2011   TRIG 211* 05/25/2010   TRIG 264* 03/17/2010   Lab Results  Component Value Date   CHOLHDL 3.3 10/11/2011   CHOLHDL 4.3 Ratio 05/25/2010   CHOLHDL 4.4 03/17/2010   No results found for this basename: LDLDIRECT    Lab Results  Component Value Date  DDIMER  Value: 0.28        AT THE INHOUSE ESTABLISHED CUTOFF VALUE OF 0.48 ug/mL FEU, THIS ASSAY HAS BEEN DOCUMENTED IN THE LITERATURE TO HAVE A SENSITIVITY AND NEGATIVE PREDICTIVE VALUE OF AT LEAST 98 TO 99%.  THE TEST RESULT SHOULD BE CORRELATED WITH AN ASSESSMENT OF THE CLINICAL PROBABILITY OF DVT / VTE. 04/05/2009     Radiology/Studies: Dg Chest Port 1 View 09/23/2013   CLINICAL DATA:  Short of breath, severe pulmonary hypertension ; pulmonary edema versus pneumonia  EXAM: PORTABLE CHEST - 1 VIEW  COMPARISON:  Prior chest x-ray 09/22/2013  FINDINGS: Stable cardiomegaly. Slightly increased right pleural effusion and associated dense right basilar opacity. There may be minimal pulmonary vascular congestion, but no overt edema. No pneumothorax. No acute osseous abnormality.  IMPRESSION: Slightly enlarged right pleural effusion and persistent dense right basilar opacity likely reflecting a combination of pleural fluid, atelectasis and/or infiltrate.  Minimal vascular congestion without overt edema.   Electronically Signed   By: Malachy Moan M.D.   On: 09/23/2013 10:28   Dg Chest Port 1 View 09/22/2013   CLINICAL DATA:  Pulmonary edema.  EXAM: PORTABLE CHEST - 1 VIEW  COMPARISON:  09/21/2013  FINDINGS: Mild cardiomegaly. Improving aeration in the lungs. Moderate residual right lung airspace disease and right effusion. The right effusion is also decreased since prior study. Mild left base atelectasis. No acute bony  abnormality.  IMPRESSION: Improving aeration of the lungs with decreasing bilateral airspace disease. Moderate residual right lung airspace disease and right effusion. The right effusion has also decreased.  Stable elevation of the right hemidiaphragm.  Mild cardiomegaly.   Electronically Signed   By: Charlett Nose M.D.   On: 09/22/2013 11:30   ZOX:WRUEAV tach with 2:1 conduction, ventricular rate 92  TELEMETRY: atrial tach with 2:1 conduction  Assessment and Plan: 1. Atrial tachycardia/ atrial fibrillation The patient has asympatomic atrial tachycardia and afib.  Given her CHAD2VASC elevation (at least 7) I would agree with anticoagulation long term.  I think that given her severe TR and pulmonary hypertension that we should treat with rate control long term.  I think that our ability to maintain sinus rhythm long term is very low. She is reasonably rate controlled at this time. Continue current medicine regimen.  2. Tachycardia/ bradycardia syndrome The patient has had bradycardia for which she was minimally symptomatic.  She wishes to avoid PPM.  Given her poor prognosis, she is not a candidate for EP procedures at this time  Continue current medicines I will see as needed going forward.

## 2013-09-24 NOTE — Care Management Note (Signed)
    Page 1 of 1   09/24/2013     11:31:57 AM   CARE MANAGEMENT NOTE 09/24/2013  Patient:  Kim [name], Kim [name]   Account Number:  [id number]  Date Initiated:  09/23/2013  Documentation initiated by:  Junius Creamer  Subjective/Objective Assessment:   adm w brady     Action/Plan:   lives w fam, pcp dr alex wilson   Anticipated DC Date:     Anticipated DC Plan:  HOME W HOME HEALTH SERVICES      DC Planning Services  CM consult      [facility] Choice  HOME HEALTH   Choice offered to / List presented to:  C-1 Patient        HH arranged  HH-2 PT      HH agency  [facility]   Status of service:   Medicare Important Message given?   (If response is "NO", the following Medicare IM given date fields will be blank) Date Medicare IM given:   Date Additional Medicare IM given:    Discharge Disposition:  HOME W HOME HEALTH SERVICES  Per UR Regulation:  Reviewed for med. necessity/level of care/duration of stay  If discussed at Long Length of Stay Meetings, dates discussed:    Comments:  11/25 1129a debbie Abimbola Aki rn,bsn spoke w pt. she does not have hx w hhc. states has all eq needed except needs some kind of motoroized w/c. pt is agreeable to hhpt as phy ther here rec but wants hands on therapy due to severe rheumatoid arthritis. no pref to hhc agencies as long as they take [facility] ins. ref to donna w [facility] for [facility] and asked for eval for mot w/c.  will cont to follow.

## 2013-09-24 NOTE — Consult Note (Signed)
Subjective:  Heart rate improves with metoprolol use. May have converted to sinus rhythm with 1 st degree AV block at 8:48 AM.  Objective:  Vital Signs in the last 24 hours: Temp:  [97.7 F (36.5 C)-98.2 F (36.8 C)] 98.2 F (36.8 C) (11/25 0400) Pulse Rate:  [137-144] 138 (11/24 1508) Cardiac Rhythm:  [-] Atrial flutter;Atrial fibrillation (11/25 0400) Resp:  [15-28] 17 (11/25 0600) BP: (102-129)/(48-76) 124/76 mmHg (11/25 0600) SpO2:  [97 %-100 %] 98 % (11/25 0400) Weight:  [55.1 kg (121 lb 7.6 oz)] 55.1 kg (121 lb 7.6 oz) (11/25 0500)  Physical Exam: BP Readings from Last 1 Encounters:  09/24/13 124/76     Wt Readings from Last 1 Encounters:  09/24/13 55.1 kg (121 lb 7.6 oz)    Weight change: -0.874 kg (-1 lb 14.8 oz)  HEENT: [place]/AT, Eyes-Brown, PERL, EOMI, Conjunctiva-Pale, Sclera-Non-icteric Neck: No JVD, No bruit, Trachea midline. Lungs:  Few basal crackles, Bilateral. Cardiac:  Regular rhythm, normal S1 and S2, no S3.  Abdomen:  Soft, non-tender. Extremities:  Decreasing right arm edema. No cyanosis. No clubbing. CNS: AxOx3, Cranial nerves grossly intact, moves all 4 extremities. Right handed. Skin: Warm and dry.   Intake/Output from previous day: 11/24 0701 - 11/25 0700 In: 1848.1 [P.O.:1440; I.V.:408.1] Out: 400 [Urine:400]    Lab Results: BMET    Component Value Date/Time   NA 137 09/24/2013 0510   K 3.8 09/24/2013 0510   CL 98 09/24/2013 0510   CO2 24 09/24/2013 0510   GLUCOSE 85 09/24/2013 0510   BUN 42* 09/24/2013 0510   CREATININE 1.50* 09/24/2013 0510   CREATININE 1.65* 12/16/2011 1725   CALCIUM 8.2* 09/24/2013 0510   GFRNONAA 31* 09/24/2013 0510   GFRAA 36* 09/24/2013 0510   CBC    Component Value Date/Time   WBC 8.4 09/23/2013 0710   RBC 2.84* 09/23/2013 0710   RBC 3.58* 11/04/2011 0620   HGB 7.7* 09/23/2013 0710   HCT 25.4* 09/23/2013 0710   PLT 433* 09/23/2013 0710   MCV 89.4 09/23/2013 0710   MCH 27.1 09/23/2013 0710   MCHC 30.3  09/23/2013 0710   RDW 18.4* 09/23/2013 0710   LYMPHSABS 0.6* 09/22/2013 1305   MONOABS 0.8 09/22/2013 1305   EOSABS 0.0 09/22/2013 1305   BASOSABS 0.0 09/22/2013 1305   CARDIAC ENZYMES Lab Results  Component Value Date   CKTOTAL 106 10/14/2011   CKMB 2.6 10/14/2011   TROPONINI <0.30 09/22/2013    Scheduled Meds: . antiseptic oral rinse  15 mL Mouth Rinse BID  . calcium carbonate  2 tablet Oral BID  . feeding supplement (ENSURE COMPLETE)  237 mL Oral BID BM  . furosemide  40 mg Oral BID  . latanoprost  1 drop Both Eyes QHS  . metoprolol succinate  50 mg Oral Daily  . pantoprazole  40 mg Oral Q1200  . predniSONE  15 mg Oral Q breakfast  . Rivaroxaban  15 mg Oral Q supper  . sodium chloride  3 mL Intravenous Q12H   Continuous Infusions: . sodium chloride 10 mL/hr at 09/23/13 1515  . diltiazem (CARDIZEM) infusion 20 mg/hr (09/24/13 0209)   PRN Meds:.acetaminophen, ALPRAZolam, atropine, HYDROcodone-acetaminophen, meclizine, metoprolol  Assessment/Plan: Atrial fibrillation with intermittent high grade AV block  Chronic Left heart systolic and diastolic heart failure  Hyperkalemia-resolved  Chronic anemia, blood loss and chronic disease  Chronic pulmonary fibrosis  Chronic pulmonary hypertension  Rheumatoid arthritis and gout  Right upper extremity swelling-improving  Decrease lasix to one daily for  mild increase in BUN/Cr and low blood pressure. Resume PO metoprolol from IV metoprolol. OK to resume diltiazem as 240 mg. PO.    LOS: 3 days    Kim Cobb  MD  09/24/2013, 8:51 AM

## 2013-09-24 NOTE — Progress Notes (Signed)
Subjective: Kim [name] is doing better this morning.  States the her pain is still gone due to steroid therapy.  She reports some dizziness after being turned during her bath.  No shortness of breath, cough or sputum production.  She has converted to sinus rhythm, rate 80s-90s.    Objective: Vital signs in last 24 hours: Filed Vitals:   09/24/13 0000 09/24/13 0400 09/24/13 0500 09/24/13 0600  BP: 129/65 120/68  124/76  Pulse:      Temp: 98 F (36.7 C) 98.2 F (36.8 C)    TempSrc: Oral Oral    Resp: 19 15  17   Height:      Weight:   121 lb 7.6 oz (55.1 kg)   SpO2: 97% 98%     Weight change: -1 lb 14.8 oz (-0.874 kg)  Intake/Output Summary (Last 24 hours) at 09/24/13 0714 Last data filed at 09/24/13 0600  Gross per 24 hour  Intake 1848.08 ml  Output    400 ml  Net 1448.08 ml   PEX General: alert, cooperative, and in no apparent distress HEENT: NCAT Neck: supple, no lymphadenopathy Lungs: clear to ascultation except for rales in right lung base, normal work of respiration, no wheezes, ronchi Heart: regular rate and rhythm, no murmurs, gallops, or rubs Abdomen: soft, non-tender, non-distended, normal bowel sounds Extremities: right arm and hand extremely swollen but no erythema or drainage, right knee TTP but no erythema; 2+ DP/PT pulses bilaterally, no cyanosis, clubbing, or edema Neurologic: alert & oriented X3, cranial nerves II-XII intact, strength grossly intact, sensation intact to light touch  Lab Results: Basic Metabolic Panel:  Recent Labs Lab 09/22/13 1305 09/23/13 0710 09/24/13 0510  NA 138 136 137  K 4.3 4.2 3.8  CL 101 98 98  CO2 24 23 24   GLUCOSE 130* 130* 85  BUN 32* 41* 42*  CREATININE 1.28* 1.46* 1.50*  CALCIUM 8.1* 8.5 8.2*  MG 1.9 1.9  --   PHOS 4.2  --  3.7   CBC:  Recent Labs Lab 09/21/13 1150 09/22/13 1305 09/22/13 2100 09/23/13 0710  WBC 9.9 9.2 9.0 8.4  NEUTROABS 7.8* 7.8*  --   --   HGB 8.5* 7.0* 6.9* 7.7*  HCT 27.4*  21.7* 22.5* 25.4*  MCV 89.3 86.5 88.6 89.4  PLT 425* 370 379 433*   Cardiac Enzymes:  Recent Labs Lab 09/21/13 2200 09/22/13 1310 09/22/13 2100  TROPONINI <0.30 <0.30 <0.30   BNP:  Recent Labs Lab 09/21/13 1653  PROBNP 1389.0*   Urine Drug Screen: Drugs of Abuse     Component Value Date/Time   LABOPIA NONE DETECTED 01/26/2010 1130   COCAINSCRNUR NONE DETECTED 01/26/2010 1130   LABBENZ POSITIVE* 01/26/2010 1130   AMPHETMU NONE DETECTED 01/26/2010 1130   THCU NONE DETECTED 01/26/2010 1130   LABBARB  Value: NONE DETECTED        DRUG SCREEN FOR MEDICAL PURPOSES ONLY.  IF CONFIRMATION IS NEEDED FOR ANY PURPOSE, NOTIFY LAB WITHIN 5 DAYS.        LOWEST DETECTABLE LIMITS FOR URINE DRUG SCREEN Drug Class       Cutoff (ng/mL) Amphetamine      1000 Barbiturate      200 Benzodiazepine   200 Tricyclics       300 Opiates          300 Cocaine          300 THC              50 01/26/2010 1130  Urinalysis:  Recent Labs Lab 09/21/13 2055  COLORURINE AMBER*  LABSPEC 1.030  PHURINE 5.0  GLUCOSEU NEGATIVE  HGBUR MODERATE*  BILIRUBINUR SMALL*  KETONESUR NEGATIVE  PROTEINUR 30*  UROBILINOGEN 1.0  NITRITE NEGATIVE  LEUKOCYTESUR MODERATE*    Micro Results: Recent Results (from the past 240 hour(s))  CULTURE, BLOOD (ROUTINE X 2)     Status: None   Collection Time    09/21/13  5:45 PM      Result Value Range Status   Specimen Description BLOOD RIGHT FOOT   Final   Special Requests BOTTLES DRAWN AEROBIC AND ANAEROBIC 10CC   Final   Culture  Setup Time     Final   Value: 09/22/2013 00:47     Performed at [facility]   Culture     Final   Value:        BLOOD CULTURE RECEIVED NO GROWTH TO DATE CULTURE WILL BE HELD FOR 5 DAYS BEFORE ISSUING A FINAL NEGATIVE REPORT     Performed at [facility]   Report Status PENDING   Incomplete  CULTURE, BLOOD (ROUTINE X 2)     Status: None   Collection Time    09/21/13  5:55 PM      Result Value Range Status   Specimen Description  BLOOD LEFT ARM   Final   Special Requests BOTTLES DRAWN AEROBIC ONLY 10CC   Final   Culture  Setup Time     Final   Value: 09/22/2013 00:47     Performed at [facility]   Culture     Final   Value:        BLOOD CULTURE RECEIVED NO GROWTH TO DATE CULTURE WILL BE HELD FOR 5 DAYS BEFORE ISSUING A FINAL NEGATIVE REPORT     Performed at [facility]   Report Status PENDING   Incomplete  MRSA PCR SCREENING     Status: None   Collection Time    09/21/13  8:33 PM      Result Value Range Status   MRSA by PCR NEGATIVE  NEGATIVE Final   Comment:            The GeneXpert MRSA Assay (FDA     approved for NASAL specimens     only), is one component of a     comprehensive MRSA colonization     surveillance program. It is not     intended to diagnose MRSA     infection nor to guide or     monitor treatment for     MRSA infections.  URINE CULTURE     Status: None   Collection Time    09/21/13  8:55 PM      Result Value Range Status   Specimen Description URINE, CLEAN CATCH   Final   Special Requests NONE   Final   Culture  Setup Time     Final   Value: 09/22/2013 02:05     Performed at [facility] Count     Final   Value: 50,000 COLONIES/ML     Performed at [facility]   Culture     Final   Value: Multiple bacterial morphotypes present, none predominant. Suggest appropriate recollection if clinically indicated.     Performed at [facility]   Report Status 09/23/2013 FINAL   Final   Studies/Results: Dg Chest Port 1 View  09/23/2013   CLINICAL DATA:  Short of breath, severe pulmonary  hypertension ; pulmonary edema versus pneumonia  EXAM: PORTABLE CHEST - 1 VIEW  COMPARISON:  Prior chest x-ray 09/22/2013  FINDINGS: Stable cardiomegaly. Slightly increased right pleural effusion and associated dense right basilar opacity. There may be minimal pulmonary vascular congestion, but no overt edema. No pneumothorax. No acute osseous abnormality.   IMPRESSION: Slightly enlarged right pleural effusion and persistent dense right basilar opacity likely reflecting a combination of pleural fluid, atelectasis and/or infiltrate.  Minimal vascular congestion without overt edema.   Electronically Signed   By: Malachy Moan M.D.   On: 09/23/2013 10:28   Dg Chest Port 1 View  09/22/2013   CLINICAL DATA:  Pulmonary edema.  EXAM: PORTABLE CHEST - 1 VIEW  COMPARISON:  09/21/2013  FINDINGS: Mild cardiomegaly. Improving aeration in the lungs. Moderate residual right lung airspace disease and right effusion. The right effusion is also decreased since prior study. Mild left base atelectasis. No acute bony abnormality.  IMPRESSION: Improving aeration of the lungs with decreasing bilateral airspace disease. Moderate residual right lung airspace disease and right effusion. The right effusion has also decreased.  Stable elevation of the right hemidiaphragm.  Mild cardiomegaly.   Electronically Signed   By: Charlett Nose M.D.   On: 09/22/2013 11:30   Medications: I have reviewed the patient's current medications. Scheduled Meds: . antiseptic oral rinse  15 mL Mouth Rinse BID  . calcium carbonate  2 tablet Oral BID  . feeding supplement (ENSURE COMPLETE)  237 mL Oral BID BM  . furosemide  40 mg Oral BID  . latanoprost  1 drop Both Eyes QHS  . pantoprazole  40 mg Oral Q1200  . predniSONE  15 mg Oral Q breakfast  . Rivaroxaban  15 mg Oral Q supper  . sodium chloride  3 mL Intravenous Q12H   . sodium chloride 10 mL/hr at 09/23/13 1515  . diltiazem (CARDIZEM) infusion 20 mg/hr (09/24/13 0209)   PRN Meds:.acetaminophen, ALPRAZolam, atropine, HYDROcodone-acetaminophen, meclizine, metoprolol Assessment/Plan: #Atrial fibrillation with RVR, resolved- now in sinus rhythm with rate 80s-90s. Patient has history of atrial fibrillation, on Xarelto at home.  Of note, patient became bradycardic to 20s, symptomatic in ED, gave atropine and calcium gluconate then HR went back  into 50s. Troponins x 3 negative.  - cardiology consult (outpatient cardiologist Dr. Algie Coffer) - since patient has converted to sinus rhythm, will give Toprol-XL 50 mg PO this AM then if she is still in sinus rhythm this afternoon, will give diltiazem XR 240 mg PO and discontinue diltiazem gtt ~1 hour later; if she goes back into a fib, will restart dilt gtt; if she remains in sinus rhythm overnight, will try to discharge in AM on regimen of Toprol-XL 50 mg daily, diltiazem-XR 240 mg daily - continue Xarelto - continue to monitor - PT recommending home health PT  #dCHF- portable CXR on admission showed severe pulmonary edema with moderate right pleural effusion and consolidation in right lung base. Repeat CXR 11/23 showed improving aeration of lungs with decreasing bilateral airspace disease; right effusion also decreased in size. CURB65 score of 2 (6.8% 30 day mortality).  Additional repeat CXR 11/24 showed minimal vascular congestion without overt edema. Afebrile, no leucocytosis, does not meet SIRS criteria. Lactic acid 8.91 --> 3.8 --> 3.3 --> 2.3. Satting 100% on RA though patient states she wears oxygen at home.  Recently admitted in 05/2013 for acute on chronic dCHF. Echo at that time showed EF 60-65%, normal systolic function, wall motion normal, grade 1 diastolic dysfunction, severe  pulmonary hypertension. pBNP 1389 (3679 in 05/2013), no evidence of volume overload on exam (no crackles, no LE edema).  Patient has poor prognosis.  - continue home Lasix 40 mg PO BID - hold off on IVFs given pulmonary edema    #AKI- Cr up to 1.5 today (0.97 on admission, 1.46 yesterday).  - avoiding IVFs for now given above - BMP in AM  #Anion gap metabolic acidosis, resolving- AG 15, likely due to lactic acidosis given lactic acid 8.9, now resolving (3.3). ABG attempted twice but unsuccessful.   -blood cultures x 2 NGTD, pending  #RA flare- Patient having increased pain in right arm and hand, right leg from  baseline. She was diagnosed with RA >20 years ago, patient takes Tylenol at home for pain. ESR elevated at 82 on presentation. Right arm and hand swollen, but patient has history of CVA and lies on right side 24/7. Patient also has OA and gout but less likely given above. Right knee xray showed severe degenerative change with findings similar to prior study though advanced in severity, consistent with RA. RUE Doppler negative.  Acetaminophen, salicylate levels within normal limits.   -prednisone 10 mg daily for now, will likely d/c on this dose -Norco 5-325 q8h prn pain   #Hyperkalemia, resolved- 3.8 today.  Patient received kayexelate 15 g in ED. Repeat BMP showed K 6.5 thus patient given another kayexelate 15 g.  -repeat BMP in AM   Dispo: Disposition is deferred at this time, awaiting improvement of current medical problems.  Anticipated discharge in approximately 1-2 day(s).   The patient does have a current PCP Evelena Peat, DO) and does need an [facility] hospital follow-up appointment after discharge.   .Services Needed at time of discharge: Y = Yes, Blank = No PT:   OT:   RN:   Equipment:   Other:     LOS: 3 days   Rocco Serene, MD 09/24/2013, 7:14 AM

## 2013-09-24 NOTE — Evaluation (Addendum)
Physical Therapy Evaluation Patient Details Name: Kim [name] MRN: [id number] DOB: [birth year] Today's Date: 09/24/2013 Time: 0900-0919 PT Time Calculation (min): 19 min  PT Assessment / Plan / Recommendation History of Present Illness  Kim [name] is an 79 year old woman with a history of paroxysmal atrial fibrillation, grade 1 diastolic heart failure, rheumatoid arthritis, and pulmonary hypertension who presents with a several day history of worsening pain throughout her body. She has over a 20 year history of rheumatoid arthritis but apparently has not been treated with any disease modifying agents. She has had periodic prednisone burst with taper with improvement of her symptoms. Generally, she is satisfied with taking Tylenol as needed. As of late, the Tylenol has not given her any relief and she presents to the emergency department complaining of right hand and arm pain that is worse than usual. She also notes worsening in her right hand swelling and left hand swelling. She denies any palpitations or chest pain. She had mild shortness of breath but when seen this morning on rounds she was breathing comfortably lying on her right side without any dyspnea. [name]  Clinical Impression  Pt with generalized weakness, ROM limitations and decreased function at baseline. Pt reports she was able to get to [facility] at home and has been taking a few steps until 2wks ago. Pt unable to tolerate any OOB mobility today due to fatigue and dizziness and reports never being treated by therapy for vertigo, no nystagmus noted. Pt will benefit from acute therapy to maximize mobility and return pt to PLOF of transfers with family assist. Pt incontinent of stool on arrival and assist with RN for pericare.     PT Assessment  Patient needs continued PT services    Follow Up Recommendations  Supervision/Assistance - 24 hour;Home health PT    Does the patient have the potential to tolerate intense  rehabilitation      Barriers to Discharge        Equipment Recommendations       Recommendations for Other Services     Frequency Min 2X/week    Precautions / Restrictions Precautions Precautions: Fall Precaution Comments: contracted right knee, RA deformity of hands   Pertinent Vitals/Pain HR 91 Pt reports dizziness and pain in RUE, unrated     Mobility  Bed Mobility Bed Mobility: Rolling Right;Rolling Left;Scooting to HOB Rolling Right: 3: Mod assist Rolling Left: 3: Mod assist Scooting to HOB: 1: +2 Total assist Scooting to [facility]: Patient Percentage: 0% Details for Bed Mobility Assistance: pt rolled bilaterally for pericare and able to partially bridge for bedpan for urine. Pt denied attempting to sit EOB or get OOB. Pt reports dizziness and spinning with Left sidelying as an ongoing problem, states she takes something for it but has never been treated for vertigo, no nystagmus noted. Cueing throughout for sequence with assist at shoulder to rotate trunk toward left. Transfers Transfers: Not assessed, pt refused Ambulation/Gait Ambulation/Gait Assistance: Not tested (comment)    Exercises     PT Diagnosis: Generalized weakness;Acute pain  PT Problem List: Decreased strength;Decreased range of motion;Decreased activity tolerance;Decreased mobility;Pain;Decreased cognition PT Treatment Interventions: Functional mobility training;Therapeutic activities;Therapeutic exercise;Cognitive remediation;Patient/family education     PT Goals(Current goals can be found in the care plan section) Acute Rehab PT Goals Patient Stated Goal: return home with family PT Goal Formulation: With patient Time For Goal Achievement: 10/08/13 Potential to Achieve Goals: Fair  Visit Information  Last PT Received On: 09/24/13 Assistance Needed: +2 (+  1 bed, +2 OOB) History of Present Illness: Kim [name] is an 79 year old woman with a history of paroxysmal atrial fibrillation, grade 1 diastolic  heart failure, rheumatoid arthritis, and pulmonary hypertension who presents with a several day history of worsening pain throughout her body. She has over a 20 year history of rheumatoid arthritis but apparently has not been treated with any disease modifying agents. She has had periodic prednisone burst with taper with improvement of her symptoms. Generally, she is satisfied with taking Tylenol as needed. As of late, the Tylenol has not given her any relief and she presents to the emergency department complaining of right hand and arm pain that is worse than usual. She also notes worsening in her right hand swelling and left hand swelling. She denies any palpitations or chest pain. She had mild shortness of breath but when seen this morning on rounds she was breathing comfortably lying on her right side without any dyspnea. [name]       Prior Functioning  Home Living Family/patient expects to be discharged to:: Private residence Living Arrangements: Children Available Help at Discharge: Family;Available 24 hours/day Type of Home: House Home Access: Ramped entrance Home Layout: One level Home Equipment: Walker - 4 wheels;Bedside commode;Wheelchair - manual;Shower seat;Cane - quad;Cane - single point;Grab bars - tub/shower Prior Function Level of Independence: Needs assistance Gait / Transfers Assistance Needed: pt has not ambulated more than a few feet since April,  2014. Son assist for pivot to [facility] ADL's / Homemaking Assistance Needed: assist for all adls, pt receives sponge baths Comments: since I slid off the bed I've been afriad of getting up Communication Communication: No difficulties Dominant Hand: Right    Cognition  Cognition Arousal/Alertness: Awake/alert Behavior During Therapy: Flat affect Overall Cognitive Status: Impaired/Different from baseline Area of Impairment: Safety/judgement;Memory;Problem solving Memory: Decreased short-term memory Safety/Judgement:  Decreased awareness of deficits Problem Solving: Slow processing    Extremity/Trunk Assessment Upper Extremity Assessment Upper Extremity Assessment: Generalized weakness (bil hand deformity from RA, limited shoulder ROM RUE) Lower Extremity Assessment Lower Extremity Assessment: RLE deficits/detail;LLE deficits/detail RLE Deficits / Details: knee flexion contracture grossly 10degrees, AROM 2-5 LLE Deficits / Details: full knee extension, but prefers flexion 2-/5   Balance    End of Session PT - End of Session Activity Tolerance: Patient limited by fatigue Patient left: in bed;with call bell/phone within reach Nurse Communication: Mobility status  GP     [name] 09/24/2013, 10:10 AM [name], PT [phone]

## 2013-09-24 NOTE — Progress Notes (Signed)
  Date: 09/24/2013  Patient name: MENUCHA DICESARE  Medical record number: [id number]  Date of birth: 1929-02-27   This patient has been seen and the plan of care was discussed with the house staff. Please see their note for complete details. I concur with their findings with the following additions/corrections: She is currently in NSR. She denies CP or SOB. Ok to wean off dilt gtt and start diltiazem 240 mg po this afternoon. Can give dose of metoprolol this morning 50 mg po.  Continue to monitor Cr, I don't think she is overdiuresed. Hopefully with NSR this will improve, suspect some cardiorenal physiology.  Jonah Blue, DO, FACP Faculty [facility] 09/24/2013, 11:27 AM

## 2013-09-25 LAB — BASIC METABOLIC PANEL
BUN: 39 mg/dL — ABNORMAL HIGH (ref 6–23)
CO2: 23 mEq/L (ref 19–32)
Calcium: 8.3 mg/dL — ABNORMAL LOW (ref 8.4–10.5)
Chloride: 98 mEq/L (ref 96–112)
GFR calc Af Amer: 36 mL/min — ABNORMAL LOW (ref >90)
Glucose, Bld: 103 mg/dL — ABNORMAL HIGH (ref 70–99)
Potassium: 3.8 mEq/L (ref 3.5–5.1)
Sodium: 138 mEq/L (ref 135–145)

## 2013-09-25 MED ORDER — PREDNISONE 10 MG PO TABS: 10.0000 mg | ORAL_TABLET | Freq: Every day | ORAL | Status: DC

## 2013-09-25 NOTE — Progress Notes (Signed)
Clinical Social Work Department BRIEF PSYCHOSOCIAL ASSESSMENT 09/25/2013  Patient:  Kim [name], Kim [name]     Account Number:  [id number]     Admit date:  09/21/2013  Clinical Social Worker:  Varney Biles  Date/Time:  09/25/2013 12:04 PM  Referred by:  RN  Date Referred:  09/25/2013 Referred for  Transportation assistance   Other Referral:   Interview type:  Patient Other interview type:    PSYCHOSOCIAL DATA Living Status:  FAMILY Admitted from facility:   Level of care:   Primary support name:  Aften Lipsey [phone]) Primary support relationship to patient:  CHILD, ADULT Degree of support available:   Good--pt lives at home with children, per nursing admin summary.    CURRENT CONCERNS Current Concerns  Other - See comment   Other Concerns:   Transportation via non-emergent ambulance.    SOCIAL WORK ASSESSMENT / PLAN CSW introduced self and explained that CSW got a call from RN stating pt requesting non-emergency ambulance home. Pt confirmed that this is how she normally goes to and from the hospital. CSW asked RN if RN knows when pt is discharging, and RN states notes are not in for discharge so she is unsure. CSW left phone number for PTAR, CSW phone number, and pt's home address that she is going to by transport. CSW also provided PTAR transportation form to RN to provide PTAR when they arrive for pickup.   Assessment/plan status:  No Further Intervention Required Other assessment/ plan:   Information/referral to community resources:   PTAR    PATIENT'S/FAMILY'S RESPONSE TO PLAN OF CARE: Pt receptive to CSW assistance. CSW signing off as transportation needs have been addressed.       Maryclare Labrador, MSW, [facility] Clinical Social Worker [phone]

## 2013-09-25 NOTE — Consult Note (Signed)
Subjective:  Some shortness of breath and dizziness. Heart rate 90-110/min. Atrial tachycardia. Afebrile.  Objective:  Vital Signs in the last 24 hours: Temp:  [97.2 F (36.2 C)-97.9 F (36.6 C)] 97.7 F (36.5 C) (11/26 0730) Pulse Rate:  [63-106] 104 (11/26 0700) Cardiac Rhythm:  [-] Normal sinus rhythm (11/25 1900) Resp:  [14-23] 22 (11/26 0700) BP: (104-146)/(46-72) 146/72 mmHg (11/26 0400) SpO2:  [96 %-100 %] 100 % (11/26 0730) Weight:  [55.6 kg (122 lb 9.2 oz)] 55.6 kg (122 lb 9.2 oz) (11/26 0500)  Physical Exam: BP Readings from Last 1 Encounters:  09/25/13 146/72     Wt Readings from Last 1 Encounters:  09/25/13 55.6 kg (122 lb 9.2 oz)    Weight change: 0.5 kg (1 lb 1.6 oz)  HEENT: [place]/AT, Eyes-Brown, PERL, EOMI, Conjunctiva-Pale, Sclera-Non-icteric Neck: No JVD, No bruit, Trachea midline. Lungs:  Crackles, Bilateral. Cardiac:  Regular rhythm, normal S1 and S2, no S3. II/VI systolic murmur. Abdomen:  Soft, non-tender.   Extremities:  Decreased right arm edema now persist. Arthritic deformities of both hands as before. No cyanosis. No clubbing.  CNS: AxOx3, Cranial nerves grossly intact, moves all 4 extremities. Right handed. Skin: Warm and dry.   Intake/Output from previous day: 11/25 0701 - 11/26 0700 In: 1693 [P.O.:1440; I.V.:253] Out: 1021 [Urine:1020; Stool:1]    Lab Results: BMET    Component Value Date/Time   NA 137 09/24/2013 0510   K 3.8 09/24/2013 0510   CL 98 09/24/2013 0510   CO2 24 09/24/2013 0510   GLUCOSE 85 09/24/2013 0510   BUN 42* 09/24/2013 0510   CREATININE 1.50* 09/24/2013 0510   CREATININE 1.65* 12/16/2011 1725   CALCIUM 8.2* 09/24/2013 0510   GFRNONAA 31* 09/24/2013 0510   GFRAA 36* 09/24/2013 0510   CBC    Component Value Date/Time   WBC 8.4 09/23/2013 0710   RBC 2.84* 09/23/2013 0710   RBC 3.58* 11/04/2011 0620   HGB 7.7* 09/23/2013 0710   HCT 25.4* 09/23/2013 0710   PLT 433* 09/23/2013 0710   MCV 89.4 09/23/2013 0710   MCH  27.1 09/23/2013 0710   MCHC 30.3 09/23/2013 0710   RDW 18.4* 09/23/2013 0710   LYMPHSABS 0.6* 09/22/2013 1305   MONOABS 0.8 09/22/2013 1305   EOSABS 0.0 09/22/2013 1305   BASOSABS 0.0 09/22/2013 1305   CARDIAC ENZYMES Lab Results  Component Value Date   CKTOTAL 106 10/14/2011   CKMB 2.6 10/14/2011   TROPONINI <0.30 09/22/2013    Scheduled Meds: . antiseptic oral rinse  15 mL Mouth Rinse BID  . calcium carbonate  2 tablet Oral BID  . diltiazem  240 mg Oral Daily  . feeding supplement (ENSURE COMPLETE)  237 mL Oral BID BM  . furosemide  40 mg Oral BID  . latanoprost  1 drop Both Eyes QHS  . metoprolol succinate  50 mg Oral Daily  . pantoprazole  40 mg Oral Q1200  . predniSONE  10 mg Oral Q breakfast  . Rivaroxaban  15 mg Oral Q supper  . sodium chloride  3 mL Intravenous Q12H   Continuous Infusions: . sodium chloride Stopped (09/24/13 1600)  . diltiazem (CARDIZEM) infusion Stopped (09/24/13 1600)   PRN Meds:.acetaminophen, ALPRAZolam, atropine, HYDROcodone-acetaminophen, meclizine  Assessment/Plan: Atrial fibrillation with intermittent high grade AV block  Atrial tachycardia Chronic Left heart systolic and diastolic failure  Hyperkalemia-resolved  Chronic anemia, blood loss and chronic disease  Chronic pulmonary fibrosis  Chronic pulmonary hypertension  Rheumatoid arthritis and gout  Right upper  extremity swelling-improving  Continue medical treatment. Appreciate EP consult. Patient against pacemaker placement. Prognosis remains poor without additional positive inotropic treatment   LOS: 4 days    Orpah Cobb  MD  09/25/2013, 8:14 AM

## 2013-09-25 NOTE — Discharge Summary (Signed)
Name: Kim [name] MRN: [id number] DOB: 1928/12/04 79 y.o. PCP: Evelena Peat, DO  Date of Admission: 09/21/2013 10:01 AM Date of Discharge: 09/25/2013 Attending Physician: Jonah Blue, DO  Discharge Diagnosis: Principal Problem:   Atrial fibrillation with RVR Active Problems:   Chronic diastolic heart failure   Impaired mobility and ADLs   Symptomatic bradycardia   Edema-right upper ext    OA (osteoarthritis)   Rheumatoid arthritis   Pulmonary edema   Pleural effusion, right   Elevated lactic acid level   Increased anion gap metabolic acidosis   Hypotension   Pulmonary hypertension   Normocytic anemia   Hyperkalemia   Acute kidney injury  Discharge Medications:   Medication List         acetaminophen 325 MG tablet  Commonly known as:  TYLENOL  Take 325 mg by mouth every 6 (six) hours as needed. pain     ALPRAZolam 0.25 MG tablet  Commonly known as:  XANAX  Take 1 tablet (0.25 mg total) by mouth at bedtime as needed for anxiety.     calcium carbonate 500 MG chewable tablet  Commonly known as:  TUMS - dosed in mg elemental calcium  Chew 1 tablet by mouth 2 (two) times daily as needed for indigestion or heartburn.     colchicine 0.6 MG tablet  Take 0.6 mg by mouth daily as needed (for gout attacks).     diltiazem 240 MG 24 hr capsule  Commonly known as:  TIAZAC  Take 1 capsule (240 mg total) by mouth daily.     feeding supplement (ENSURE COMPLETE) Liqd  Take 237 mLs by mouth 2 (two) times daily between meals.     furosemide 40 MG tablet  Commonly known as:  LASIX  Take 40 mg by mouth 2 (two) times daily as needed for fluid or edema.     isosorbide-hydrALAZINE 20-37.5 MG per tablet  Commonly known as:  BIDIL  Take 1 tablet by mouth 2 (two) times daily.     latanoprost 0.005 % ophthalmic solution  Commonly known as:  XALATAN  Place 1 drop into both eyes at bedtime.     meclizine 12.5 MG tablet  Commonly known as:  ANTIVERT  Take 1 tablet  (12.5 mg total) by mouth 2 (two) times daily as needed for dizziness.     metoprolol succinate 50 MG 24 hr tablet  Commonly known as:  TOPROL-XL  Take 1 tablet (50 mg total) by mouth daily.     pantoprazole 40 MG tablet  Commonly known as:  PROTONIX  Take 1 tablet (40 mg total) by mouth daily at 12 noon.     potassium chloride SA 20 MEQ tablet  Commonly known as:  K-DUR,KLOR-CON  Take 1 tablet (20 mEq total) by mouth daily.     predniSONE 10 MG tablet  Commonly known as:  DELTASONE  Take 1 tablet (10 mg total) by mouth daily with breakfast.     Rivaroxaban 15 MG Tabs tablet  Commonly known as:  XARELTO  Take 1 tablet (15 mg total) by mouth daily.        Disposition and follow-up:   [name] was discharged from [facility] in Stable condition.  At the hospital follow up visit please address:  1.  RA pain status; decide on pain regimen long term- she was pain-free at discharge on prednisone 10 mg daily, states that only steroids control her pain thus she was discharged with prednisone 10 mg  daily #10 (please call Deltasone to patient as she believes prednisone does not work for her) especially given her poor prognosis.   2.  Labs / imaging needed at time of follow-up: none  3.  Pending labs/ test needing follow-up: none  Follow-up Appointments: Follow-up Information   Follow up with Otis Brace, MD On 10/02/2013. (8:30a)    Specialty:  Internal Medicine   Contact information:   [address] [place] [address] [phone]       Discharge Instructions: Discharge Orders   Future Appointments Provider Department Dept Phone   10/02/2013 8:30 AM Otis Brace, MD [facility] [phone]   Future Orders Complete By Expires   Call MD for:  difficulty breathing, headache or visual disturbances  As directed    Call MD for:  severe uncontrolled pain  As directed    Diet - low sodium heart healthy  As directed     Increase activity slowly  As directed       Consultations: cardiology (outpaitnet cardiologist Dr. Algie Coffer)  Procedures Performed:  Dg Chest Port 1 View  09/23/2013   CLINICAL DATA:  Short of breath, severe pulmonary hypertension ; pulmonary edema versus pneumonia  EXAM: PORTABLE CHEST - 1 VIEW  COMPARISON:  Prior chest x-ray 09/22/2013  FINDINGS: Stable cardiomegaly. Slightly increased right pleural effusion and associated dense right basilar opacity. There may be minimal pulmonary vascular congestion, but no overt edema. No pneumothorax. No acute osseous abnormality.  IMPRESSION: Slightly enlarged right pleural effusion and persistent dense right basilar opacity likely reflecting a combination of pleural fluid, atelectasis and/or infiltrate.  Minimal vascular congestion without overt edema.   Electronically Signed   By: Malachy Moan M.D.   On: 09/23/2013 10:28   Dg Chest Port 1 View  09/22/2013   CLINICAL DATA:  Pulmonary edema.  EXAM: PORTABLE CHEST - 1 VIEW  COMPARISON:  09/21/2013  FINDINGS: Mild cardiomegaly. Improving aeration in the lungs. Moderate residual right lung airspace disease and right effusion. The right effusion is also decreased since prior study. Mild left base atelectasis. No acute bony abnormality.  IMPRESSION: Improving aeration of the lungs with decreasing bilateral airspace disease. Moderate residual right lung airspace disease and right effusion. The right effusion has also decreased.  Stable elevation of the right hemidiaphragm.  Mild cardiomegaly.   Electronically Signed   By: Charlett Nose M.D.   On: 09/22/2013 11:30   Dg Chest Port 1 View  09/21/2013   CLINICAL DATA:  Chest pain  EXAM: PORTABLE CHEST - 1 VIEW  COMPARISON:  July 01, 2013  FINDINGS: There is severe pulmonary edema. There is a moderate right pleural effusion. Consolidation of the right lung base is identified. Multiple overlying artifacts are noted. Mediastinal contour and cardiac silhouette  are stable.  IMPRESSION: Severe pulmonary edema.  Moderate right pleural effusion.   Electronically Signed   By: Sherian Rein M.D.   On: 09/21/2013 17:45   Dg Knee Right Port  09/21/2013   CLINICAL DATA:  Pain  EXAM: PORTABLE RIGHT KNEE - 1-2 VIEW  COMPARISON:  11/13/2006  FINDINGS: There is severe loss of joint space and there is cystic subchondral degenerative change. There is a joint effusion. There are well circumscribed erosive changes seen in the anterior condyles as well as along the medial femoral condyle.  IMPRESSION: Severe degenerative change, with findings similar to prior study although advanced in severity. As previously indicated, these findings would be consistent with the clinical diagnosis of rheumatoid  arthritis.   Electronically Signed   By: Esperanza Heir M.D.   On: 09/21/2013 17:50    Admission HPI:  Ms. Rippetoe is an 79 year old woman with history of atrial fibrillation (on Xarelto), dCHF (grade 1 diastolic dysfunction), pulmonary hypertension, rheumatoid arthritis, osteoarthritis, gout who presents with right arm and hand pain as well as right leg pain. Patient is poor historian.  Patient states that she has had rheumatoid arthritis for 20 years and was not bothered by it until relatively recently. She states that her right thumb has been swollen for months and her right hand is usually swollen as well though has become more so recently. Her right arm and hand are more painful than usual. She also states that today her legs started hurting R>L and she could not really stretch her legs out in bed due to pain. She only takes Tylenol for RA pain. She came into ED today due to unbearable pain "all over."  Denies fever, chest pain, shortness of breath, abdominal pain, nausea/vomiting, change in bowel or bladder habits, dizziness.  She lives at home with her son and "has a little bit of everything" in terms of equipment- wheelchair, shower chair, shower bars, etc. She no longer  ambulates much.    Hospital Course by problem list: 1. Atrial fibrillation with RVR, resolved- Patient was in atrial fibrillation throughout most of admission and intermittently in RVR.  She was placed on diltiazem gtt and given Toprol-XL 50 mg PO and converted to sinus rhythm day prior to discharge; she was subsequently transitioned to home regimen of PO diltiazem and metoprolol and while she did convert back to atrial fibrillation morning of admission, she was rate controlled in 100s (goal <10).  She also takes Xarelto at home which was continued while inpatient and at discharge.  Of note, patient became bradycardic to 20s (symptomatic) in ED day of admission and received atropine and calcium gluconate before HR went back into 50s.  Troponins x 3 negative.   Patient was discharged in atrial fibrillation with rate in 100s (goal <110).  She was instructed to resume her home medication regimen of diltiazem-XR 240 mg daily, Toprol-XL 50 mg daily, and Xarelto.  Dr. Algie Coffer (patient's outpatient cardiologist) followed throughout admission and made recommendations; he consulted EP who stated that patient would be candidate for pacemaker but she declined this intervention.  PT also recommended home health PT which was arranged prior to discharge with the assistance of case management.  Patient has very poor long term prognosis.   2. dCHF- Portable CXR on admission showed severe pulmonary edema with moderate right pleural effusion and consolidation in right lung base. Repeat CXR 11/23 showed improving aeration of lungs with decreasing bilateral airspace disease; right effusion also decreased in size.  Additional repeat CXR 11/24 showed minimal vascular congestion without overt edema.  She was recently admitted in 05/2013 for acute on chronic dCHF.  Echo at that time showed EF 60-65%, normal systolic function, wall motion normal, grade 1 diastolic dysfunction, severe pulmonary hypertension. pBNP 1389 (3679 in 05/2013),  no evidence of volume overload on exam (no crackles, no LE edema).  Patient was satting 100% on RA though she wears oxygen at home.  No evidence of volume overload on exam so we continued home Lasix 40 mg PO BID while inpatient.  Creatinine did increase some over course of admission, (likely cardiorenal component) but did not given IVFs after day of admission given pulmonary edema.    3. AKI- Cr  stable at 1.5 (and at baseline) on day of discharge. Did not give IVFs after day of admission given above.    4. Anion gap metabolic acidosis, resolving- AG 17 day of discharge, was likely due to lactic acidosis given lactic acid 8.9 on admission --> 3.8 --> 3.3 --> 2.3.  ABG attempted twice but unsuccessful.  Blood cultures x 2 were negative.   5. RA flare- Patient was having increased pain in right arm and hand, right leg from baseline. She was diagnosed with RA >20 years ago, patient takes Tylenol at home for pain. ESR elevated at 82 on presentation.  Right arm and hand swollen, but patient has history of CVA and always lies on right side.  Patient also has OA and gout but less likely given above.  Right knee xray showed severe degenerative change with findings similar to prior study though advanced in severity, consistent with RA. RUE Doppler negative. Acetaminophen, salicylate levels within normal limits.  Started prednisone 20 mg daily then tapered to 15 mg daily then 10 mg daily, and patient was still pain-free.  Continued prednisone 10 mg daily at discharge with close PCP follow-up.   6. Hyperkalemia, resolved- K 3.8 on day of discharge. Patient received kayexelate 30 g on day of admission.   Discharge Vitals:   BP 148/73  Pulse 104  Temp(Src) 97.9 F (36.6 C) (Oral)  Resp 22  Ht 5\' 4"  (1.626 m)  Wt 122 lb 9.2 oz (55.6 kg)  BMI 21.03 kg/m2  SpO2 100%  Discharge Labs:  Results for orders placed during the hospital encounter of 09/21/13 (from the past 24 hour(s))  BASIC METABOLIC PANEL      Status: Abnormal   Collection Time    09/25/13  6:26 AM      Result Value Range   Sodium 138  135 - 145 mEq/L   Potassium 3.8  3.5 - 5.1 mEq/L   Chloride 98  96 - 112 mEq/L   CO2 23  19 - 32 mEq/L   Glucose, Bld 103 (*) 70 - 99 mg/dL   BUN 39 (*) 6 - 23 mg/dL   Creatinine, Ser 1.02 (*) 0.50 - 1.10 mg/dL   Calcium 8.3 (*) 8.4 - 10.5 mg/dL   GFR calc non Af Amer 31 (*) >90 mL/min   GFR calc Af Amer 36 (*) >90 mL/min    Signed: Rocco Serene, MD 09/25/2013, 1:30 PM   Time Spent on Discharge: 40 minutes Services Ordered on Discharge: [facility] PT Equipment Ordered on Discharge: none

## 2013-09-25 NOTE — Progress Notes (Signed)
Subjective: Kim [name] is doing better this morning.  States the her pain is still controlled.  She reports some dizziness again after being turned during her bath.   She is now back in atrial fibrillation, rate 100s.    Objective: Vital signs in last 24 hours: Filed Vitals:   09/25/13 1130 09/25/13 1200 09/25/13 1300 09/25/13 1400  BP:  148/73  141/70  Pulse:  106 104   Temp: 97.9 F (36.6 C)     TempSrc: Oral     Resp:  16 22 17   Height:      Weight:      SpO2:  100% 100%    Weight change: 1 lb 1.6 oz (0.5 kg)  Intake/Output Summary (Last 24 hours) at 09/25/13 1515 Last data filed at 09/25/13 1400  Gross per 24 hour  Intake   1333 ml  Output    920 ml  Net    413 ml   PEX General: alert, cooperative, and in no apparent distress HEENT: NCAT Neck: supple, no lymphadenopathy Lungs: clear to ascultation except for mild rales in right lung base, normal work of respiration, no wheezes, ronchi Heart: regular rate and rhythm, no murmurs, gallops, or rubs Abdomen: soft, non-tender, non-distended, normal bowel sounds Extremities: right arm and hand extremely swollen but no erythema or drainage, right knee TTP but no erythema; 2+ DP/PT pulses bilaterally, no cyanosis, clubbing, or edema Neurologic: alert & oriented X3, cranial nerves II-XII intact, strength grossly intact, sensation intact to light touch  Lab Results: Basic Metabolic Panel:  Recent Labs Lab 09/22/13 1305 09/23/13 0710 09/24/13 0510 09/25/13 0626  NA 138 136 137 138  K 4.3 4.2 3.8 3.8  CL 101 98 98 98  CO2 24 23 24 23   GLUCOSE 130* 130* 85 103*  BUN 32* 41* 42* 39*  CREATININE 1.28* 1.46* 1.50* 1.50*  CALCIUM 8.1* 8.5 8.2* 8.3*  MG 1.9 1.9  --   --   PHOS 4.2  --  3.7  --    CBC:  Recent Labs Lab 09/21/13 1150 09/22/13 1305 09/22/13 2100 09/23/13 0710  WBC 9.9 9.2 9.0 8.4  NEUTROABS 7.8* 7.8*  --   --   HGB 8.5* 7.0* 6.9* 7.7*  HCT 27.4* 21.7* 22.5* 25.4*  MCV 89.3 86.5 88.6 89.4    PLT 425* 370 379 433*   Cardiac Enzymes:  Recent Labs Lab 09/21/13 2200 09/22/13 1310 09/22/13 2100  TROPONINI <0.30 <0.30 <0.30   BNP:  Recent Labs Lab 09/21/13 1653  PROBNP 1389.0*   Urine Drug Screen: Drugs of Abuse     Component Value Date/Time   LABOPIA NONE DETECTED 01/26/2010 1130   COCAINSCRNUR NONE DETECTED 01/26/2010 1130   LABBENZ POSITIVE* 01/26/2010 1130   AMPHETMU NONE DETECTED 01/26/2010 1130   THCU NONE DETECTED 01/26/2010 1130   LABBARB  Value: NONE DETECTED        DRUG SCREEN FOR MEDICAL PURPOSES ONLY.  IF CONFIRMATION IS NEEDED FOR ANY PURPOSE, NOTIFY LAB WITHIN 5 DAYS.        LOWEST DETECTABLE LIMITS FOR URINE DRUG SCREEN Drug Class       Cutoff (ng/mL) Amphetamine      1000 Barbiturate      200 Benzodiazepine   200 Tricyclics       300 Opiates          300 Cocaine          300 THC  50 01/26/2010 1130    Urinalysis:  Recent Labs Lab 09/21/13 2055  COLORURINE AMBER*  LABSPEC 1.030  PHURINE 5.0  GLUCOSEU NEGATIVE  HGBUR MODERATE*  BILIRUBINUR SMALL*  KETONESUR NEGATIVE  PROTEINUR 30*  UROBILINOGEN 1.0  NITRITE NEGATIVE  LEUKOCYTESUR MODERATE*    Micro Results: Recent Results (from the past 240 hour(s))  CULTURE, BLOOD (ROUTINE X 2)     Status: None   Collection Time    09/21/13  5:45 PM      Result Value Range Status   Specimen Description BLOOD RIGHT FOOT   Final   Special Requests BOTTLES DRAWN AEROBIC AND ANAEROBIC 10CC   Final   Culture  Setup Time     Final   Value: 09/22/2013 00:47     Performed at [facility]   Culture     Final   Value:        BLOOD CULTURE RECEIVED NO GROWTH TO DATE CULTURE WILL BE HELD FOR 5 DAYS BEFORE ISSUING A FINAL NEGATIVE REPORT     Performed at [facility]   Report Status PENDING   Incomplete  CULTURE, BLOOD (ROUTINE X 2)     Status: None   Collection Time    09/21/13  5:55 PM      Result Value Range Status   Specimen Description BLOOD LEFT ARM   Final   Special Requests  BOTTLES DRAWN AEROBIC ONLY 10CC   Final   Culture  Setup Time     Final   Value: 09/22/2013 00:47     Performed at [facility]   Culture     Final   Value:        BLOOD CULTURE RECEIVED NO GROWTH TO DATE CULTURE WILL BE HELD FOR 5 DAYS BEFORE ISSUING A FINAL NEGATIVE REPORT     Performed at [facility]   Report Status PENDING   Incomplete  MRSA PCR SCREENING     Status: None   Collection Time    09/21/13  8:33 PM      Result Value Range Status   MRSA by PCR NEGATIVE  NEGATIVE Final   Comment:            The GeneXpert MRSA Assay (FDA     approved for NASAL specimens     only), is one component of a     comprehensive MRSA colonization     surveillance program. It is not     intended to diagnose MRSA     infection nor to guide or     monitor treatment for     MRSA infections.  URINE CULTURE     Status: None   Collection Time    09/21/13  8:55 PM      Result Value Range Status   Specimen Description URINE, CLEAN CATCH   Final   Special Requests NONE   Final   Culture  Setup Time     Final   Value: 09/22/2013 02:05     Performed at [facility] Count     Final   Value: 50,000 COLONIES/ML     Performed at [facility]   Culture     Final   Value: Multiple bacterial morphotypes present, none predominant. Suggest appropriate recollection if clinically indicated.     Performed at [facility]   Report Status 09/23/2013 FINAL   Final   Medications: I have reviewed the patient's current medications. Scheduled Meds: . antiseptic  oral rinse  15 mL Mouth Rinse BID  . calcium carbonate  2 tablet Oral BID  . diltiazem  240 mg Oral Daily  . feeding supplement (ENSURE COMPLETE)  237 mL Oral BID BM  . furosemide  40 mg Oral BID  . latanoprost  1 drop Both Eyes QHS  . metoprolol succinate  50 mg Oral Daily  . pantoprazole  40 mg Oral Q1200  . predniSONE  10 mg Oral Q breakfast  . Rivaroxaban  15 mg Oral Q supper  . sodium chloride  3  mL Intravenous Q12H   . sodium chloride Stopped (09/24/13 1600)   PRN Meds:.acetaminophen, ALPRAZolam, atropine, HYDROcodone-acetaminophen, meclizine Assessment/Plan: #Atrial fibrillation with RVR, resolved- back in atrial fibrillation now, rate 100s.  Patient has history of atrial fibrillation, on Xarelto at home (as well as diltiazem, metoprolol).  Of note, patient became bradycardic to 20s, symptomatic in ED day of admission, gave atropine and calcium gluconate then HR went back into 50s. Troponins x 3 negative.  - cardiology consult (outpatient cardiologist Dr. Algie Coffer) - since patient is rate controlled, continue current regimen of metoprolol-XL 50 mg daily and diltiazem-XR 240 mg daily - continue Xarelto - continue to monitor - PT recommending home health PT, this has been arranged with case management  #dCHF- portable CXR on admission showed severe pulmonary edema with moderate right pleural effusion and consolidation in right lung base. Repeat CXR 11/23 showed improving aeration of lungs with decreasing bilateral airspace disease; right effusion also decreased in size. CURB65 score of 2 (6.8% 30 day mortality).  Additional repeat CXR 11/24 showed minimal vascular congestion without overt edema. Afebrile, no leucocytosis, does not meet SIRS criteria. Lactic acid 8.91 --> 3.8 --> 3.3 --> 2.3. Satting 100% on RA though patient states she wears oxygen at home.  Recently admitted in 05/2013 for acute on chronic dCHF. Echo at that time showed EF 60-65%, normal systolic function, wall motion normal, grade 1 diastolic dysfunction, severe pulmonary hypertension. pBNP 1389 (3679 in 05/2013), no evidence of volume overload on exam (no crackles, no LE edema).  Patient has poor prognosis.  - continue home Lasix 40 mg PO BID as Cr stable and at baseline - hold off on IVFs given pulmonary edema    #AKI- Cr up to 1.5 today, unchanged  - avoiding IVFs for now given above  #Anion gap metabolic acidosis,  resolving- AG 17, likely due to lactic acidosis given lactic acid 8.9, now resolving (2.3). ABG attempted twice but unsuccessful.   -blood cultures x 2 NGTD, pending  #RA flare- Patient having increased pain in right arm and hand, right leg from baseline. She was diagnosed with RA >20 years ago, patient takes Tylenol at home for pain. ESR elevated at 82 on presentation. Right arm and hand swollen, but patient has history of CVA and lies on right side 24/7. Patient also has OA and gout but less likely given above. Right knee xray showed severe degenerative change with findings similar to prior study though advanced in severity, consistent with RA. RUE Doppler negative.  Acetaminophen, salicylate levels within normal limits.   -prednisone 10 mg daily for now, will d/c on this dose with close PCP follow-up -Norco 5-325 q8h prn pain   #Hyperkalemia, resolved- 3.8 today.  Patient received kayexelate 15 g in ED. Repeat BMP showed K 6.5 thus patient given another kayexelate 15 g.    Dispo:  Anticipated discharge today.   The patient does have a current PCP Evelena Peat, DO) and  does need an [facility] hospital follow-up appointment after discharge.   .Services Needed at time of discharge: Y = Yes, Blank = No PT:   OT:   RN:   Equipment:   Other:     LOS: 4 days   Rocco Serene, MD 09/25/2013, 3:15 PM

## 2013-09-25 NOTE — Progress Notes (Signed)
[facility] called for transport to home  ?

## 2013-09-25 NOTE — Progress Notes (Signed)
Pt. Discharged home via [facility]. Pt. Son Orvilla Fus called and is waiting on her at their home.

## 2013-09-25 NOTE — Discharge Instructions (Signed)
Please take your medicines as prescribed, remember to pick up your new steroid prescription at the pharmacy.  You have follow-up in our clinic next week, don't forget to attend that appointment!

## 2013-09-25 NOTE — Progress Notes (Signed)
  Date: 09/25/2013  Patient name: Kim [name]  Medical record number: [id number]  Date of birth: 06/10/1929   This patient has been seen and the plan of care was discussed with the house staff. Please see their note for complete details. I concur with their findings with the following additions/corrections:  This morning states she feels well. Denies CP or SOB.  Her rate is better controlled. She is not a candidate for EP procedures. Will continue on current BB/CC regimen. She is medically stable for D/C with follow up with cardiology as outpatient. Continue Xarelto.  Jonah Blue, DO, FACP Faculty [facility] Residency Program 09/25/2013, 3:49 PM

## 2013-09-28 LAB — CULTURE, BLOOD (ROUTINE X 2): Culture: NO GROWTH

## 2013-09-29 NOTE — Discharge Summary (Signed)
  Date: 09/29/2013  Patient name: Kim [name]  Medical record number: [id number]  Date of birth: [birth year]   This patient has been seen and the plan of care was discussed with the house staff. Please see their note for complete details. I concur with their findings and plan.  Jonah Blue, DO, FACP Faculty [facility] Medicine Residency Program 09/29/2013, 8:10 PM

## 2013-10-02 ENCOUNTER — Ambulatory Visit: Payer: Medicare Other | Admitting: Internal Medicine

## 2013-10-04 ENCOUNTER — Telehealth: Payer: Self-pay | Admitting: Internal Medicine

## 2013-10-04 NOTE — Telephone Encounter (Signed)
Rec'd phone a phone call from [facility].  Physical therapy was ordered from inpatient and they are unable to start PT services due to patient not responding to their phone calls.

## 2013-10-30 ENCOUNTER — Encounter (HOSPITAL_COMMUNITY): Payer: Self-pay | Admitting: Emergency Medicine

## 2013-10-30 ENCOUNTER — Emergency Department (HOSPITAL_COMMUNITY): Payer: Medicare Other

## 2013-10-30 ENCOUNTER — Inpatient Hospital Stay (HOSPITAL_COMMUNITY)
Admission: EM | Admit: 2013-10-30 | Discharge: 2013-12-01 | DRG: 871 | Disposition: E | Payer: Medicare Other | Attending: Internal Medicine | Admitting: Internal Medicine

## 2013-10-30 DIAGNOSIS — R601 Generalized edema: Secondary | ICD-10-CM

## 2013-10-30 DIAGNOSIS — L89153 Pressure ulcer of sacral region, stage 3: Secondary | ICD-10-CM

## 2013-10-30 DIAGNOSIS — L8992 Pressure ulcer of unspecified site, stage 2: Secondary | ICD-10-CM | POA: Diagnosis present

## 2013-10-30 DIAGNOSIS — R609 Edema, unspecified: Secondary | ICD-10-CM | POA: Diagnosis present

## 2013-10-30 DIAGNOSIS — L89209 Pressure ulcer of unspecified hip, unspecified stage: Secondary | ICD-10-CM | POA: Diagnosis present

## 2013-10-30 DIAGNOSIS — Z66 Do not resuscitate: Secondary | ICD-10-CM | POA: Diagnosis present

## 2013-10-30 DIAGNOSIS — I10 Essential (primary) hypertension: Secondary | ICD-10-CM

## 2013-10-30 DIAGNOSIS — A419 Sepsis, unspecified organism: Principal | ICD-10-CM

## 2013-10-30 DIAGNOSIS — E873 Alkalosis: Secondary | ICD-10-CM

## 2013-10-30 DIAGNOSIS — L0231 Cutaneous abscess of buttock: Secondary | ICD-10-CM | POA: Diagnosis present

## 2013-10-30 DIAGNOSIS — E41 Nutritional marasmus: Secondary | ICD-10-CM | POA: Diagnosis present

## 2013-10-30 DIAGNOSIS — L89109 Pressure ulcer of unspecified part of back, unspecified stage: Secondary | ICD-10-CM | POA: Diagnosis present

## 2013-10-30 DIAGNOSIS — E872 Acidosis, unspecified: Secondary | ICD-10-CM | POA: Diagnosis present

## 2013-10-30 DIAGNOSIS — E43 Unspecified severe protein-calorie malnutrition: Secondary | ICD-10-CM

## 2013-10-30 DIAGNOSIS — M109 Gout, unspecified: Secondary | ICD-10-CM | POA: Diagnosis present

## 2013-10-30 DIAGNOSIS — Z7901 Long term (current) use of anticoagulants: Secondary | ICD-10-CM

## 2013-10-30 DIAGNOSIS — I5033 Acute on chronic diastolic (congestive) heart failure: Secondary | ICD-10-CM

## 2013-10-30 DIAGNOSIS — M069 Rheumatoid arthritis, unspecified: Secondary | ICD-10-CM | POA: Diagnosis present

## 2013-10-30 DIAGNOSIS — Z87891 Personal history of nicotine dependence: Secondary | ICD-10-CM

## 2013-10-30 DIAGNOSIS — I509 Heart failure, unspecified: Secondary | ICD-10-CM | POA: Diagnosis present

## 2013-10-30 DIAGNOSIS — L8993 Pressure ulcer of unspecified site, stage 3: Secondary | ICD-10-CM | POA: Diagnosis present

## 2013-10-30 DIAGNOSIS — E785 Hyperlipidemia, unspecified: Secondary | ICD-10-CM

## 2013-10-30 DIAGNOSIS — E871 Hypo-osmolality and hyponatremia: Secondary | ICD-10-CM | POA: Diagnosis present

## 2013-10-30 DIAGNOSIS — IMO0002 Reserved for concepts with insufficient information to code with codable children: Secondary | ICD-10-CM

## 2013-10-30 DIAGNOSIS — I5032 Chronic diastolic (congestive) heart failure: Secondary | ICD-10-CM | POA: Diagnosis present

## 2013-10-30 DIAGNOSIS — I272 Pulmonary hypertension, unspecified: Secondary | ICD-10-CM | POA: Diagnosis present

## 2013-10-30 DIAGNOSIS — I129 Hypertensive chronic kidney disease with stage 1 through stage 4 chronic kidney disease, or unspecified chronic kidney disease: Secondary | ICD-10-CM | POA: Diagnosis present

## 2013-10-30 DIAGNOSIS — E8729 Other acidosis: Secondary | ICD-10-CM

## 2013-10-30 DIAGNOSIS — R6510 Systemic inflammatory response syndrome (SIRS) of non-infectious origin without acute organ dysfunction: Secondary | ICD-10-CM | POA: Insufficient documentation

## 2013-10-30 DIAGNOSIS — I4891 Unspecified atrial fibrillation: Secondary | ICD-10-CM

## 2013-10-30 DIAGNOSIS — I2789 Other specified pulmonary heart diseases: Secondary | ICD-10-CM | POA: Diagnosis present

## 2013-10-30 DIAGNOSIS — R821 Myoglobinuria: Secondary | ICD-10-CM | POA: Diagnosis present

## 2013-10-30 DIAGNOSIS — Z7401 Bed confinement status: Secondary | ICD-10-CM

## 2013-10-30 DIAGNOSIS — L89309 Pressure ulcer of unspecified buttock, unspecified stage: Secondary | ICD-10-CM | POA: Diagnosis present

## 2013-10-30 DIAGNOSIS — N183 Chronic kidney disease, stage 3 unspecified: Secondary | ICD-10-CM | POA: Diagnosis present

## 2013-10-30 DIAGNOSIS — R509 Fever, unspecified: Secondary | ICD-10-CM

## 2013-10-30 DIAGNOSIS — Z515 Encounter for palliative care: Secondary | ICD-10-CM

## 2013-10-30 DIAGNOSIS — I2510 Atherosclerotic heart disease of native coronary artery without angina pectoris: Secondary | ICD-10-CM | POA: Diagnosis present

## 2013-10-30 DIAGNOSIS — R627 Adult failure to thrive: Secondary | ICD-10-CM | POA: Diagnosis present

## 2013-10-30 DIAGNOSIS — R7989 Other specified abnormal findings of blood chemistry: Secondary | ICD-10-CM

## 2013-10-30 DIAGNOSIS — D649 Anemia, unspecified: Secondary | ICD-10-CM | POA: Diagnosis present

## 2013-10-30 LAB — COMPREHENSIVE METABOLIC PANEL
ALT: 18 U/L (ref 0–35)
AST: 36 U/L (ref 0–37)
Albumin: 1.7 g/dL — ABNORMAL LOW (ref 3.5–5.2)
Alkaline Phosphatase: 166 U/L — ABNORMAL HIGH (ref 39–117)
BUN: 27 mg/dL — ABNORMAL HIGH (ref 6–23)
CO2: 15 mEq/L — ABNORMAL LOW (ref 19–32)
Chloride: 100 mEq/L (ref 96–112)
Potassium: 5 mEq/L (ref 3.7–5.3)
Sodium: 136 mEq/L — ABNORMAL LOW (ref 137–147)
Total Bilirubin: 1.1 mg/dL (ref 0.3–1.2)
Total Protein: 6.3 g/dL (ref 6.0–8.3)

## 2013-10-30 LAB — CBC WITH DIFFERENTIAL/PLATELET
Basophils Relative: 0 % (ref 0–1)
Eosinophils Absolute: 0.3 10*3/uL (ref 0.0–0.7)
Eosinophils Relative: 1 % (ref 0–5)
HCT: 28.1 % — ABNORMAL LOW (ref 36.0–46.0)
Hemoglobin: 8.2 g/dL — ABNORMAL LOW (ref 12.0–15.0)
Lymphocytes Relative: 8 % — ABNORMAL LOW (ref 12–46)
MCH: 24.3 pg — ABNORMAL LOW (ref 26.0–34.0)
MCHC: 29.2 g/dL — ABNORMAL LOW (ref 30.0–36.0)
Monocytes Absolute: 1.5 10*3/uL — ABNORMAL HIGH (ref 0.1–1.0)
Monocytes Relative: 6 % (ref 3–12)
Neutro Abs: 21.5 10*3/uL — ABNORMAL HIGH (ref 1.7–7.7)
Neutrophils Relative %: 85 % — ABNORMAL HIGH (ref 43–77)
Platelets: 503 10*3/uL — ABNORMAL HIGH (ref 150–400)
RBC: 3.37 MIL/uL — ABNORMAL LOW (ref 3.87–5.11)

## 2013-10-30 LAB — TROPONIN I: Troponin I: <0.3 ng/mL (ref <0.30)

## 2013-10-30 LAB — URINE MICROSCOPIC-ADD ON

## 2013-10-30 LAB — URINALYSIS, ROUTINE W REFLEX MICROSCOPIC
Nitrite: NEGATIVE
Specific Gravity, Urine: 1.022 (ref 1.005–1.030)
pH: 5 (ref 5.0–8.0)

## 2013-10-30 LAB — POCT I-STAT TROPONIN I

## 2013-10-30 LAB — CG4 I-STAT (LACTIC ACID): Lactic Acid, Venous: 4.83 mmol/L — ABNORMAL HIGH (ref 0.5–2.2)

## 2013-10-30 MED ORDER — SODIUM CHLORIDE 0.9 % IV BOLUS (SEPSIS)
1000.0000 mL | Freq: Once | INTRAVENOUS | Status: AC
  Administered 2013-10-30: 1000 mL via INTRAVENOUS

## 2013-10-30 MED ORDER — SODIUM CHLORIDE 0.9 % IV BOLUS (SEPSIS)
1000.0000 mL | Freq: Once | INTRAVENOUS | Status: AC
  Administered 2013-10-31: 1000 mL via INTRAVENOUS

## 2013-10-30 MED ORDER — VANCOMYCIN HCL 500 MG IV SOLR
500.0000 mg | INTRAVENOUS | Status: DC
  Administered 2013-10-30: 500 mg via INTRAVENOUS
  Filled 2013-10-30: qty 500

## 2013-10-30 MED ORDER — SODIUM CHLORIDE 0.9 % IV SOLN
Freq: Once | INTRAVENOUS | Status: AC
  Administered 2013-10-30: 22:00:00 via INTRAVENOUS

## 2013-10-30 MED ORDER — PIPERACILLIN-TAZOBACTAM 3.375 G IVPB
3.3750 g | Freq: Three times a day (TID) | INTRAVENOUS | Status: DC
  Administered 2013-10-30 – 2013-11-02 (×9): 3.375 g via INTRAVENOUS
  Filled 2013-10-30 (×11): qty 50

## 2013-10-30 MED ORDER — SODIUM CHLORIDE 0.9 % IV SOLN
Freq: Once | INTRAVENOUS | Status: AC
  Administered 2013-10-30: 21:00:00 via INTRAVENOUS

## 2013-10-30 MED ORDER — ACETAMINOPHEN 650 MG RE SUPP
650.0000 mg | Freq: Once | RECTAL | Status: AC
  Administered 2013-10-30: 650 mg via RECTAL
  Filled 2013-10-30: qty 1

## 2013-10-30 MED ORDER — ACETAMINOPHEN 325 MG PO TABS: 650.0000 mg | ORAL_TABLET | Freq: Once | ORAL | Status: DC

## 2013-10-30 NOTE — Progress Notes (Signed)
ANTIBIOTIC CONSULT NOTE - INITIAL  Pharmacy Consult for vancomycin and zosyn Indication: rule out sepsis  No Known Allergies  Patient Measurements:   Adjusted Body Weight:   Vital Signs: Temp: 101.7 F (38.7 C) (12/31 2109) Temp src: Rectal (12/31 2109) BP: 135/63 mmHg (12/31 2109) Pulse Rate: 130 (12/31 2109) Intake/Output from previous day:   Intake/Output from this shift:    Labs: No results found for this basename: WBC, HGB, PLT, LABCREA, CREATININE,  in the last 72 hours The CrCl is unknown because both a height and weight (above a minimum accepted value) are required for this calculation. No results found for this basename: VANCOTROUGH, VANCOPEAK, VANCORANDOM, GENTTROUGH, GENTPEAK, GENTRANDOM, TOBRATROUGH, TOBRAPEAK, TOBRARND, AMIKACINPEAK, AMIKACINTROU, AMIKACIN,  in the last 72 hours   Microbiology: No results found for this or any previous visit (from the past 720 hour(s)).  Medical History: Past Medical History  Diagnosis Date  . HTN (hypertension)   . AF (atrial fibrillation)     On Xarelto as of 11/2011  . Diastolic heart failure     Echo 5/11: There was moderate concentric hypertrophy. Systolic function was vigorous. The best esimated ejection fraction was in the range of 65% to 70%. Wall motion was normal  . Gout   . RA (rheumatoid arthritis)   . Chronic renal insufficiency     Baseline approx: 1.7  . Glaucoma     No recent eye visits noted in EMR.   . Pulmonary artery hypertension     Per 5/11 echo, PA peak pressure: 69mm Hg  . Stroke   . Coronary artery disease     Medications:  Scheduled:  . acetaminophen  650 mg Rectal Once   Infusions:   Assessment: 79 yo with r/o sepsis will be started on vancomycin and zosyn therapy.  Historic SCr is ~1.5 (CrCl ~25).  Goal of Therapy:  Vancomycin trough level 15-20 mcg/ml  Plan:  1) Vancomycin 500mg  iv q24h and zosyn 3.375g iv q8h  2) F/u on serum creatinine to make sure dosing is ok 3) Monitor  culture and sensitivity when they are available. 4) Check vancomycin trough when it's appropriate.  Amiri Riechers, Tsz-Yin 10/23/2013,9:38 PM

## 2013-10-30 NOTE — ED Notes (Signed)
Patient presents today via EMS. Patient's mother reports she came home to check on her mother and noted her mother to be altered, not acting her usual.  Patient lives at home with her son who provides care for her. Upon arrival patient noted to have pressure ulcer to right buttocks, patient feels warm to touch. Patient states "i just don't feel good."

## 2013-10-30 NOTE — ED Notes (Signed)
2nd CG-4 result reported to provider

## 2013-10-30 NOTE — ED Provider Notes (Signed)
CSN: [id number]     Arrival date & time 10/06/2013  2053 History   First MD Initiated Contact with Patient 10/03/2013 2114     Chief Complaint  Patient presents with  . Altered Mental Status   (Consider location/radiation/quality/duration/timing/severity/associated sxs/prior Treatment) The history is provided by a relative, the EMS personnel and medical records.   Level 5 Caveat: Altered mental status. This is an 79 year old female with past medical history significant for her hypertension, AFIB anticoagulated with xarelto, CHF, prior CVA, rheumatoid arthritis, gout, presenting to the ED for altered mental status. Patient's daughter went to visit and noticed that patient was not acting like herself.  Pt lives with her son who has been provided her home care.  On daughters arrival, pt was dirty with foul odor, with a new pressure ulcer to her right buttock-- brought pt in for evalution.  On arrival, pt febrile and tachycardic, some tachypnea but no respiratory distress.  Pt is DNR.  Past Medical History  Diagnosis Date  . HTN (hypertension)   . AF (atrial fibrillation)     On Xarelto as of 11/2011  . Diastolic heart failure     Echo 5/11: There was moderate concentric hypertrophy. Systolic function was vigorous. The best esimated ejection fraction was in the range of 65% to 70%. Wall motion was normal  . Gout   . RA (rheumatoid arthritis)   . Chronic renal insufficiency     Baseline approx: 1.7  . Glaucoma     No recent eye visits noted in EMR.   . Pulmonary artery hypertension     Per 5/11 echo, PA peak pressure: 69mm Hg  . Stroke   . Coronary artery disease    Past Surgical History  Procedure Laterality Date  . Cardiac catheterization  4/02     Minimal luminal irregularities without significant focal stenosis.  Marland Kitchen Extracapsular cataract extraction with intraocular lens  11/08    Right eye, Dr. Mitzi Davenport   Family History  Problem Relation Age of Onset  . Diabetes Mother   . Heart  disease Father 41    Currently deceased  . Stroke Sister 75  . Stroke Mother 40   History  Substance Use Topics  . Smoking status: Former Smoker    Types: Cigarettes    Quit date: 10/11/1971  . Smokeless tobacco: Not on file  . Alcohol Use: No   OB History   Grav Para Term Preterm Abortions TAB SAB Ect Mult Living                 Review of Systems  Unable to perform ROS: Mental status change    Allergies  Review of patient's allergies indicates no known allergies.  Home Medications   Current Outpatient Rx  Name  Route  Sig  Dispense  Refill  . acetaminophen (TYLENOL) 325 MG tablet   Oral   Take 325 mg by mouth every 6 (six) hours as needed. pain         . ALPRAZolam (XANAX) 0.25 MG tablet   Oral   Take 1 tablet (0.25 mg total) by mouth at bedtime as needed for anxiety.   30 tablet   0   . calcium carbonate (TUMS - DOSED IN MG ELEMENTAL CALCIUM) 500 MG chewable tablet   Oral   Chew 1 tablet by mouth 2 (two) times daily as needed for indigestion or heartburn.         . colchicine 0.6 MG tablet   Oral  Take 0.6 mg by mouth daily as needed (for gout attacks).         Marland Kitchen diltiazem (TIAZAC) 240 MG 24 hr capsule   Oral   Take 1 capsule (240 mg total) by mouth daily.   30 capsule   1   . feeding supplement (ENSURE COMPLETE) LIQD   Oral   Take 237 mLs by mouth 2 (two) times daily between meals.   60 Bottle   1   . furosemide (LASIX) 40 MG tablet   Oral   Take 40 mg by mouth 2 (two) times daily as needed for fluid or edema.         . isosorbide-hydrALAZINE (BIDIL) 20-37.5 MG per tablet   Oral   Take 1 tablet by mouth 2 (two) times daily.   60 tablet   1   . latanoprost (XALATAN) 0.005 % ophthalmic solution   Both Eyes   Place 1 drop into both eyes at bedtime.         . meclizine (ANTIVERT) 12.5 MG tablet   Oral   Take 1 tablet (12.5 mg total) by mouth 2 (two) times daily as needed for dizziness.   30 tablet   2   . metoprolol succinate  (TOPROL-XL) 50 MG 24 hr tablet   Oral   Take 1 tablet (50 mg total) by mouth daily.   30 tablet   3   . pantoprazole (PROTONIX) 40 MG tablet   Oral   Take 1 tablet (40 mg total) by mouth daily at 12 noon.   30 tablet   1   . potassium chloride SA (K-DUR,KLOR-CON) 20 MEQ tablet   Oral   Take 1 tablet (20 mEq total) by mouth daily.   30 tablet   3   . predniSONE (DELTASONE) 10 MG tablet   Oral   Take 1 tablet (10 mg total) by mouth daily with breakfast.   10 tablet   0   . Rivaroxaban (XARELTO) 15 MG TABS tablet   Oral   Take 1 tablet (15 mg total) by mouth daily.   30 tablet   11    BP 141/120  Pulse 140  Temp(Src) 101.7 F (38.7 C) (Rectal)  Resp 28  SpO2 100%  Physical Exam  Nursing note and vitals reviewed. Constitutional: She appears well-developed and well-nourished.  Appears dirty with strong odor; warm to touch  HENT:  Head: Normocephalic and atraumatic.  Mouth/Throat: Oropharynx is clear and moist.  Eyes: Conjunctivae and EOM are normal. Pupils are equal, round, and reactive to light.  Neck: Normal range of motion.  Cardiovascular: Normal rate, regular rhythm and normal heart sounds.   Pulmonary/Chest: Breath sounds normal. Tachypnea noted. No respiratory distress. She has no wheezes. She has no rhonchi.  Abdominal: Soft. Bowel sounds are normal.  Genitourinary:  Large amount of dirt in groin folds; Stage [name] pressure ulcer to right buttock with some purulent drainage; multiple small skin tears along right lateral hip  Musculoskeletal: Normal range of motion.  Upper extremities contracted against chest wall; swelling of bilateral hands  Neurological: She is alert.  Awake, alert, following limited commands, spontaneously moving all extremities, few word responses  Skin: Skin is warm and dry.  Psychiatric: She has a normal mood and affect.    ED Course  Procedures (including critical care time)   Date: 10/31/2013  Rate: 133  Rhythm: atrial  fibrillation  QRS Axis: normal  Intervals: indeterminate  ST/T Wave abnormalities: indeterminate  Conduction Disutrbances:indeterminate  Narrative Interpretation:   Old EKG Reviewed: unchanged   Labs Review Labs Reviewed  CBC WITH DIFFERENTIAL - Abnormal; Notable for the following:    WBC 25.3 (*)    RBC 3.37 (*)    Hemoglobin 8.2 (*)    HCT 28.1 (*)    MCH 24.3 (*)    MCHC 29.2 (*)    RDW 19.8 (*)    Platelets 503 (*)    All other components within normal limits  CG4 I-STAT (LACTIC ACID) - Abnormal; Notable for the following:    Lactic Acid, Venous 4.83 (*)    All other components within normal limits  CULTURE, BLOOD (ROUTINE X 2)  CULTURE, BLOOD (ROUTINE X 2)  URINE CULTURE  URINALYSIS, ROUTINE W REFLEX MICROSCOPIC  COMPREHENSIVE METABOLIC PANEL  TROPONIN I  POCT I-STAT TROPONIN I   Imaging Review Dg Chest Port 1 View  11/29/2013   CLINICAL DATA:  Code to sepsis.  Shortness of breath and fever.  EXAM: PORTABLE CHEST - 1 VIEW  COMPARISON:  09/23/2013.  FINDINGS: Shallow inspiration with elevation of the right hemidiaphragm. Small right pleural effusion. The atelectasis or infiltration in the lung bases, greater on the right. Heart size is normal. Mild pulmonary vascular congestion. Stable appearance since previous study.  IMPRESSION: Infiltration or atelectasis in the lung bases, greater on the right, with small right pleural effusion.   Electronically Signed   By: Burman Nieves M.D.   On: November 04, 2013 21:40    EKG Interpretation   None       MDM   1. Sepsis   2. Elevated lactic acid level   3. Fever   4. Atrial fibrillation   5. Chronic anticoagulation   6. HYPERTENSION   7. HYPERLIPIDEMIA    On arrival pt is febrile and tachycardiac, level 2 code sepsis activated. Pt tachypneic without respiratory distress.  Airway patent, BP stable. Will initiate workup including labs, chest x-ray, urinalysis, urine and blood cultures. Broad spectrum Vanc/Zosyn  initiated.  EKG AFIB-- pt has hx of the same, currently on xarelto.  Trop negative.  Labs with significant leukocytosis at 25.3.  CXR with questionable infiltrate. U/a without definitive infection, culture pending.  After 2 L IV fluid bolus repeat lactic acid increased to 5.11.  Consulted critical care, declined admission but available for consult if needed.  Spoke with IM resident-- have evaluated pt in the ED and will admit.  Garlon Hatchet, PA-C 10/31/13 0121

## 2013-10-31 ENCOUNTER — Inpatient Hospital Stay (HOSPITAL_COMMUNITY): Payer: Medicare Other

## 2013-10-31 ENCOUNTER — Encounter (HOSPITAL_COMMUNITY): Payer: Self-pay | Admitting: Emergency Medicine

## 2013-10-31 DIAGNOSIS — L89153 Pressure ulcer of sacral region, stage 3: Secondary | ICD-10-CM | POA: Diagnosis present

## 2013-10-31 DIAGNOSIS — D72829 Elevated white blood cell count, unspecified: Secondary | ICD-10-CM

## 2013-10-31 DIAGNOSIS — L8993 Pressure ulcer of unspecified site, stage 3: Secondary | ICD-10-CM

## 2013-10-31 DIAGNOSIS — E872 Acidosis, unspecified: Secondary | ICD-10-CM

## 2013-10-31 DIAGNOSIS — R627 Adult failure to thrive: Secondary | ICD-10-CM

## 2013-10-31 DIAGNOSIS — A419 Sepsis, unspecified organism: Secondary | ICD-10-CM | POA: Diagnosis present

## 2013-10-31 DIAGNOSIS — I509 Heart failure, unspecified: Secondary | ICD-10-CM

## 2013-10-31 DIAGNOSIS — L89309 Pressure ulcer of unspecified buttock, unspecified stage: Secondary | ICD-10-CM

## 2013-10-31 DIAGNOSIS — IMO0002 Reserved for concepts with insufficient information to code with codable children: Secondary | ICD-10-CM | POA: Diagnosis present

## 2013-10-31 DIAGNOSIS — N183 Chronic kidney disease, stage 3 unspecified: Secondary | ICD-10-CM

## 2013-10-31 DIAGNOSIS — M069 Rheumatoid arthritis, unspecified: Secondary | ICD-10-CM

## 2013-10-31 DIAGNOSIS — I4891 Unspecified atrial fibrillation: Secondary | ICD-10-CM

## 2013-10-31 DIAGNOSIS — E871 Hypo-osmolality and hyponatremia: Secondary | ICD-10-CM

## 2013-10-31 LAB — BASIC METABOLIC PANEL
BUN: 27 mg/dL — ABNORMAL HIGH (ref 6–23)
CHLORIDE: 107 meq/L (ref 96–112)
CO2: 12 meq/L — AB (ref 19–32)
CREATININE: 1.18 mg/dL — AB (ref 0.50–1.10)
Calcium: 7.7 mg/dL — ABNORMAL LOW (ref 8.4–10.5)
GFR calc non Af Amer: 42 mL/min — ABNORMAL LOW (ref >90)
GFR, EST AFRICAN AMERICAN: 48 mL/min — AB (ref >90)
Glucose, Bld: 115 mg/dL — ABNORMAL HIGH (ref 70–99)
Potassium: 4.6 mEq/L (ref 3.7–5.3)
SODIUM: 139 meq/L (ref 137–147)

## 2013-10-31 LAB — GLUCOSE, CAPILLARY
GLUCOSE-CAPILLARY: 120 mg/dL — AB (ref 70–99)
Glucose-Capillary: 139 mg/dL — ABNORMAL HIGH (ref 70–99)
Glucose-Capillary: 114 mg/dL — ABNORMAL HIGH (ref 70–99)

## 2013-10-31 LAB — POCT I-STAT 3, ART BLOOD GAS (G3+)
Acid-base deficit: 9 mmol/L — ABNORMAL HIGH (ref 0.0–2.0)
BICARBONATE: 15.2 meq/L — AB (ref 20.0–24.0)
O2 SAT: 95 %
PCO2 ART: 26.7 mmHg — AB (ref 35.0–45.0)
PH ART: 7.37 (ref 7.350–7.450)
PO2 ART: 82 mmHg (ref 80.0–100.0)
Patient temperature: 101.7
TCO2: 16 mmol/L (ref 0–100)

## 2013-10-31 LAB — CBC
HEMATOCRIT: 26.5 % — AB (ref 36.0–46.0)
Hemoglobin: 7.8 g/dL — ABNORMAL LOW (ref 12.0–15.0)
MCH: 24.4 pg — ABNORMAL LOW (ref 26.0–34.0)
MCHC: 29.4 g/dL — ABNORMAL LOW (ref 30.0–36.0)
MCV: 82.8 fL (ref 78.0–100.0)
PLATELETS: 389 10*3/uL (ref 150–400)
RBC: 3.2 MIL/uL — AB (ref 3.87–5.11)
RDW: 19.7 % — AB (ref 11.5–15.5)
WBC: 21.9 10*3/uL — ABNORMAL HIGH (ref 4.0–10.5)

## 2013-10-31 LAB — URINE CULTURE
Colony Count: NO GROWTH
Culture: NO GROWTH

## 2013-10-31 LAB — CK: CK TOTAL: 156 U/L (ref 7–177)

## 2013-10-31 LAB — TROPONIN I: Troponin I: <0.3 ng/mL (ref <0.30)

## 2013-10-31 LAB — MRSA PCR SCREENING: MRSA by PCR: NEGATIVE

## 2013-10-31 LAB — SALICYLATE LEVEL: Salicylate Lvl: <2 mg/dL — ABNORMAL LOW (ref 2.8–20.0)

## 2013-10-31 MED ORDER — DILTIAZEM HCL ER BEADS 240 MG PO CP24
240.0000 mg | ORAL_CAPSULE | Freq: Every day | ORAL | Status: DC
  Administered 2013-10-31 – 2013-11-01 (×2): 240 mg via ORAL
  Filled 2013-10-31 (×3): qty 1

## 2013-10-31 MED ORDER — VANCOMYCIN HCL IN DEXTROSE 750-5 MG/150ML-% IV SOLN
750.0000 mg | INTRAVENOUS | Status: DC
  Administered 2013-10-31 – 2013-11-01 (×2): 750 mg via INTRAVENOUS
  Filled 2013-10-31 (×3): qty 150

## 2013-10-31 MED ORDER — SODIUM CHLORIDE 0.9 % IJ SOLN
3.0000 mL | Freq: Two times a day (BID) | INTRAMUSCULAR | Status: DC
  Administered 2013-11-01 – 2013-11-02 (×2): 3 mL via INTRAVENOUS

## 2013-10-31 MED ORDER — ACETAMINOPHEN 325 MG PO TABS
650.0000 mg | ORAL_TABLET | Freq: Four times a day (QID) | ORAL | Status: DC | PRN
  Filled 2013-10-31: qty 2

## 2013-10-31 MED ORDER — METOPROLOL SUCCINATE ER 50 MG PO TB24
50.0000 mg | ORAL_TABLET | Freq: Every day | ORAL | Status: DC
  Administered 2013-10-31: 50 mg via ORAL
  Filled 2013-10-31: qty 1

## 2013-10-31 MED ORDER — METOPROLOL SUCCINATE ER 50 MG PO TB24
50.0000 mg | ORAL_TABLET | Freq: Every day | ORAL | Status: DC
Start: 2013-11-01 — End: 2013-11-02
  Administered 2013-11-01: 50 mg via ORAL
  Filled 2013-10-31 (×2): qty 1

## 2013-10-31 MED ORDER — CALCIUM CARBONATE ANTACID 500 MG PO CHEW
1.0000 | CHEWABLE_TABLET | Freq: Two times a day (BID) | ORAL | Status: DC | PRN
Start: 2013-10-31 — End: 2013-11-02
  Filled 2013-10-31: qty 1

## 2013-10-31 MED ORDER — SODIUM CHLORIDE 0.9 % IJ SOLN
3.0000 mL | Freq: Two times a day (BID) | INTRAMUSCULAR | Status: DC
  Administered 2013-10-31: 3 mL via INTRAVENOUS

## 2013-10-31 MED ORDER — PANTOPRAZOLE SODIUM 40 MG PO TBEC
40.0000 mg | DELAYED_RELEASE_TABLET | Freq: Every day | ORAL | Status: DC
  Administered 2013-10-31 – 2013-11-02 (×3): 40 mg via ORAL
  Filled 2013-10-31 (×3): qty 1

## 2013-10-31 MED ORDER — ONDANSETRON HCL 4 MG/2ML IJ SOLN
4.0000 mg | Freq: Four times a day (QID) | INTRAMUSCULAR | Status: DC | PRN
Start: 2013-10-31 — End: 2013-11-04

## 2013-10-31 MED ORDER — ENSURE COMPLETE PO LIQD: 237.0000 mL | Freq: Two times a day (BID) | ORAL | Status: DC

## 2013-10-31 MED ORDER — ACETAMINOPHEN 650 MG RE SUPP: 650.0000 mg | Freq: Four times a day (QID) | RECTAL | Status: DC | PRN

## 2013-10-31 MED ORDER — ONDANSETRON HCL 4 MG PO TABS: 4.0000 mg | ORAL_TABLET | Freq: Four times a day (QID) | ORAL | Status: DC | PRN

## 2013-10-31 MED ORDER — SODIUM CHLORIDE 0.9 % IV SOLN
250.0000 mL | INTRAVENOUS | Status: DC | PRN
Start: 2013-10-31 — End: 2013-10-31

## 2013-10-31 MED ORDER — SODIUM CHLORIDE 0.9 % IJ SOLN
3.0000 mL | INTRAMUSCULAR | Status: DC | PRN
Start: 2013-10-31 — End: 2013-10-31

## 2013-10-31 MED ORDER — LATANOPROST 0.005 % OP SOLN
1.0000 [drp] | Freq: Every day | OPHTHALMIC | Status: DC
  Administered 2013-10-31 – 2013-11-01 (×2): 1 [drp] via OPHTHALMIC
  Filled 2013-10-31: qty 2.5

## 2013-10-31 MED ORDER — RIVAROXABAN 15 MG PO TABS
15.0000 mg | ORAL_TABLET | Freq: Every day | ORAL | Status: DC
  Administered 2013-10-31 – 2013-11-02 (×3): 15 mg via ORAL
  Filled 2013-10-31 (×3): qty 1

## 2013-10-31 MED ORDER — COLLAGENASE 250 UNIT/GM EX OINT
TOPICAL_OINTMENT | Freq: Every day | CUTANEOUS | Status: DC
  Administered 2013-10-31 – 2013-11-01 (×2): via TOPICAL
  Filled 2013-10-31: qty 30

## 2013-10-31 MED ORDER — TRAMADOL HCL 50 MG PO TABS
50.0000 mg | ORAL_TABLET | Freq: Three times a day (TID) | ORAL | Status: DC
  Administered 2013-10-31 – 2013-11-01 (×3): 50 mg via ORAL
  Filled 2013-10-31 (×4): qty 1

## 2013-10-31 NOTE — ED Provider Notes (Signed)
Medical screening examination/treatment/procedure(s) were conducted as a shared visit with non-physician practitioner(s) and myself.  I personally evaluated the patient during the encounter.  EKG Interpretation   None        CRITICAL CARE Performed by: Hoy Morn Total critical care time: 30 Critical care time was exclusive of separately billable procedures and treating other patients. Critical care was necessary to treat or prevent imminent or life-threatening deterioration. Critical care was time spent personally by me on the following activities: development of treatment plan with patient and/or surrogate as well as nursing, discussions with consultants, evaluation of patient's response to treatment, examination of patient, obtaining history from patient or surrogate, ordering and performing treatments and interventions, ordering and review of laboratory studies, ordering and review of radiographic studies, pulse oximetry and re-evaluation of patient's condition.  Patient with occult septic shock with rising lactate despite appropriate resuscitation.  Critical care consultative and states given her age and DO NOT RESUSCITATE status that the patient would best be admitted by the hospitalist service.  IV antibiotics initiated.  Unclear etiology, questionable infiltrate.  Admit to the step down unit   Hoy Morn, MD 10/31/13 [phone]

## 2013-10-31 NOTE — Progress Notes (Signed)
Paged Dr. Mathis Fare re: no urine output and bladder scan of app. 100 ml. No new orders received.

## 2013-10-31 NOTE — H&P (Signed)
Date: 10/31/2013               Patient Name:  Kim [name] MRN: [id number]  DOB: Dec 02, 1928 Age / Sex: 80 y.o., female   PCP: Duwaine Maxin, DO         Medical Service: Internal Medicine Teaching Service         Attending Physician: Dr. Karren Cobble, MD    First Contact: Dr. Mathis Fare Pager: [phone]  Second Contact: Dr. Eula Fried Pager: [phone]       After Hours (After 5p/  First Contact Pager: [phone]  weekends / holidays): Second Contact Pager: [phone]   Chief Complaint: Pain all over  History of Present Illness: DENESE [name] is a 80 year old african [place] female with an extensive PMH including RA (at least 55 year history), paroxysmal A fib (Rate control with Metoprolol and Diltaizem, A/C Xarelto) with concern for Tachy-brady syndrome although patient has declined PPM, HFpEF (grade 1 DD on 06/27/13), pulmonary hypertension CKD Stage 3 (baseline Cr ~1.3). Kim [name] was brought to [facility] by her daughter who visited her today and reports she was not acting like her self.  The daughter reported to the ED physician that the patient was dirty with a foul odor.  Ms Hirth lives with her son.  On arrival to the ED Ms [name] was found to be febrile, tachycardic, tachypnea.  The ED initiated Code Sepsis but patient is DNR and was not a candidate for ICU admission. On our arrival at patient's bedside her daughter had already gone home.  Patient does report that she has pain all over from her rheumatoid arthritis.  Right now the pain is severe but she reports that this can be typical of her.  She reports that her hands are more swollen than usual but that also happens from time to time.  She reports that she is taking her medications as prescribed on the last hospital discharge, although she apparently never refilled her prednisone.   Meds: Current Facility-Administered Medications  Medication Dose Route Frequency Provider Last Rate Last Dose  . piperacillin-tazobactam  (ZOSYN) IVPB 3.375 g  3.375 g Intravenous Q8H Hoy Morn, MD   3.375 g at 10/17/2013 2147  . sodium chloride 0.9 % bolus 1,000 mL  1,000 mL Intravenous Once Larene Pickett, PA-C      . vancomycin (VANCOCIN) 500 mg in sodium chloride 0.9 % 100 mL IVPB  500 mg Intravenous Q24H Hoy Morn, MD   500 mg at 10/24/2013 2227   Current Outpatient Prescriptions  Medication Sig Dispense Refill  . acetaminophen (TYLENOL) 325 MG tablet Take 325 mg by mouth every 6 (six) hours as needed. pain      . ALPRAZolam (XANAX) 0.25 MG tablet Take 1 tablet (0.25 mg total) by mouth at bedtime as needed for anxiety.  30 tablet  0  . calcium carbonate (TUMS - DOSED IN MG ELEMENTAL CALCIUM) 500 MG chewable tablet Chew 1 tablet by mouth 2 (two) times daily as needed for indigestion or heartburn.      . colchicine 0.6 MG tablet Take 0.6 mg by mouth daily as needed (for gout attacks).      Kim [name] diltiazem (TIAZAC) 240 MG 24 hr capsule Take 1 capsule (240 mg total) by mouth daily.  30 capsule  1  . feeding supplement (ENSURE COMPLETE) LIQD Take 237 mLs by mouth 2 (two) times daily between meals.  60 Bottle  1  . furosemide (LASIX) 40 MG tablet  Take 40 mg by mouth 2 (two) times daily as needed for fluid or edema.      . isosorbide-hydrALAZINE (BIDIL) 20-37.5 MG per tablet Take 1 tablet by mouth 2 (two) times daily.  60 tablet  1  . latanoprost (XALATAN) 0.005 % ophthalmic solution Place 1 drop into both eyes at bedtime.      . meclizine (ANTIVERT) 12.5 MG tablet Take 1 tablet (12.5 mg total) by mouth 2 (two) times daily as needed for dizziness.  30 tablet  2  . metoprolol succinate (TOPROL-XL) 50 MG 24 hr tablet Take 1 tablet (50 mg total) by mouth daily.  30 tablet  3  . pantoprazole (PROTONIX) 40 MG tablet Take 1 tablet (40 mg total) by mouth daily at 12 noon.  30 tablet  1  . potassium chloride SA (K-DUR,KLOR-CON) 20 MEQ tablet Take 1 tablet (20 mEq total) by mouth daily.  30 tablet  3  . Rivaroxaban (XARELTO) 15 MG TABS  tablet Take 1 tablet (15 mg total) by mouth daily.  30 tablet  11    Allergies: Allergies as of 10/04/2013  . (No Known Allergies)   Past Medical History  Diagnosis Date  . HTN (hypertension)   . AF (atrial fibrillation)     On Xarelto as of 11/2011  . Diastolic heart failure     Echo 5/11: There was moderate concentric hypertrophy. Systolic function was vigorous. The best esimated ejection fraction was in the range of 65% to 70%. Wall motion was normal  . Gout   . RA (rheumatoid arthritis)   . Chronic renal insufficiency     Baseline approx: 1.7  . Glaucoma     No recent eye visits noted in EMR.   . Pulmonary artery hypertension     Per 5/11 echo, PA peak pressure: 37m Hg  . Stroke   . Coronary artery disease    Past Surgical History  Procedure Laterality Date  . Cardiac catheterization  4/02     Minimal luminal irregularities without significant focal stenosis.  .Kim KitchenExtracapsular cataract extraction with intraocular lens  11/08    Right eye, Dr. BRicki Miller  Family History  Problem Relation Age of Onset  . Diabetes Mother   . Heart disease Father 848   Currently deceased  . Stroke Sister 644 . Stroke Mother 850  History   Social History  . Marital Status: Widowed    Spouse Name: N/A    Number of Children: N/A  . Years of Education: N/A   Occupational History  . 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 Smoking status: Former Smoker    Types: Cigarettes    Quit date: 10/11/1971  . Smokeless tobacco: Not on file  . Alcohol Use: No  . Drug Use: No  . Sexual Activity: No   Other Topics Concern  . Not on file   Social History Narrative   Financial assistance approved for 25% discount after Medicare pays for [facility] only, [facility].      [place].  Currently lives with her daughter and her 2 granddaughters. Has 3 children in town which she is close to and is able to rely on if  needed.  High school education. She normally lives alone and can ambulate with a walker. She normally walks herself to the bathroom and perform basic ADLs.  Review of Systems: Review of Systems  Constitutional: Positive for fever. Negative for chills and weight loss.  Eyes: Negative for blurred vision.  Respiratory: Negative for cough and shortness of breath.   Cardiovascular: Negative for chest pain, orthopnea and leg swelling.  Gastrointestinal: Negative for nausea, vomiting, abdominal pain, diarrhea and constipation.  Genitourinary: Negative for dysuria and frequency.  Musculoskeletal: Positive for falls (3 weeks ago) and joint pain.  Skin: Negative for rash.  Neurological: Negative for focal weakness and headaches.     Physical Exam: Blood pressure 125/49, pulse 62, temperature 101.7 F (38.7 C), temperature source Rectal, resp. rate 25, SpO2 100.00%. on 2L [place] Physical Exam  Nursing note and vitals reviewed. Constitutional: She is oriented to person, place, and time and well-developed, well-nourished, and in no distress. No distress.  Laying on right side  Eyes: EOM are normal.  Cardiovascular: Normal heart sounds.  An irregularly irregular rhythm present. Tachycardia present.   No murmur heard. Pulmonary/Chest: Breath sounds normal. Tachypnea noted. No respiratory distress. She has no wheezes. She has no rales.  Abdominal: Soft. Bowel sounds are normal. She exhibits no distension. There is no tenderness. There is no rebound and no guarding.  Musculoskeletal: She exhibits edema (of B/L UE, no pitting edema of lower extremities.).  Neurological: She is alert and oriented to person, place, and time.  Skin: Skin is warm and dry. She is not diaphoretic.  Patient was able to be rolled to right side but refused to be allowed to be rolled to her left.  ED physician documented Stage [name] sacral ulcer on Right buttock.    Wt Readings from Last 5 Encounters:  09/25/13 122 lb 9.2 oz  (55.6 kg)  07/04/13 109 lb 12.6 oz (49.8 kg)  02/01/13 121 lb 7.6 oz (55.1 kg)  09/21/12 119 lb 11.2 oz (54.296 kg)  01/04/12 132 lb 7.9 oz (60.1 kg)    Lab results: Basic Metabolic Panel:  Recent Labs  10/07/2013 2129  NA 136*  K 5.0  CL 100  CO2 15*  GLUCOSE 130*  BUN 27*  CREATININE 1.25*  CALCIUM 8.1*  AG: 21 Liver Function Tests:  Recent Labs  10/06/2013 2129  AST 36  ALT 18  ALKPHOS 166*  BILITOT 1.1  PROT 6.3  ALBUMIN 1.7*   CBC:  Recent Labs  10/15/2013 2129  WBC 25.3*  NEUTROABS 21.5*  HGB 8.2*  HCT 28.1*  MCV 83.4  PLT 503*   Cardiac Enzymes:  Recent Labs  10/23/2013 2129  TROPONINI <0.30   Urine Drug Screen: Drugs of Abuse     Component Value Date/Time   LABOPIA NONE DETECTED 01/26/2010 1130   COCAINSCRNUR NONE DETECTED 01/26/2010 1130   LABBENZ POSITIVE* 01/26/2010 1130   AMPHETMU NONE DETECTED 01/26/2010 1130   THCU NONE DETECTED 01/26/2010 1130   LABBARB  Value: NONE DETECTED        DRUG SCREEN FOR MEDICAL PURPOSES ONLY.  IF CONFIRMATION IS NEEDED FOR ANY PURPOSE, NOTIFY LAB WITHIN 5 DAYS.        LOWEST DETECTABLE LIMITS FOR URINE DRUG SCREEN Drug Class       Cutoff (ng/mL) Amphetamine      1000 Barbiturate      200 Benzodiazepine   325 Tricyclics       498 Opiates          300 Cocaine          300 THC  50 01/26/2010 1130    Urinalysis:  Recent Labs  10/07/2013 2156  COLORURINE AMBER*  LABSPEC 1.022  PHURINE 5.0  GLUCOSEU NEGATIVE  HGBUR MODERATE*  BILIRUBINUR SMALL*  KETONESUR NEGATIVE  PROTEINUR NEGATIVE  UROBILINOGEN 1.0  NITRITE NEGATIVE  LEUKOCYTESUR NEGATIVE    Imaging results:  Dg Chest Port 1 View  10/11/2013   CLINICAL DATA:  Code to sepsis.  Shortness of breath and fever.  EXAM: PORTABLE CHEST - 1 VIEW  COMPARISON:  09/23/2013.  FINDINGS: Shallow inspiration with elevation of the right hemidiaphragm. Small right pleural effusion. The atelectasis or infiltration in the lung bases, greater on the right. Heart size  is normal. Mild pulmonary vascular congestion. Stable appearance since previous study.  IMPRESSION: Infiltration or atelectasis in the lung bases, greater on the right, with small right pleural effusion.   Electronically Signed   By: Lucienne Capers M.D.   On: 10/20/2013 21:40    Other results: EKG: A fib with RVR, unchanged from previous.  Assessment & Plan by Problem: Ms Tacey Ruiz is a 80 year old female with an extensive PMH who is admitted for sepsis.  Sepsis -Patient presented with fever, tachycardia (although has history of A fib), tachypnea, and leukocytosis of 25.3 (although she takes prednisone for RA). Suspected source is decubitus ulcer on right buttock.  Patient was given ~3 L of NS in ED however she does have a history of CHF and developed CHF exacerbation on last admission after receiving 2.5 L of fluid.  Will give gentle hydration at this time. -Follow Blood Cultures - Continue Vanc and Zosyn per pharmacy  Increased Anion Gap Metabolic Acidosis -AG 25, DD (after correction for Albumin) 1.86 indicating a pure AG met acidosis. Likely secondary to Lactic Acidosis, lactic Acid 5.11.  Patient is currently being treated for sepsis although she did have an lactic acidosis on the last admission.  Chronic Heart Failure with preserved ejection fraction - Grade 1 DD noted on last echo 06/27/13.  Patient was difficult to exam but no gross pulmonary edema appreciated on pulmonary auscultation.  Patient did appear to have a large amount of edema predominately in her hands and is at risk for developing a CHF exacerbation given ~3L IVF resuscitation. -Monitor respiratory status closely - Gentle IVF hydration - check ABG - Patient is DNR/DNI but may benefit from BiPap if needed - Monitor Daily weights  A fib with RVR  - Patient has known history of afib previously rate controlled with diltiazem and metoprolol.  Due to symptomatic bradycardia on her last admission a PPM was discussed with her  but she declined this intervention. - Will continue patient's home doses of diltiazem and metoprolol and adjust as necessary. - Continue Xarelto for A/C.   Stage [name] Ulcer on Right Buttock - Documented by ED physician, patient in too much pain to allow for examination at time of admission. - Reevaluate in AM as patient tolerates. - Consult wound care  Rheumatoid Arthritis Patient was recently treated ~1 month ago in the hospital for similar complaints.  She reportedly has been compliant with prednisone but may be an unreliable historian. She does not appear to have any specific joint that bother's her more than other joints.  She has no redness or increased warmth over her joints, I do not suspect septic arthritis.  She apparently has not been taking the 79m of prednisone that she was prescribed at last hospital discharge (she was prescribed 10, and did not make it to hospital follow  up appointment).  Will need to consider restarting prednisone however this will likely delay wound healing and may encourage CHF exacerbation.  Hemoglobinuria - Moderate Hgb on U/A but 0-2 RBC on microscopy.  Concern for rhabdomyolysis, patient has already received a large amount of IVF will continue to monitor -Check CK total  Failure to Thrive -Albumin 1.7, patient has been bedbound for the past few months, physical therapy ordered on last admission was unable to be scheduled.  Per daughter patient was also apparently leaving in filth. - Consider social work, nutrition, occupational and physical therapy consults in AM.  CKD Stage 3 -Creatine 1.25, at baseline.  Metabolic Derangements - Hyponatremic (Na 136), likely secondary to poor PO intake  Diet: Heart healthy Code Status: DNR/DNI DVT PPx: Xarelto Dispo: Disposition is deferred at this time, awaiting improvement of current medical problems. Anticipated discharge in approximately 2-3 day(s). There is some concern about the patient's home situation as  the clinic has been unable to contact the patient at home on multiple occasions over the past few months, and the potential unsanitary living condition that the daughter reports.  This will need to be revisited before discharge and patient may be a candidate for hospice care.  The patient does have a current PCP Duwaine Maxin, DO) and does need an [facility] hospital follow-up appointment after discharge.  The patient does have transportation limitations that hinder transportation to clinic appointments.  Signed: Joni Reining, DO 10/31/2013, 12:50 AM

## 2013-10-31 NOTE — H&P (Signed)
Internal Medicine Attending Admission Note Date: 10/31/2013  Patient name: Kim [name] [facility] Medical record number: [id number] Date of birth: 1929/09/29 Age: 80 y.o. Gender: female  I saw and evaluated the patient. I reviewed the resident's note and I agree with the resident's findings and plan as documented in the resident's note.  Briefly, Kim [name] is an 80 year old woman with a past medical history notable for rheumatoid arthritis, possible tachy-brady syndrome complicated by paroxysmal atrial fibrillation, stage [name] chronic kidney disease, and diastolic heart failure who presented to the emergency department via her daughter for failure to thrive. The patient lives with her son but when her daughter visited yesterday she noted the patient was dirty, had a foul odor, and had been in a diaper for probably several days. In the emergency department the patient was noted to be febrile, tachycardic, and tachypnea. Concern for sepsis syndrome was raised and her lactic acid was found to be 5.11. In addition, an ABG demonstrated an acute respiratory alkalosis in addition to her anion gap metabolic acidosis. She was felt not to be a candidate for ICU admission and was therefore admitted by the internal medicine teaching service for further care. The patient's complaint was pain all over from her rheumatoid arthritis. It appears she never refilled the prednisone upon discharge last month.  The patient was resuscitated with intravenous normal saline and started on IV vancomycin and Zosyn for presumed sepsis syndrome. Examination revealed decubitus ulcers and abrasions on the lateral and posterior right flank and pelvis. One of the ulcers was not stagable and was draining foul-smelling pus. This raised the concern of a possible osteomyelitis and a plain film of the pelvis was obtained, but did not contain stigmata consistent with osteomyelitis.  Given her sepsis syndrome presentation with lactic acidosis, as  well as foul smelling drainage from a decubitus ulcer, our concern for osteomyelitis or a soft tissue abscess remains, very high as we believe this may have caused her presentation that was consistent with sepsis syndrome. We will therefore followup on the blood cultures and continue the vancomycin and Zosyn. We will further evaluate for osteomyelitis with an MRI of the pelvis. This should also help [place] detect a soft tissue abscess if present as a potential cause of her symptoms. For her pain we will start tramadol 50 mg by mouth every 8 hours as we would like to avoid prednisone to allow for better wound healing. With regards to her failure to thrive, we will obtain a social work and physical therapy consultation. I suspect she is a good candidate for a skilled nursing facility but this will need to be discussed further with the patient and her family. For her lactic acidosis and respiratory alkalosis we will check a salicylate level in case these were being used for her RA pain.  Finally, chest x-ray demonstrates mild fluid overload after her 3 L of normal saline she obtained for resuscitation. We will be very gentle with any further hydration if her blood pressures remained soft. I agree with the housestaff's plan to continue monitoring her in the step down unit.

## 2013-10-31 NOTE — Progress Notes (Signed)
ANTIBIOTIC CONSULT NOTE - FOLLOW UP  Pharmacy Consult for vancomycin Indication: r/o sepsis  No Known Allergies  Patient Measurements: Height: 5\' 4"  (162.6 cm) Weight: 143 lb 4.8 oz (65 kg) IBW/kg (Calculated) : 54.7  Vital Signs: Temp: 98.3 F (36.8 C) (01/01 0530) Temp src: Oral (01/01 0530) BP: 119/81 mmHg (01/01 0615) Pulse Rate: 88 (01/01 0615) Intake/Output from previous day: 12/31 0701 - 01/01 0700 In: -  Out: 300 [Urine:300] Intake/Output from this shift:    Labs:  Recent Labs  10/09/2013 2129  WBC 25.3*  HGB 8.2*  PLT 503*  CREATININE 1.25*   Estimated Creatinine Clearance: 30 ml/min (by C-G formula based on Cr of 1.25). No results found for this basename: VANCOTROUGH, VANCOPEAK, VANCORANDOM, GENTTROUGH, GENTPEAK, GENTRANDOM, TOBRATROUGH, TOBRAPEAK, TOBRARND, AMIKACINPEAK, AMIKACINTROU, AMIKACIN,  in the last 72 hours    Assessment: 80 yo female here with fever and tachycardia on antibiotics (vanc/zosyn) for sepsis. WBC= 25.3, tmax= 101.7, SCr= 1.25 and CrCl ~ 30.  Cultures are pending.  12/31 vanc 12/31 zosyn  12/31 urine 12/31 blood x2  Goal of Therapy:  Vancomycin trough level 15-20 mcg/ml  Plan:  -Change vancomycin to 750mg  IV q24hr -Continue zosyn as ordered -Will follow renal function, cultures and clinical progress  Hildred Laser, Pharm D 10/31/2013 7:53 AM

## 2013-10-31 NOTE — Evaluation (Addendum)
10/31/13 1300  PT EVALUATION   Last PT Received On 10/31/13  Assistance Needed +2  History of Present Illness Kim [name] is an 80 year old woman with a past medical history notable for rheumatoid arthritis, possible tachy-brady syndrome complicated by paroxysmal atrial fibrillation, stage [name] chronic kidney disease, and diastolic heart failure who presented to the emergency department via her daughter for failure to thrive. The patient lives with her son but when her daughter visited yesterday she noted the patient was dirty, had a foul odor, and had been in a diaper for probably several days.  She has foul smelling drainage from an ulcer on her R hip, R flank, and sacrum.  Precautions  Precautions Fall  Precaution Comments RA is uncontrolled and make any movement very painful  Restrictions  Weight Bearing Restrictions No  Home Living  Family/patient expects to be discharged to: Private residence  [place]  Available Help at Discharge Family;Available 24 hours/day  Type of [place] One level  [place] - 4 wheels;BSC;Wheelchair - manual;Shower seat;Cane - quad;Cane - single point;Grab bars - tub/shower  Prior Function  Level of Independence Needs assistance  Gait / Transfers Assistance Needed Hasn't ambulated since april.  She fell then and hasn't moved since.  ADL's / Homemaking Assistance Needed assist for all adls, pt receives sponge baths  Communication / Swallowing Assistance Needed no  Comments She has w/c, bedside commode, etc but she is unable to use them in her current state  Communication  Communication (Difficult to understand- mumbling at times)  Cognition  Arousal/Alertness Lethargic  Behavior During Therapy Flat affect  Overall Cognitive Status Impaired/Different from baseline  Area of Impairment Safety/judgement;Memory;Problem solving  Memory Decreased short-term memory  Safety/Judgement  Decreased awareness of deficits  Problem Solving Slow processing;Decreased initiation;Difficulty sequencing  Upper Extremity Assessment  Upper Extremity Assessment Defer to OT evaluation  Lower Extremity Assessment  Lower Extremity Assessment RLE deficits/detail;LLE deficits/detail  RLE Deficits / Details bil knee flexion contractures hips and knees rotated to the right, trace movement of bil ankles and knees.  Assessment limited by pain and significant edema.  R lateral hip and flank and sacrum with wound.  Pt seems as though at baseline she turns to the right and it is very difficult to turn her off of the right side.   RLE Unable to fully assess due to pain;Unable to fully assess due to immobilization  LLE Deficits / Details bil knee flexion contractures hips and knees rotated to the right, trace movement of bil ankles and knees.  Assessment limited by pain and significant edema.  R lateral hip and flank and sacrum with wound.  Pt seems as though at baseline she turns to the right and it is very difficult to turn her off of the right side.   LLE Unable to fully assess due to pain;Unable to fully assess due to immobilization  Cervical / Trunk Assessment  Cervical / Trunk Assessment Other exceptions  Cervical / Trunk Exceptions Pt was unable to actively turn her neck to the left.  She is rotated ~45 degrees to the right.    Bed Mobility  Bed Mobility Rolling Left  Rolling Left 1: +1 Total assist  Scooting to HOB 1: +2 Total assist  Scooting to [facility]: Patient Percentage 0%  Details for Bed Mobility Assistance Pt rolled partially to the left to unweight her right wound.  Per pt she gets dizzy when she looks to the  left.  I don't know at this point if she could even turn her head to the left.  Pt unable to assist in mobility and screaming out in pain.    Transfers  Transfers Not assessed  Ambulation/Gait  Ambulation/Gait Assistance Not tested (comment)  Exercises  Exercises General Lower  Extremity  General Exercises - Lower Extremity  Ankle Circles/Pumps AROM;Both;5 reps  Heel Slides 5 reps;Both;AAROM  PT - End of Session  Activity Tolerance Patient limited by fatigue;Patient limited by pain  Patient left in bed;with call bell/phone within reach;with family/visitor present;with bed alarm set  Nurse Communication Mobility status;Other (comment) (d/c recs for SNF)  PT Assessment  PT Recommendation/Assessment Patient needs continued PT services  PT Problem List Decreased strength;Decreased activity tolerance;Decreased balance;Decreased mobility;Decreased knowledge of use of DME;Decreased range of motion  Barriers to Discharge Other (comment)  Barriers to Discharge Comments Pt is at a level of immobility that her family can no longer handle caring for her at home.    PT Therapy Diagnosis  Difficulty walking;Generalized weakness;Other (comment) (chronic pain)  PT Plan  PT Frequency Min 2X/week (trial x 1 week, I am not sure if she has ability to progress)  PT Treatment/Interventions Functional mobility training;Therapeutic activities;Therapeutic exercise;Balance training;Neuromuscular re-education;Patient/family education;Modalities  PT Recommendation  Follow Up Recommendations SNF  PT equipment Hospital bed (hoyer lift)  Individuals Consulted  Consulted and Agree with Results and Recommendations Patient;Family member/caregiver  Family Member Consulted Son  Acute Rehab PT Goals  Patient Stated Goal To get better  PT Goal Formulation With patient  Time For Goal Achievement 11/07/13  Potential to Achieve Goals Fair  PT Time Calculation  PT Start Time 1351  PT Stop Time 1428  PT Time Calculation (min) 37 min  PT General Charges  $$ ACUTE PT VISIT 1 Procedure  PT Evaluation  $Initial PT Evaluation Tier I 1 Procedure  PT Treatments  $Therapeutic Activity 8-22 mins  Written Expression  Dominant Hand Right  Avira Tillison B. [place]ill, [place], DPT [phone]

## 2013-10-31 NOTE — Consult Note (Addendum)
WOC wound consult note Reason for Consult: Consult requested for several wounds. Pt is unable to related the etiology of these areas or how long the right hip wound has been present. Pt has multiple systemic factors which can impair healing; Albumin 1.7, emaciated, immobile, incontinent, in constant pain when repositioned.  Wound type: Refer to previous nursing documentation for excellent documentation of wounds by bedside nurse yesterday. Upper sacrum with stage 2 wound; 2.5XX1X.1cm, pink and moist. No odor or drainage. Right hip with several partial thickness abrasions; these are NOT pressure ulcers: 1X1X.1cm, 1X1.5X.1cm, .6X.5X.1cm, pink and moist. No odor or drainage. Right hip unstageable wound; 3X2X1.5cm, 100% yellow slough.  This removes easily to outer wound, revealing undermining from 6:00 o'clock to 11:00 o'clock and large amt tan drainage with strong foul odor.  Inner wound bed with further slough.   Pressure Ulcer POA: Yes Dressing procedure/placement/frequency:  X-ray is pending to R/O osteomylitis. If this is present, please consult ortho service. Santyl ointment to chemically debride nonviable tissue to right hip.  Nutrition consult has been requested. Foam dressing to protect other sites and promote healing to upper sacrum and upper right hip. No family present to discuss plan of care and pt does not appear to understand when pressure ulcer treatment discussed. Julien Girt MSN, RN, [place], [place], [place]

## 2013-10-31 DEATH — deceased

## 2013-11-01 LAB — CBC WITH DIFFERENTIAL/PLATELET
BASOS PCT: 0 % (ref 0–1)
Basophils Absolute: 0 10*3/uL (ref 0.0–0.1)
EOS ABS: 0.3 10*3/uL (ref 0.0–0.7)
Eosinophils Relative: 1 % (ref 0–5)
HCT: 24.5 % — ABNORMAL LOW (ref 36.0–46.0)
Hemoglobin: 7.1 g/dL — ABNORMAL LOW (ref 12.0–15.0)
Lymphocytes Relative: 5 % — ABNORMAL LOW (ref 12–46)
Lymphs Abs: 1.2 10*3/uL (ref 0.7–4.0)
MCH: 24.4 pg — AB (ref 26.0–34.0)
MCHC: 29 g/dL — AB (ref 30.0–36.0)
MCV: 84.2 fL (ref 78.0–100.0)
MONOS PCT: 5 % (ref 3–12)
Monocytes Absolute: 1.1 10*3/uL — ABNORMAL HIGH (ref 0.1–1.0)
NEUTROS PCT: 89 % — AB (ref 43–77)
Neutro Abs: 20.8 10*3/uL — ABNORMAL HIGH (ref 1.7–7.7)
Platelets: 362 10*3/uL (ref 150–400)
RBC: 2.91 MIL/uL — ABNORMAL LOW (ref 3.87–5.11)
RDW: 20.1 % — ABNORMAL HIGH (ref 11.5–15.5)
WBC: 23.5 10*3/uL — ABNORMAL HIGH (ref 4.0–10.5)

## 2013-11-01 LAB — BASIC METABOLIC PANEL
BUN: 31 mg/dL — AB (ref 6–23)
CO2: 14 mEq/L — ABNORMAL LOW (ref 19–32)
Calcium: 7.6 mg/dL — ABNORMAL LOW (ref 8.4–10.5)
Chloride: 106 mEq/L (ref 96–112)
Creatinine, Ser: 1.33 mg/dL — ABNORMAL HIGH (ref 0.50–1.10)
GFR calc Af Amer: 42 mL/min — ABNORMAL LOW (ref >90)
GFR, EST NON AFRICAN AMERICAN: 36 mL/min — AB (ref >90)
Glucose, Bld: 86 mg/dL (ref 70–99)
Potassium: 5.3 mEq/L (ref 3.7–5.3)
Sodium: 138 mEq/L (ref 137–147)

## 2013-11-01 MED ORDER — TRAMADOL HCL 50 MG PO TABS
50.0000 mg | ORAL_TABLET | Freq: Three times a day (TID) | ORAL | Status: DC | PRN
  Administered 2013-11-01 (×2): 50 mg via ORAL
  Filled 2013-11-01: qty 1

## 2013-11-01 MED ORDER — ENSURE PUDDING PO PUDG
1.0000 | ORAL | Status: DC
Start: 2013-11-01 — End: 2013-11-04
  Administered 2013-11-01 – 2013-11-02 (×2): 1 via ORAL

## 2013-11-01 MED ORDER — TRAMADOL HCL 50 MG PO TABS: 50.0000 mg | ORAL_TABLET | Freq: Three times a day (TID) | ORAL | Status: DC | PRN

## 2013-11-01 NOTE — Evaluation (Signed)
Occupational Therapy Evaluation Patient Details Name: Kim [name] MRN: [id number] DOB: April 03, 1929 Today's Date: 11/01/2013 Time: 6301-6010 OT Time Calculation (min): 31 min  OT Assessment / Plan / Recommendation History of present illness Kim [name] is an 80 year old woman with a past medical history notable for rheumatoid arthritis, possible tachy-brady syndrome complicated by paroxysmal atrial fibrillation, stage [name] chronic kidney disease, and diastolic heart failure who presented to the emergency department via her daughter for failure to thrive. The patient lives with her son but when her daughter visited yesterday she noted the patient was dirty, had a foul odor, and had been in a diaper for probably several days.  She has foul smelling drainage from an ulcer on her R hip, R flank, and sacrum.   Clinical Impression   Pt is currently total to total +2 for all selfcare, self feeding, and bed mobility.  Feel this is probably pt's functional baseline based on current pain level and history of extensive RA.  Do not feel pt is appropriate for acute OT services at this time.  Recommend nursing assist with all aspects of selfcare and feeding at this time.  Pt will need SNF placement for around the clock assistance/supervision.      OT Assessment  Patient does not need any further OT services    Follow Up Recommendations  No OT follow up                Precautions / Restrictions Precautions Precautions: Fall Precaution Comments: RA is uncontrolled and make any movement very painful, increased swelling and limitations in bilateral hands Restrictions Weight Bearing Restrictions: No   Pertinent Vitals/Pain Pain 4/10 with movement of her UEs passively or with attempted scooting to the [facility]    ADL  Transfers/Ambulation Related to ADLs: Did not attempt transfer ADL Comments: Pt currently needs total assist for all aspects of selfcare including feeding and has been like this for a  while.  Feel she is not appropriate for OT services at this time.      Visit Information  Last OT Received On: 11/01/13 Assistance Needed: +1 History of Present Illness: Kim [name] is an 80 year old woman with a past medical history notable for rheumatoid arthritis, possible tachy-brady syndrome complicated by paroxysmal atrial fibrillation, stage [name] chronic kidney disease, and diastolic heart failure who presented to the emergency department via her daughter for failure to thrive. The patient lives with her son but when her daughter visited yesterday she noted the patient was dirty, had a foul odor, and had been in a diaper for probably several days.  She has foul smelling drainage from an ulcer on her R hip, R flank, and sacrum.       Prior [place] expects to be discharged to:: Private residence Living Arrangements: Children Available Help at Discharge: Family;Available 24 hours/day Type of Home: House Home Access: Ramped entrance Home Layout: One level Home Equipment: [place] - 4 wheels;Bedside commode;Wheelchair - manual;Shower seat;Cane - quad;Cane - single point;Grab bars - tub/shower Prior Function Level of Independence: Needs assistance Gait / Transfers Assistance Needed: Hasn't ambulated since april.  She fell then and hasn't moved since. ADL's / Homemaking Assistance Needed: assist for all adls, pt receives sponge baths Communication / Swallowing Assistance Needed: no Comments: She has w/c, bedside commode, etc but she is unable to use them in her current state Communication Communication:  (Difficult to understand- mumbling at times) Dominant Hand: Right  Vision/Perception Vision - History Baseline Vision: No visual deficits Patient Visual Report: No change from baseline Vision - Assessment Vision Assessment: Vision not tested Perception Perception: Within Functional Limits   Cognition  Cognition Arousal/Alertness:  Lethargic Behavior During Therapy: Flat affect Overall Cognitive Status: No family/caregiver present to determine baseline cognitive functioning    Extremity/Trunk Assessment Upper Extremity Assessment Upper Extremity Assessment: RUE deficits/detail;LUE deficits/detail RUE Deficits / Details: Pt with increased swelling throughout UE but more prominent in the hand and digits.  Noted contractions in the MPS and PIPs at aproximately 90 degrees flexion.  No passive extension available.  PROM at elbow flexion and extension available as well but no active movement noted. RUE: Unable to fully assess due to pain LUE Deficits / Details: Pt with increased swelling in the hand and digits with digits fixed in extension position.  Minimal PROM for digit flexion available.  PROM for elbow flexion WFLS, shoulder flexion 0-110 degrees with crepitus and pain noted. LUE: Unable to fully assess due to pain Lower Extremity Assessment Lower Extremity Assessment: Defer to PT evaluation     Mobility Bed Mobility Bed Mobility: Scooting to HOB Scooting to HOB: 1: +2 Total assist Scooting to [facility]: Patient Percentage: 0% Details for Bed Mobility Assistance: Pt unable to assist with any aspect of bed mobility        Balance Balance Balance Assessed: No   End of Session OT - End of Session Activity Tolerance: Patient limited by pain;Patient limited by lethargy Patient left: in bed;with nursing/sitter in room     Cia Garretson OTR/L 11/01/2013, 1:08 PM

## 2013-11-01 NOTE — Progress Notes (Signed)
Clinical Social Work Department BRIEF PSYCHOSOCIAL ASSESSMENT 11/01/2013  Patient:  Kim [name], Kim [name]     Account Number:  [id number]     Admit date:  10/28/2013  Clinical Social Worker:  Freeman Caldron  Date/Time:  11/01/2013 11:12 AM  Referred by:  Physician  Date Referred:  11/01/2013 Referred for  SNF Placement   Other Referral:   Interview type:  Other - See comment Other interview type:   CSW collaborated with RN and RNCM to complete assessment.    PSYCHOSOCIAL DATA Living Status:  FAMILY Admitted from facility:   Level of care:   Primary support name:  Samadhi Mahurin [phone]) Primary support relationship to patient:  CHILD, ADULT Degree of support available:   Poor, per medical team. Pt admitted from ED with foul smell and covered in dead skin. Per consult, pt lives with son but family is unable to manage pt care.    CURRENT CONCERNS Current Concerns  Post-Acute Placement   Other Concerns:   Failure to thrive    SOCIAL WORK ASSESSMENT / PLAN CSW went to pt's bedside, and pt was asleep. Per RN, who was in room, pt is less responsive today and was unable to communicate with RN. RN explains that she is going to request consult for palliative care. CSW called phone number listed in chart for pt's daughter Glenard Haring and got a voicemail with a full mailbox so CSW was unable to leave a message. CSW called a secondary phone number provided on the voicemail, but was unable to leave a message on this as well as line disconnected. CSW attempted call 3 times; phone rang then was disconnected each time. CSW will re-attempt to reach family at a later time or speak directly with pt if oriented x4.   Assessment/plan status:  Psychosocial Support/Ongoing Assessment of Needs Other assessment/ plan:   Information/referral to community resources:   SNF    PATIENT'S/FAMILY'S RESPONSE TO PLAN OF CARE: N/A       Ky Barban, MSW, [facility] Clinical Social  Worker [phone]

## 2013-11-01 NOTE — Care Management Note (Signed)
    Page 1 of 1   11/01/2013     10:15:08 AM   CARE MANAGEMENT NOTE 11/01/2013  Patient:  Kim [name], Kim [name]   Account Number:  [id number]  Date Initiated:  11/01/2013  Documentation initiated by:  Elissa Hefty  Subjective/Objective Assessment:   adm w sepsis     Action/Plan:   lives w son   Anticipated DC Date:     Anticipated DC Plan:    In-house referral  Clinical Social Worker      DC Forensic scientist  CM consult      Choice offered to / List presented to:             Status of service:   Medicare Important Message given?   (If response is "NO", the following Medicare IM given date fields will be blank) Date Medicare IM given:   Date Additional Medicare IM given:    Discharge Disposition:    Per UR Regulation:  Reviewed for med. necessity/level of care/duration of stay  If discussed at Long Length of Stay Meetings, dates discussed:    Comments:  1/2 1012 debbie Kamareon Sciandra rn,bsn sw was consulted for poss snf.

## 2013-11-01 NOTE — Progress Notes (Signed)
INITIAL NUTRITION ASSESSMENT  DOCUMENTATION CODES Per approved criteria  -Severe malnutrition in the context of chronic illness   INTERVENTION: Add Ensure Pudding po daily, each supplement provides 170 kcal and 4 grams of protein. Continue Ensure Complete PO BID. Downgrade to diet Dysphagia 3 given poor dentition (discussed with RN.) If aggressive nutrition therapy warranted, recommend initiation of enteral nutrition. Recommend initiation of Jevity 1.2 at 25 ml/hr, advance by 10 ml q 8 hours, to goal of 55 ml/hr. Add 30 ml Prostat liquid protein via tube daily. Goal regimen would provide: 1684 kcal, 89 grams protein, 1065 ml free water. Recommend monitoring of magnesium, potassium, and phosphorus daily for at least 3 days, MD to replete as needed, as pt is at risk for refeeding syndrome given severe malnutrition. RD to continue to follow nutrition care plan.  NUTRITION DIAGNOSIS: Inadequate oral intake related to FTT as evidenced by poor oral intake.   Goal: Intake to meet >90% of estimated nutrition needs.  Monitor:  weight trends, lab trends, I/O's, PO intake, supplement tolerance  Reason for Assessment: MD Consult for "Failure to Thrive"  80 y.o. female  Admitting Dx: Sepsis  ASSESSMENT: PMHx significant for RA, afib, pulmonary HTN, and CKD stage [name]. Admitted with AMS, fever and foul odor. Work-up reveals sepsis likely from sacral wound.  WOC RN saw pt 1/1, pt found to have stage II upper sacral wound, partial thickness abrasions to R hip, and unstageable R hip wound. X-ray currently pending to assess for osteomyelitis.  Discussed nutrition intake with RN. Pt is a dependent feeder, upper extremities are often in significant pain and pt is unable to feed self. Pt has been eating minimal, bites at best at meal times. Pt with Ensure Complete BID ordered, RN reports that pt has refused it. RN agreeable to trying to get patient to take Ensure Pudding, will add daily.   Nutrition  Focused Physical Exam:  Subcutaneous Fat:  Orbital Region: moderate depletion Upper Arm Region: n/a Thoracic and Lumbar Region: n/a  Muscle:  Temple Region: moderate depletion Clavicle Bone Region: moderate depletion Clavicle and Acromion Bone Region: n/a Scapular Bone Region: n/a Dorsal Hand: n/a Patellar Region: severe depletion Anterior Thigh Region: severe depletion Posterior Calf Region: severe depletion  Edema: bilateral upper extremities  Pt meets criteria for severe MALNUTRITION in the context of chronic illness as evidenced by intake of <75% x at least 1 month and severe muscle mass loss.  Labs reviewed: Sodium now WNL Potassium WNL Blood sugars: 120, 139, 114  Height: Ht Readings from Last 1 Encounters:  10/31/13 5\' 4"  (1.626 m)    Weight: Wt Readings from Last 1 Encounters:  11/01/13 152 lb 5.4 oz (69.1 kg)    Ideal Body Weight: 120 lb  % Ideal Body Weight: 128%  Wt Readings from Last 10 Encounters:  11/01/13 152 lb 5.4 oz (69.1 kg)  09/25/13 122 lb 9.2 oz (55.6 kg)  07/04/13 109 lb 12.6 oz (49.8 kg)  02/01/13 121 lb 7.6 oz (55.1 kg)  09/21/12 119 lb 11.2 oz (54.296 kg)  01/04/12 132 lb 7.9 oz (60.1 kg)  12/16/11 136 lb 1.6 oz (61.735 kg)  11/03/11 142 lb 6.7 oz (64.6 kg)  10/14/11 135 lb 12.9 oz (61.6 kg)  06/02/11 143 lb 9.6 oz (65.137 kg)    Usual Body Weight: variable  % Usual Body Weight: n/a  BMI:  Body mass index is 26.14 kg/(m^2).  Overweight  Estimated Nutritional Needs: Kcal: 1500 - 1700 Protein: 85 - 100 g  Fluid: 1.5 - 1.7 liters  Skin:  stage II upper sacral wound partial thickness abrasions to R hip unstageable R hip wound  Diet Order: Cardiac  EDUCATION NEEDS: -No education needs identified at this time   Intake/Output Summary (Last 24 hours) at 11/01/13 0855 Last data filed at 11/01/13 0200  Gross per 24 hour  Intake    240 ml  Output      0 ml  Net    240 ml    Last BM: 1/1  Labs:   Recent Labs Lab  2013-11-26 2129 10/31/13 0855 11/01/13 0700  NA 136* 139 138  K 5.0 4.6 5.3  CL 100 107 106  CO2 15* 12* 14*  BUN 27* 27* 31*  CREATININE 1.25* 1.18* 1.33*  CALCIUM 8.1* 7.7* 7.6*  GLUCOSE 130* 115* 86    CBG (last 3)   Recent Labs  10/31/13 0816 10/31/13 1256 10/31/13 1715  GLUCAP 120* 139* 114*    Scheduled Meds: . collagenase   Topical Daily  . diltiazem  240 mg Oral Daily  . feeding supplement (ENSURE COMPLETE)  237 mL Oral BID BM  . latanoprost  1 drop Both Eyes QHS  . metoprolol succinate  50 mg Oral Daily  . pantoprazole  40 mg Oral Q1200  . piperacillin-tazobactam (ZOSYN)  IV  3.375 g Intravenous Q8H  . Rivaroxaban  15 mg Oral QAC supper  . sodium chloride  3 mL Intravenous Q12H  . traMADol  50 mg Oral Q8H  . vancomycin  750 mg Intravenous Q24H    Continuous Infusions:   Past Medical History  Diagnosis Date  . HTN (hypertension)   . AF (atrial fibrillation)     On Xarelto as of 11/2011  . Diastolic heart failure     Echo 5/11: There was moderate concentric hypertrophy. Systolic function was vigorous. The best esimated ejection fraction was in the range of 65% to 70%. Wall motion was normal  . Gout   . RA (rheumatoid arthritis)   . Chronic renal insufficiency     Baseline approx: 1.7  . Glaucoma     No recent eye visits noted in EMR.   . Pulmonary artery hypertension     Per 5/11 echo, PA peak pressure: 32mm Hg  . Stroke   . Coronary artery disease     Past Surgical History  Procedure Laterality Date  . Cardiac catheterization  4/02     Minimal luminal irregularities without significant focal stenosis.  Marland Kitchen Extracapsular cataract extraction with intraocular lens  11/08    Right eye, Dr. Wynne Dust MS, RD, LDN Pager: [phone] After-hours pager: [phone]

## 2013-11-01 NOTE — Progress Notes (Addendum)
Subjective:  Pt seen and examined in AM. Per nurse she has been more drowsy since last night. She is sleepy this morning and alert x 2. She is not able to provide history.    Objective: Vital signs in last 24 hours: Filed Vitals:   11/01/13 0400 11/01/13 0500 11/01/13 0600 11/01/13 0700  BP: 83/52 101/42 86/44 90/46   Pulse: 86 86 86 86  Temp:      TempSrc:      Resp: 17 18 25 14   Height:      Weight:  69.1 kg (152 lb 5.4 oz)    SpO2: 100% 100% 100% 100%   Weight change: 4.1 kg (9 lb 0.6 oz)  Intake/Output Summary (Last 24 hours) at 11/01/13 0753 Last data filed at 11/01/13 0200  Gross per 24 hour  Intake    240 ml  Output      0 ml  Net    240 ml   Constitutional: Drowsy, but arousable Eyes: EOM are normal.  Cardiovascular: Normal heart sounds. Regular rate and rhythm. No murmur heard.  Pulmonary/Chest: Breath sounds normal.  No respiratory distress. She has no wheezes. She has no rales.  Abdominal: Soft. Bowel sounds are normal. She exhibits no distension. There is no tenderness. There is no rebound and no guarding.  Musculoskeletal: She exhibits +1 UE & LE edema with deformed and fused joints of hands Neurological: She is alert and oriented to person, place, and time.  Skin: Skin is warm and dry. She is not diaphoretic. Wounds dressed with no drainage     Lab Results: Basic Metabolic Panel:  Recent Labs Lab 15-Nov-2013 2129 10/31/13 0855  NA 136* 139  K 5.0 4.6  CL 100 107  CO2 15* 12*  GLUCOSE 130* 115*  BUN 27* 27*  CREATININE 1.25* 1.18*  CALCIUM 8.1* 7.7*   Liver Function Tests:  Recent Labs Lab Nov 15, 2013 2129  AST 36  ALT 18  ALKPHOS 166*  BILITOT 1.1  PROT 6.3  ALBUMIN 1.7*   No results found for this basename: LIPASE, AMYLASE,  in the last 168 hours No results found for this basename: AMMONIA,  in the last 168 hours CBC:  Recent Labs Lab 15-Nov-2013 2129 10/31/13 0855 11/01/13 0700  WBC 25.3* 21.9* 23.5*  NEUTROABS 21.5*  --  20.8*    HGB 8.2* 7.8* 7.1*  HCT 28.1* 26.5* 24.5*  MCV 83.4 82.8 84.2  PLT 503* 389 362   Cardiac Enzymes:  Recent Labs Lab 11/15/13 2129 10/31/13 0855 10/31/13 1106  CKTOTAL  --  156  --   TROPONINI <0.30 <0.30 <0.30   BNP: No results found for this basename: PROBNP,  in the last 168 hours D-Dimer: No results found for this basename: DDIMER,  in the last 168 hours CBG:  Recent Labs Lab 10/31/13 0816 10/31/13 1256 10/31/13 1715  GLUCAP 120* 139* 114*   Hemoglobin A1C: No results found for this basename: HGBA1C,  in the last 168 hours Fasting Lipid Panel: No results found for this basename: CHOL, HDL, LDLCALC, TRIG, CHOLHDL, LDLDIRECT,  in the last 168 hours Thyroid Function Tests: No results found for this basename: TSH, T4TOTAL, FREET4, T3FREE, THYROIDAB,  in the last 168 hours Coagulation: No results found for this basename: LABPROT, INR,  in the last 168 hours Anemia Panel: No results found for this basename: VITAMINB12, FOLATE, FERRITIN, TIBC, IRON, RETICCTPCT,  in the last 168 hours Urine Drug Screen: Drugs of Abuse     Component Value Date/Time  LABOPIA NONE DETECTED 01/26/2010 1130   COCAINSCRNUR NONE DETECTED 01/26/2010 1130   LABBENZ POSITIVE* 01/26/2010 1130   AMPHETMU NONE DETECTED 01/26/2010 1130   THCU NONE DETECTED 01/26/2010 1130   LABBARB  Value: NONE DETECTED        DRUG SCREEN FOR MEDICAL PURPOSES ONLY.  IF CONFIRMATION IS NEEDED FOR ANY PURPOSE, NOTIFY LAB WITHIN 5 DAYS.        LOWEST DETECTABLE LIMITS FOR URINE DRUG SCREEN Drug Class       Cutoff (ng/mL) Amphetamine      1000 Barbiturate      200 Benzodiazepine   573 Tricyclics       220 Opiates          300 Cocaine          300 THC              50 01/26/2010 1130    Alcohol Level: No results found for this basename: ETH,  in the last 168 hours Urinalysis:  Recent Labs Lab 10/13/2013 2156  [place] 5.0  [place] 1.0  NITRITE NEGATIVE  LEUKOCYTESUR NEGATIVE    Micro Results: Recent Results (from the past 240 hour(s))  CULTURE, BLOOD (ROUTINE X 2)     Status: None   Collection Time    10/27/2013  9:29 PM      Result Value Range Status   Specimen Description BLOOD LEFT FOREARM   Final   Special Requests BOTTLES DRAWN AEROBIC AND ANAEROBIC [facility] EACH   Final   Culture  Setup Time     Final   Value: 10/31/2013 01:35     Performed at [facility]   Culture     Final   Value: [place] IN CLUSTERS     Note: Gram Stain Report Called to,Read Back By and Verified With: ABBY STOPHEL ON 10/31/2013 AT 11:50P BY WILEJ     Performed at [facility]   Report Status PENDING   Incomplete  URINE CULTURE     Status: None   Collection Time    10/12/2013  9:56 PM      Result Value Range Status   Specimen Description URINE, CATHETERIZED   Final   Special Requests NONE   Final   Culture  Setup Time     Final   Value: 10/07/2013 23:01     Performed at [place]     Final   Value: NO GROWTH     Performed at [facility]   Culture     Final   Value: NO GROWTH     Performed at [facility]   Report Status 10/31/2013 FINAL   Final  MRSA PCR SCREENING     Status: None   Collection Time    10/31/13  5:50 AM      Result Value Range Status   MRSA by PCR NEGATIVE  NEGATIVE Final   Comment:            The GeneXpert MRSA Assay (FDA     approved for NASAL specimens     only), is one component of a     comprehensive MRSA colonization     surveillance program. It is not     intended to diagnose MRSA     infection nor to guide or  monitor treatment for     MRSA infections.   Studies/Results: Dg Pelvis 1-2 Views  10/31/2013   CLINICAL DATA:  Pressure ulcer.  Rule out osteomyelitis.  EXAM: PELVIS - 1-2 VIEW  COMPARISON:  Abdomen radiograph 07/01/2013  FINDINGS: Two views of the pelvis are  obtained. The patient is rotated to the right. At given patient rotation and fairly prominent bowel gas, image detail of the bony pelvis is degraded. No fracture is identified. No unexpected the gas collection is seen. No bony fragmentation or lucency is seen to suggest osteomyelitis.  Bowel gas is seen to the level of the rectum, without evidence of obstruction.  IMPRESSION: No radiographic evidence of osteomyelitis or acute bony injury. Image detail limited by a prominent bowel gas and patient rotation.   Electronically Signed   By: Curlene Dolphin M.D.   On: 10/31/2013 11:41   Dg Chest Port 1 View  10/07/2013   CLINICAL DATA:  Code to sepsis.  Shortness of breath and fever.  EXAM: PORTABLE CHEST - 1 VIEW  COMPARISON:  09/23/2013.  FINDINGS: Shallow inspiration with elevation of the right hemidiaphragm. Small right pleural effusion. The atelectasis or infiltration in the lung bases, greater on the right. Heart size is normal. Mild pulmonary vascular congestion. Stable appearance since previous study.  IMPRESSION: Infiltration or atelectasis in the lung bases, greater on the right, with small right pleural effusion.   Electronically Signed   By: Lucienne Capers M.D.   On: 10/06/2013 21:40   Medications: I have reviewed the patient's current medications. Scheduled Meds: . collagenase   Topical Daily  . diltiazem  240 mg Oral Daily  . feeding supplement (ENSURE COMPLETE)  237 mL Oral BID BM  . latanoprost  1 drop Both Eyes QHS  . metoprolol succinate  50 mg Oral Daily  . pantoprazole  40 mg Oral Q1200  . piperacillin-tazobactam (ZOSYN)  IV  3.375 g Intravenous Q8H  . Rivaroxaban  15 mg Oral QAC supper  . sodium chloride  3 mL Intravenous Q12H  . traMADol  50 mg Oral Q8H  . vancomycin  750 mg Intravenous Q24H   Continuous Infusions:  PRN Meds:.acetaminophen, acetaminophen, calcium carbonate, ondansetron (ZOFRAN) IV, ondansetron Assessment/Plan: Principal Problem:   Sepsis Active Problems:    Atrial fibrillation with RVR   Edema-right upper ext    Rheumatoid arthritis   Elevated lactic acid level   Failure to thrive   Sacral decubitus ulcer, stage [name]  Assessment: 80 year old woman with pmh of rheumatoid arthritis who presented on 12/31 and found to be septic most likely due to bacteremia due to pressure ulcer wounds.      Plan:  Sepsis most likely due to upper sacral & right hip wounds with bacteremia - afebrile with leukocytosis and +BC -Blood cultures x1/2 on 12/31 with GPC    -Awaiting MRI pelvis to r/o osteomyelitis, abscess -Day 3 vancomycin and zosyn -Caution with IVFs in setting of CHF -Continue to monitor CBC  Uncontrolled Rheumatoid Arthritis with severe deformity - Pt currently immobilized and unable to move due to 20-year history w/o Rheumatology f/u or DMARD use. Last flare 1 month ago that was treated with short course of corticosteroids.  -Tramadol 50 mg PRN, caution for sedation -Consider corticosteroids once acute infection resolves -PT/OT consults -Palliative care consult  Atrial Fibrillation - currently in sinus rhythm  -Diltiazem 240 mg daily for rate control -Metoprolol succinate 50 mg daily for rate control  -Xarelto 15 mg daily for AC  Grade 1  Diastolic Congestive Heart Failure - per 2D-echo on 06/27/13 -Caution with IVFs -Continue Metoprolol succinate 50 mg daily  -Hold furosemide 40 mg BID due to hypotension -Monitor daily weight and I&Os  Hypertension - currently hypotensive -Continue Metoprolol succinate 50 mg daily  -Hold furosemide 40 mg BID due to hypotension -Hold isosorbide-hydralazine 20-37.5 BID -Continue to monitor   Failure to thrive due to severe malnutrition - due to uncontrolled RA inducing immobilization -Nutrition consult -Feeding supplements -PT/OT/SW/ Palliative consult  Normocytic Anemia -  Most likely ACD due to rheumatoid arthritis. No reports of active bleeding. -FOBT  -Continue to monitor for bleeding    -Continue to monitor CBC -Consider blood transfusion if Hg<7  Myoglobinuria - Pt with + Hg dipstick w/o RBCs with normal CK most likely due to chronic muscle breakdown in setting of immobilization -PT/OT    Anion Gap - 21 on addmission to 18. Most likely due to lactic acidosis in setting of sepsis -Salicylate level WNL -Continue to monitor   Gout - currently without symptoms to suggest flare -Hold colchicine 0.6 mg daily  Chronic Kidney Disease Stage 3 - Cr on admission of 1.25 below baseline 1.3 -Continue to monitor  Diet: Dysphagia  DVT Ppx: Xarelto  Code: DNR/DNI  Dispo: Disposition is deferred at this time, awaiting improvement of current medical problems.  Anticipated discharge in approximately 3-5 day(s).   The patient does have a current PCP Duwaine Maxin, DO) and does need an [facility] hospital follow-up appointment after discharge.  The patient does have transportation limitations that hinder transportation to clinic appointments.  .Services Needed at time of discharge: Y = Yes, Blank = No PT:   OT:   RN:   Equipment:   Other:     LOS: 2 days   Juluis Mire, MD 11/01/2013, 7:53 AM

## 2013-11-02 ENCOUNTER — Inpatient Hospital Stay (HOSPITAL_COMMUNITY): Payer: Medicare Other

## 2013-11-02 DIAGNOSIS — I4891 Unspecified atrial fibrillation: Secondary | ICD-10-CM

## 2013-11-02 DIAGNOSIS — A419 Sepsis, unspecified organism: Secondary | ICD-10-CM

## 2013-11-02 DIAGNOSIS — E872 Acidosis, unspecified: Secondary | ICD-10-CM

## 2013-11-02 DIAGNOSIS — Z515 Encounter for palliative care: Secondary | ICD-10-CM

## 2013-11-02 LAB — BASIC METABOLIC PANEL
BUN: 35 mg/dL — ABNORMAL HIGH (ref 6–23)
CALCIUM: 8.3 mg/dL — AB (ref 8.4–10.5)
CO2: 13 mEq/L — ABNORMAL LOW (ref 19–32)
CREATININE: 1.75 mg/dL — AB (ref 0.50–1.10)
Chloride: 106 mEq/L (ref 96–112)
GFR, EST AFRICAN AMERICAN: 30 mL/min — AB (ref >90)
GFR, EST NON AFRICAN AMERICAN: 26 mL/min — AB (ref >90)
Glucose, Bld: 107 mg/dL — ABNORMAL HIGH (ref 70–99)
Potassium: 4.7 mEq/L (ref 3.7–5.3)
Sodium: 139 mEq/L (ref 137–147)

## 2013-11-02 LAB — CBC
HCT: 27.5 % — ABNORMAL LOW (ref 36.0–46.0)
Hemoglobin: 7.8 g/dL — ABNORMAL LOW (ref 12.0–15.0)
MCH: 24.5 pg — AB (ref 26.0–34.0)
MCHC: 28.4 g/dL — AB (ref 30.0–36.0)
MCV: 86.2 fL (ref 78.0–100.0)
PLATELETS: 324 10*3/uL (ref 150–400)
RBC: 3.19 MIL/uL — ABNORMAL LOW (ref 3.87–5.11)
RDW: 20.8 % — AB (ref 11.5–15.5)
WBC: 25.3 10*3/uL — ABNORMAL HIGH (ref 4.0–10.5)

## 2013-11-02 LAB — CULTURE, BLOOD (ROUTINE X 2)

## 2013-11-02 MED ORDER — SODIUM CHLORIDE 0.9 % IV BOLUS (SEPSIS)
250.0000 mL | Freq: Once | INTRAVENOUS | Status: AC
  Administered 2013-11-02: 250 mL via INTRAVENOUS

## 2013-11-02 MED ORDER — METOPROLOL SUCCINATE ER 50 MG PO TB24: 50.0000 mg | ORAL_TABLET | Freq: Every day | ORAL | Status: DC

## 2013-11-02 MED ORDER — OXYCODONE HCL 20 MG/ML PO CONC
5.0000 mg | ORAL | Status: DC | PRN
  Administered 2013-11-02: 5 mg via ORAL
  Filled 2013-11-02: qty 1

## 2013-11-02 MED ORDER — DIAZEPAM 5 MG/ML PO CONC: 5.0000 mg | Freq: Four times a day (QID) | ORAL | Status: DC | PRN

## 2013-11-02 MED ORDER — SODIUM CHLORIDE 0.9 % IV BOLUS (SEPSIS): 250.0000 mL | Freq: Once | INTRAVENOUS | Status: DC

## 2013-11-02 MED ORDER — OXYCODONE HCL 5 MG/5ML PO SOLN
5.0000 mg | ORAL | Status: DC | PRN
  Administered 2013-11-02 – 2013-11-03 (×2): 5 mg via ORAL
  Filled 2013-11-02 (×2): qty 5

## 2013-11-02 NOTE — Progress Notes (Signed)
Nurse informed by transferring nurse that pt has not had any  output since am/ Tct on call physician to make aware of this information.
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
## 2013-11-02 NOTE — Progress Notes (Addendum)
Subjective:  Pt seen and examined in AM. Overnight she was hypotensive and was given IV bolus administration. She is more alert and less drowsy today and able to respond to questions with one word answers. She still complains of diffuse body pain.    Objective: Vital signs in last 24 hours: Filed Vitals:   11/02/13 1430 11/02/13 1540 11/02/13 1600 11/02/13 1641  BP: 96/62 103/67 77/54 66/45   Pulse:    76  Temp:    97.6 F (36.4 C)  TempSrc:    Axillary  Resp: 16 13 11 12   Height:      Weight:      SpO2:    100%   Weight change: -0.6 kg (-1 lb 5.2 oz)  Intake/Output Summary (Last 24 hours) at 11/02/13 1803 Last data filed at 11/02/13 1700  Gross per 24 hour  Intake    743 ml  Output      0 ml  Net    743 ml   Constitutional: More alert, but unable to answer questions Eyes: EOM are normal.  Cardiovascular: Normal heart sounds. Regular rate and rhythm. No murmur heard.  Pulmonary/Chest: Breath sounds normal.  No respiratory distress. She has no wheezes. She has no rales.  Abdominal: Soft. Bowel sounds are normal. She exhibits no distension. There is no tenderness. There is no rebound and no guarding.  Musculoskeletal: She exhibits +2 UE & +1 LE edema with deformed and fused joints of hands Neurological: She is alert and oriented to person, place, and time.  Skin: Skin is warm and dry. She is not diaphoretic. Wounds dressed with no drainage     Lab Results: Basic Metabolic Panel:  Recent Labs Lab 11/01/13 0700 11/02/13 0550  NA 138 139  K 5.3 4.7  CL 106 106  CO2 14* 13*  GLUCOSE 86 107*  BUN 31* 35*  CREATININE 1.33* 1.75*  CALCIUM 7.6* 8.3*   Liver Function Tests:  Recent Labs Lab 10/11/2013 2129  AST 36  ALT 18  ALKPHOS 166*  BILITOT 1.1  PROT 6.3  ALBUMIN 1.7*   No results found for this basename: LIPASE, AMYLASE,  in the last 168 hours No results found for this basename: AMMONIA,  in the last 168 hours CBC:  Recent Labs Lab 10/27/2013 2129   11/01/13 0700 11/02/13 0550  WBC 25.3*  < > 23.5* 25.3*  NEUTROABS 21.5*  --  20.8*  --   HGB 8.2*  < > 7.1* 7.8*  HCT 28.1*  < > 24.5* 27.5*  MCV 83.4  < > 84.2 86.2  PLT 503*  < > 362 324  < > = values in this interval not displayed. Cardiac Enzymes:  Recent Labs Lab 09/30/2013 2129 10/31/13 0855 10/31/13 1106  CKTOTAL  --  156  --   TROPONINI <0.30 <0.30 <0.30   BNP: No results found for this basename: PROBNP,  in the last 168 hours D-Dimer: No results found for this basename: DDIMER,  in the last 168 hours CBG:  Recent Labs Lab 10/31/13 0816 10/31/13 1256 10/31/13 1715  GLUCAP 120* 139* 114*   Hemoglobin A1C: No results found for this basename: HGBA1C,  in the last 168 hours Fasting Lipid Panel: No results found for this basename: CHOL, HDL, LDLCALC, TRIG, CHOLHDL, LDLDIRECT,  in the last 168 hours Thyroid Function Tests: No results found for this basename: TSH, T4TOTAL, FREET4, T3FREE, THYROIDAB,  in the last 168 hours Coagulation: No results found for this basename: LABPROT, INR,  in the last 168 hours Anemia Panel: No results found for this basename: VITAMINB12, FOLATE, FERRITIN, TIBC, IRON, RETICCTPCT,  in the last 168 hours Urine Drug Screen: Drugs of Abuse     Component Value Date/Time   LABOPIA NONE DETECTED 01/26/2010 1130   COCAINSCRNUR NONE DETECTED 01/26/2010 1130   LABBENZ POSITIVE* 01/26/2010 1130   AMPHETMU NONE DETECTED 01/26/2010 1130   THCU NONE DETECTED 01/26/2010 1130   LABBARB  Value: NONE DETECTED        DRUG SCREEN FOR MEDICAL PURPOSES ONLY.  IF CONFIRMATION IS NEEDED FOR ANY PURPOSE, NOTIFY LAB WITHIN 5 DAYS.        LOWEST DETECTABLE LIMITS FOR URINE DRUG SCREEN Drug Class       Cutoff (ng/mL) Amphetamine      1000 Barbiturate      200 Benzodiazepine   732 Tricyclics       202 Opiates          300 Cocaine          300 THC              50 01/26/2010 1130    Alcohol Level: No results found for this basename: ETH,  in the last 168  hours Urinalysis:  Recent Labs Lab 10/15/2013 2156  [place] 1.022  PHURINE 5.0  [place] 1.0  NITRITE NEGATIVE  LEUKOCYTESUR NEGATIVE    Micro Results: Recent Results (from the past 240 hour(s))  CULTURE, BLOOD (ROUTINE X 2)     Status: None   Collection Time    10/27/2013  9:29 PM      Result Value Range Status   Specimen Description BLOOD LEFT FOREARM   Final   Special Requests BOTTLES DRAWN AEROBIC AND ANAEROBIC [name] EACH   Final   Culture  Setup Time     Final   Value: 10/31/2013 01:35     Performed at [facility]   Culture     Final   Value: STAPHYLOCOCCUS SPECIES (COAGULASE NEGATIVE)     Note: THE SIGNIFICANCE OF ISOLATING THIS ORGANISM FROM A SINGLE SET OF BLOOD CULTURES WHEN MULTIPLE SETS ARE DRAWN IS UNCERTAIN. PLEASE NOTIFY THE MICROBIOLOGY DEPARTMENT WITHIN ONE WEEK IF SPECIATION AND SENSITIVITIES ARE REQUIRED.     Note: Gram Stain Report Called to,Read Back By and Verified With: ABBY STOPHEL ON 10/31/2013 AT 11:50P BY WILEJ     Performed at [facility]   Report Status 11/02/2013 FINAL   Final  CULTURE, BLOOD (ROUTINE X 2)     Status: None   Collection Time    10/02/2013  9:40 PM      Result Value Range Status   Specimen Description BLOOD LEFT HAND   Final   Special Requests BOTTLES DRAWN AEROBIC ONLY 10CC   Final   Culture  Setup Time     Final   Value: 10/31/2013 01:31     Performed at [facility]   Culture     Final   Value:        BLOOD CULTURE RECEIVED NO GROWTH TO DATE CULTURE WILL BE HELD FOR 5 DAYS BEFORE ISSUING A FINAL NEGATIVE REPORT     Performed at [facility]   Report Status PENDING   Incomplete  URINE CULTURE     Status: None   Collection Time    10/04/2013  9:56 PM  Result Value Range Status   Specimen Description URINE, CATHETERIZED   Final   Special Requests NONE   Final   Culture  Setup  Time     Final   Value: 10/23/2013 23:01     Performed at [facility]     Final   Value: NO GROWTH     Performed at [facility]   Culture     Final   Value: NO GROWTH     Performed at [facility]   Report Status 10/31/2013 FINAL   Final  MRSA PCR SCREENING     Status: None   Collection Time    10/31/13  5:50 AM      Result Value Range Status   MRSA by PCR NEGATIVE  NEGATIVE Final   Comment:            The GeneXpert MRSA Assay (FDA     approved for NASAL specimens     only), is one component of a     comprehensive MRSA colonization     surveillance program. It is not     intended to diagnose MRSA     infection nor to guide or     monitor treatment for     MRSA infections.   Studies/Results: Mr Pelvis Wo Contrast  11/02/2013   CLINICAL DATA:  Sacral decubitus ulcer. Sepsis. Question osteomyelitis.  EXAM: MRI PELVIS WITHOUT CONTRAST  TECHNIQUE: Multiplanar, multisequence MR imaging was performed. No intravenous contrast was administered.  COMPARISON:  DG PELVIS 1-2 VIEWS dated 10/31/2013; MR L SPINE W/O dated 02/25/2005; DG HIP BILATERAL W/PELVIS dated 11/13/2006  FINDINGS: There is extensive soft tissue edema and ill-defined fluid throughout the subcutaneous fat and musculature of the pelvis and proximal thighs bilaterally. No dominant focal fluid collection is identified. There appears to be some ulceration of the skin and subcutaneous fat posterior to the right femoral greater trochanter. No abscess is apparent in this area.  There is no evidence of underlying femoral or pelvic bone destruction or marrow edema. There are moderate size hip joint effusions bilaterally with asymmetric fluid in the left iliopsoas bursa. There is no subchondral edema within the femoral heads or acetabula. There is no evidence of sacroiliitis.  Best seen on the sagittal images is progressive anterolisthesis at L4-5 with discal hyperintensity and mild endplate irregularity  asymmetric to the right. There is resulting right foraminal stenosis.  Small uterine fibroids are noted. No acute intrapelvic findings are demonstrated.  IMPRESSION: 1. No evidence of pelvic or proximal femoral osteomyelitis. 2. There is extensive soft tissue edema throughout the subcutaneous fat and musculature of the pelvis and both proximal thighs. Although potentially in part due to volume overload/anasarca, there is likely a component of cellulitis and myositis. No focal abscess identified. 3. Bilateral hip joint effusions with iliopsoas bursal fluid on the left. 4. Progressive anterolisthesis, discal hyperintensity and endplate irregularity at L4-5 compared with lumbar MRI from 2006. These findings could be degenerative, although discitis cannot be excluded.   Electronically Signed   By: Camie Patience M.D.   On: 11/02/2013 15:45   Medications: I have reviewed the patient's current medications. Scheduled Meds: . collagenase   Topical Daily  . feeding supplement (ENSURE COMPLETE)  237 mL Oral BID BM  . feeding supplement (ENSURE)  1 Container Oral Q24H  . latanoprost  1 drop Both Eyes QHS  . [START ON 11/03/2013] metoprolol succinate  50 mg Oral Daily  . pantoprazole  40 mg Oral Q1200  . piperacillin-tazobactam (ZOSYN)  IV  3.375 g Intravenous Q8H  . Rivaroxaban  15 mg Oral QAC supper  . sodium chloride  3 mL Intravenous Q12H  . vancomycin  750 mg Intravenous Q24H   Continuous Infusions:  PRN Meds:.acetaminophen, acetaminophen, calcium carbonate, diazepam, ondansetron (ZOFRAN) IV, ondansetron, oxyCODONE Assessment/Plan: Principal Problem:   Sepsis Active Problems:   Atrial fibrillation with RVR   Edema-right upper ext    Rheumatoid arthritis   Elevated lactic acid level   Failure to thrive   Sacral decubitus ulcer, stage [name]  Assessment: 80 year old woman with pmh of rheumatoid arthritis who presented on 12/31 and found to be septic most likely due to bacteremia due to pressure ulcer  wounds.      Plan:  Sepsis most likely due to upper sacral & right hip wounds- afebrile with leukocytosis and +BC -Blood cultures from 1/2 bottle  on 12/31 with coag neg staph, most likely concomitant    -MRI pelvis w/o evidence of osteomyelitis or abscess -D/C vancomycin and zosyn -->comfort care -No IVFs --> comfort care  Uncontrolled Rheumatoid Arthritis with severe deformities and resulting immbolization - Pt currently immobilized and barely able to move due to >20-year history of RA w/o Rheumatology f/u or DMARD use. Last flare 1 month ago that was treated with short course of corticosteroids.  -Diazepam PRN anxiety -Acetaminophen, oxycodone PRN pain      -PT/OT consults -Palliative care consult --> pain control, anti-emetic control, anxiety control    -Consider transfer to hospice facility  Atrial Fibrillation - currently in sinus rhythm  -D/C Diltiazem 240 mg daily --> palliative care  -D/C Metoprolol succinate 50 mg --> palliative care  -D/C Xarelto 15 mg daily --> palliative care  Grade 1 Diastolic Congestive Heart Failure - per 2D-echo on 06/27/13 -No IVFs due to palliative care  -D/C Metoprolol succinate 50 mg daily --> palliative care  -D/C Furosemide 40 mg BID ---> palliative care  Hypertension - currently hypotensive -D/C Metoprolol succinate 50 mg daily ----> palliative care  -D/C furosemide 40 mg BID ---> palliative care -D/C isosorbide-hydralazine 20-37.5 BID --> palliative care  Failure to thrive due to severe malnutrition - due to uncontrolled RA inducing severe deformity and resulting immobilization -Nutrition consult -Feeding supplements -PT/OT consults -Palliative consult --> comfort care per family   Normocytic Anemia -  Most likely ACD due to rheumatoid arthritis. No reports of active bleeding. -No blood transfusion ---> palliative care  Myoglobinuria - Pt with + Hg dipstick w/o RBCs with normal CK most likely due to chronic muscle breakdown in setting  of immobilization -PT/OT    Anion Gap - 20. Most likely due to lactic acidosis in setting of sepsis. Salicylate level WNL  Gout - currently without symptoms to suggest flare -D/C colchicine 0.6 mg daily ---> palliative care  Chronic Kidney Disease Stage 3 - Cr on admission of 1.25 below baseline 1.3  Diet: Dysphagia  DVT Ppx: None Code: DNR/DNI  Dispo: Disposition is deferred at this time, awaiting improvement of current medical problems.  Anticipated discharge in approximately 3-5 day(s).   The patient does have a current PCP Duwaine Maxin, DO) and does need an [facility] hospital follow-up appointment after discharge.  The patient does have transportation limitations that hinder transportation to clinic appointments.  .Services Needed at time of discharge: Y = Yes, Blank = No PT:   OT:   RN:   Equipment:   Other:     LOS: 3 days  Juluis Mire, MD 11/02/2013, 6:03 PM

## 2013-11-02 NOTE — Progress Notes (Signed)
Pt with SBP 60's; no current complaints of pain; answers some basic yes/no questions; family at bedside visiting with pt; questions encouraged and answered regarding pt's condition, code status, and poor prognosis; family verbalizes understanding; will continue to closely monitor

## 2013-11-02 NOTE — Progress Notes (Signed)
Attending Md group paged regarding patient's hypotension. 250 ns bolus was given per order from Md with no tangible improvement in blood pressure. Bp(79/53). No further orders given. i will continue to monitor

## 2013-11-03 DIAGNOSIS — Z515 Encounter for palliative care: Secondary | ICD-10-CM

## 2013-11-03 DIAGNOSIS — A419 Sepsis, unspecified organism: Secondary | ICD-10-CM

## 2013-11-03 DIAGNOSIS — R601 Generalized edema: Secondary | ICD-10-CM

## 2013-11-03 MED ORDER — HYDROMORPHONE HCL PF 1 MG/ML IJ SOLN
1.0000 mg | INTRAMUSCULAR | Status: DC | PRN
  Administered 2013-11-03: 1 mg via INTRAVENOUS
  Filled 2013-11-03: qty 1

## 2013-11-03 NOTE — Progress Notes (Signed)
Subjective:  Kim [name] was seen and examined at bedside this morning. [name] was by bedside. Kim [name] was sleeping and briefly woke up but was not talking. She is noted to not eat very much and the daughter has lots of questions in regards to her eating status and comfort care. I have tried to answer all her questions and have encouraged her to talk with the rest of her family in regards to goals of care, which she says she will do.   Objective: Vital signs in last 24 hours: Filed Vitals:   11/02/13 2000 11/02/13 2113 11/03/13 0528 11/03/13 1412  BP: 83/48 74/48 64/58  80/46  Pulse:  73 68 109  Temp: 97.3 F (36.3 C) 97.4 F (36.3 C) 97.3 F (36.3 C) 86.3 F (30.2 C)  TempSrc: Oral Axillary Axillary Axillary  Resp: 11 12 12 12   Height:  5\' 4"  (1.626 m)    Weight:  134 lb 11.2 oz (61.1 kg) 134 lb 11.2 oz (61.1 kg)   SpO2: 100% 100% 100% 100%   Weight change: -16 lb 5 oz (-7.4 kg)  Intake/Output Summary (Last 24 hours) at 11/03/13 1638 Last data filed at 11/02/13 1858  Gross per 24 hour  Intake    250 ml  Output      0 ml  Net    250 ml   Vitals reviewed. General: somnolent, sleeping, appears in distress upon any movement HEENT: PERRL Cardiac: tachycardia Pulm: difficult to auscultate due to poor patient effort Abd: soft, +BS present Ext: b/l upper extremity edema, noted deformity of hands with increased swelling, b/l lower extremity edema as well Neuro: in and out of sleep, not talking but mumbles and moans  Lab Results: Basic Metabolic Panel:  Recent Labs Lab 11/01/13 0700 11/02/13 0550  NA 138 139  K 5.3 4.7  CL 106 106  CO2 14* 13*  GLUCOSE 86 107*  BUN 31* 35*  CREATININE 1.33* 1.75*  CALCIUM 7.6* 8.3*   Liver Function Tests:  Recent Labs Lab 10/23/2013 2129  AST 36  ALT 18  ALKPHOS 166*  BILITOT 1.1  PROT 6.3  ALBUMIN 1.7*   CBC:  Recent Labs Lab 10/25/2013 2129  11/01/13 0700 11/02/13 0550  WBC 25.3*  < > 23.5*  25.3*  NEUTROABS 21.5*  --  20.8*  --   HGB 8.2*  < > 7.1* 7.8*  HCT 28.1*  < > 24.5* 27.5*  MCV 83.4  < > 84.2 86.2  PLT 503*  < > 362 324  < > = values in this interval not displayed. Cardiac Enzymes:  Recent Labs Lab 10/29/2013 2129 10/31/13 0855 10/31/13 1106  CKTOTAL  --  156  --   TROPONINI <0.30 <0.30 <0.30   CBG:  Recent Labs Lab 10/31/13 0816 10/31/13 1256 10/31/13 1715  GLUCAP 120* 139* 114*   Urine Drug Screen: Drugs of Abuse     Component Value Date/Time   LABOPIA NONE DETECTED 01/26/2010 1130   COCAINSCRNUR NONE DETECTED 01/26/2010 1130   LABBENZ POSITIVE* 01/26/2010 1130   AMPHETMU NONE DETECTED 01/26/2010 1130   THCU NONE DETECTED 01/26/2010 1130   LABBARB  Value: NONE DETECTED        DRUG SCREEN FOR MEDICAL PURPOSES ONLY.  IF CONFIRMATION IS NEEDED FOR ANY PURPOSE, NOTIFY LAB WITHIN 5 DAYS.        LOWEST DETECTABLE LIMITS FOR URINE DRUG SCREEN Drug Class       Cutoff (ng/mL) Amphetamine  1000 Barbiturate      200 Benzodiazepine   [id number] Tricyclics       [id number] Opiates          300 Cocaine          300 THC              50 01/26/2010 1130    Urinalysis:  Recent Labs Lab 10/09/2013 2156  COLORURINE AMBER*  LABSPEC 1.022  PHURINE 5.0  GLUCOSEU NEGATIVE  HGBUR MODERATE*  BILIRUBINUR SMALL*  KETONESUR NEGATIVE  PROTEINUR NEGATIVE  UROBILINOGEN 1.0  NITRITE NEGATIVE  LEUKOCYTESUR NEGATIVE   Micro Results: Recent Results (from the past 240 hour(s))  CULTURE, BLOOD (ROUTINE X 2)     Status: None   Collection Time    10/11/2013  9:29 PM      Result Value Range Status   Specimen Description BLOOD LEFT FOREARM   Final   Special Requests BOTTLES DRAWN AEROBIC AND ANAEROBIC [facility] EACH   Final   Culture  Setup Time     Final   Value: 10/31/2013 01:35     Performed at [facility]   Culture     Final   Value: STAPHYLOCOCCUS SPECIES (COAGULASE NEGATIVE)     Note: THE SIGNIFICANCE OF ISOLATING THIS ORGANISM FROM A SINGLE SET OF BLOOD CULTURES WHEN MULTIPLE  SETS ARE DRAWN IS UNCERTAIN. PLEASE NOTIFY THE MICROBIOLOGY DEPARTMENT WITHIN ONE WEEK IF SPECIATION AND SENSITIVITIES ARE REQUIRED.     Note: Gram Stain Report Called to,Read Back By and Verified With: ABBY STOPHEL ON 10/31/2013 AT 11:50P BY WILEJ     Performed at [facility]   Report Status 11/02/2013 FINAL   Final  CULTURE, BLOOD (ROUTINE X 2)     Status: None   Collection Time    10/29/2013  9:40 PM      Result Value Range Status   Specimen Description BLOOD LEFT HAND   Final   Special Requests BOTTLES DRAWN AEROBIC ONLY 10CC   Final   Culture  Setup Time     Final   Value: 10/31/2013 01:31     Performed at [facility]   Culture     Final   Value:        BLOOD CULTURE RECEIVED NO GROWTH TO DATE CULTURE WILL BE HELD FOR 5 DAYS BEFORE ISSUING A FINAL NEGATIVE REPORT     Performed at [facility]   Report Status PENDING   Incomplete  URINE CULTURE     Status: None   Collection Time    10/28/2013  9:56 PM      Result Value Range Status   Specimen Description URINE, CATHETERIZED   Final   Special Requests NONE   Final   Culture  Setup Time     Final   Value: 10/21/2013 23:01     Performed at [facility]     Final   Value: NO GROWTH     Performed at [facility]   Culture     Final   Value: NO GROWTH     Performed at [facility]   Report Status 10/31/2013 FINAL   Final  MRSA PCR SCREENING     Status: None   Collection Time    10/31/13  5:50 AM      Result Value Range Status   MRSA by PCR NEGATIVE  NEGATIVE Final   Comment:  The GeneXpert MRSA Assay (FDA     approved for NASAL specimens     only), is one component of a     comprehensive MRSA colonization     surveillance program. It is not     intended to diagnose MRSA     infection nor to guide or     monitor treatment for     MRSA infections.   Studies/Results: Mr Pelvis Wo Contrast  11/02/2013   CLINICAL DATA:  Sacral decubitus ulcer. Sepsis.  Question osteomyelitis.  EXAM: MRI PELVIS WITHOUT CONTRAST  TECHNIQUE: Multiplanar, multisequence MR imaging was performed. No intravenous contrast was administered.  COMPARISON:  DG PELVIS 1-2 VIEWS dated 10/31/2013; MR L SPINE W/O dated 02/25/2005; DG HIP BILATERAL W/PELVIS dated 11/13/2006  FINDINGS: There is extensive soft tissue edema and ill-defined fluid throughout the subcutaneous fat and musculature of the pelvis and proximal thighs bilaterally. No dominant focal fluid collection is identified. There appears to be some ulceration of the skin and subcutaneous fat posterior to the right femoral greater trochanter. No abscess is apparent in this area.  There is no evidence of underlying femoral or pelvic bone destruction or marrow edema. There are moderate size hip joint effusions bilaterally with asymmetric fluid in the left iliopsoas bursa. There is no subchondral edema within the femoral heads or acetabula. There is no evidence of sacroiliitis.  Best seen on the sagittal images is progressive anterolisthesis at L4-5 with discal hyperintensity and mild endplate irregularity asymmetric to the right. There is resulting right foraminal stenosis.  Small uterine fibroids are noted. No acute intrapelvic findings are demonstrated.  IMPRESSION: 1. No evidence of pelvic or proximal femoral osteomyelitis. 2. There is extensive soft tissue edema throughout the subcutaneous fat and musculature of the pelvis and both proximal thighs. Although potentially in part due to volume overload/anasarca, there is likely a component of cellulitis and myositis. No focal abscess identified. 3. Bilateral hip joint effusions with iliopsoas bursal fluid on the left. 4. Progressive anterolisthesis, discal hyperintensity and endplate irregularity at L4-5 compared with lumbar MRI from 2006. These findings could be degenerative, although discitis cannot be excluded.   Electronically Signed   By: Camie Patience M.D.   On: 11/02/2013 15:45    Medications: I have reviewed the patient's current medications. Scheduled Meds: . feeding supplement (ENSURE COMPLETE)  237 mL Oral BID BM  . feeding supplement (ENSURE)  1 Container Oral Q24H  . sodium chloride  3 mL Intravenous Q12H   Continuous Infusions:  PRN Meds:.acetaminophen, acetaminophen, diazepam, HYDROmorphone (DILAUDID) injection, ondansetron (ZOFRAN) IV, ondansetron, oxyCODONE Assessment/Plan: Principal Problem:   Sepsis Active Problems:   Atrial fibrillation with RVR   Edema-right upper ext    Rheumatoid arthritis   Elevated lactic acid level   Failure to thrive   Sacral decubitus ulcer, stage [name]  Assessment: Ms. Buenaventura is a 80 year old woman with PMH of rheumatoid arthritis admitted for sepsis with unclear etiology.    Sepsis most likely due to upper sacral & right hip wounds. Very somnolent, not eating much, hypotensive  -Blood cultures from 1/2 bottle on 12/31 with coag neg staph, most likely concomitant. NGTD on other bottle -MRI pelvis w/o evidence of osteomyelitis or abscess, but extensive soft tissue edema through subcutaneous fat and muscle, b/l hip joint effusions, and progressive anterolisthesis--cannot exclude discitis.  -IV antibiotics were discontinued by palliative care after decision made by family for comfort care.   Uncontrolled Rheumatoid Arthritis with severe deformities and resulting immobolization-- -Diazepam PRN anxiety -Acetaminophen,  oxycodone PRN pain      -Palliative care assistance appreciated --> pain control, anti-emetic control, anxiety control    -Recommended for hospice facility  Atrial Fibrillation - currently in sinus rhythm -all medications discontinued as she is now comfort care. Initially were holding BB and CCB due to hypotension and bradycardia.   Grade 1 Diastolic Congestive Heart Failure - per 2D-echo on 06/27/13. All medications have been discontinued with comfort care measures.   Hypertension--remains hypotensive.  Off all medications for comfort care.   Failure to thrive due to severe malnutrition - due to uncontrolled RA inducing severe deformity and resulting immobilization -Nutrition consult -Feeding supplements -PT/OT consults -Palliative consult --> comfort care per family   Normocytic Anemia -  Most likely ACD due to rheumatoid arthritis. No reports of active bleeding.  Myoglobinuria - Pt with + Hg dipstick w/o RBCs with normal CK most likely due to chronic muscle breakdown in setting of immobilization   Anion Gap - 20. Most likely due to lactic acidosis in setting of sepsis. Salicylate level WNL  Chronic Kidney Disease Stage 3 - Cr on admission was 1.25 below baseline 1.3.  Diet: Dysphagia  DVT Ppx: None, now comfort care Code: DNR/DNI  Dispo: Disposition is deferred at this time, awaiting improvement of current medical problems.  Anticipated discharge in approximately  day(s).   The patient does have a current PCP Duwaine Maxin, DO) and does need an [facility] hospital follow-up appointment after discharge.  The patient does have transportation limitations that hinder transportation to clinic appointments.  .Services Needed at time of discharge: Y = Yes, Blank = No PT:   OT:   RN:   Equipment:   Other:     LOS: 4 days   Jerene Pitch, MD 11/03/2013, 4:38 PM

## 2013-11-03 NOTE — Progress Notes (Signed)
Palliative Medicine Team Progress Note  S: Kim [name] is mostly unresponsive she moans when I barely touch her- she has massive anasarca and joint deformities from RA.   Filed Vitals:   11/03/13 1412  BP: 80/46  Pulse: 109  Temp: 86.3 F (30.2 C)  Resp: 12   +course  rhonchi , very sensitive to even light touch, slightly increased WOB  A: Kim [name] is actively dying of Sepsis secondary to an infected decubitus and surrounding cellulitis and myositis and also has massive anasarca from severe Right Heart Failure. She is having periods of apnea.  I spoke with Kim [name] son and her daughter Kim [name] to update them on her condition and to further clarify goals of care. Her daughter Kim [name] was having a difficult time understanding her decline and felt like on admission that she was told she was going to be coming back home in a few days-but she says that she now understands that her mother is EOL and is accepting of that. She is full comfort care-they are all in agreement.    Continue PRN hydromorphone- may need to transition her to a continuous infusion  Anticipate hospital death  Full comfort care  35 minutes. Greater than 50%  of this time was spent counseling and coordinating care related to the above assessment and plan.  Lane Hacker, DO Palliative Medicine

## 2013-11-06 LAB — CULTURE, BLOOD (ROUTINE X 2): Culture: NO GROWTH

## 2013-11-09 NOTE — Discharge Summary (Signed)
Name: Kim [name] MRN: [id number] DOB: 1929-01-10 80 y.o.  Date of Admission: 11-03-13  8:53 PM Date of Discharge: Nov 08, 2013  2:25AM Attending Physician: Karren Cobble, MD   Discharge Diagnosis: Principal Problem:   Sepsis Active Problems:   Atrial fibrillation with RVR   Edema-right upper ext    Rheumatoid arthritis   Elevated lactic acid level   Pulmonary hypertension   Failure to thrive   Sacral decubitus ulcer, stage [name]   Anasarca   Palliative care patient   Cause of death: septic shock leading to multisystem organ failure  Time of death: 2:25 AM  Disposition and follow-up:   Kim [name] was discharged from [facility] in expired condition.    Hospital Course:  Septic shock most likely due to Upper Sacrum Stage 2 wound and Unstageable Right Hip Wound Infection - Pt presented with altered mental status in setting of sepsis (fever, tachycardia, tachypnea, leukocytosis, elevated lactic acid, and AMS) with upper sacral & right hip wounds. Pt remained afebrile during hospitalization with persistent leukocytosis and blood cultures from 1/2 bottles on 12/31 with coag neg staph, most likely a concomitant. Urine cultures were negative. Xray and MRI pelvis did not reveal evidence of osteomyelitis. Pt continued to have mental status decline without improvement in septic state. Palliative care was consulted and son agreed for full comfort care measures for his mother. Pt received IV vancomycin and zosyn during hospitalization until comfort care was initiated. IVF's were administered as needed for hypotension, however no longer given after comfort care was initiated. Pt's blood pressure began to decline with suspected mulit-organ failure leading to death in her sleep on 2013/11/08. At approximately 2:26 AM she was found by nurse without pulse, blood pressure, or respirations. Pt's code status was DNR/DNI and this wish was honored.    Uncontrolled  Rheumatoid Arthritis with severe deformities and resulting immbolization - Pt was immobilized per family since April and barely able to move due to >20-year history of RA without Rheumatology follow-up or DMARD use. Pt's last flare was 1 month ago requiring hospitalization that was treated with short course of corticosteroids. Pt received OT and PT  but could not tolerate therapies.  Pt  received pain medications throughout hospitalization and once pt became palliative care, pt received pain medications, anti-emetic control, and anxiety control until expiration. .  Acute on Chronic Kidney Disease Stage 3 - Cr on admission of 1.25 below baseline 1.3 with worsening renal function to Cr of 1.75 most likely due to septic shock.   Atrial Fibrillation - Pt remained in atrial fibrillation during hospitalization until time of expiration. Pt was continued on home diltiazem and metoprolol succinate for rate control and xarelto for anti-coagulation until comfort care was initiated. .   Grade 1 Diastolic Congestive Heart Failure - Pt with grade 1 diastolic CHF per 2D-echo on 06/27/13. Pt with anasarca and mild pulmonary vascular congestion . Pt received minimal IVF's that were later withdrawn due to comfort care. Pt was continued on home metoprolol succinate and furosemide during hospitalization until comfort care was initiated.      Hypertension - Pt with blood pressure range between 64/58 -141/120 during hospitalization. Pt was continued on home metoprolol succinate daily, furosemide BID, and isosorbide-hydralazine BID during hospitalization until comfort care was initiated. Pt had episodes of hypotension with administration of IVF's until comfort care was administered.   Failure to thrive due to severe malnutrition - Pt with hypoalbuminemia with BMI of 26.1 and intake <75% x  at least 1 month and severe muscle mass loss. Pt with severe RA with severe deformity and resulting immobilization. Pt was continued on regular  diet and feeding supplements during hospitalization with weight loss of 9 lbs.   Normocytic Anemia - Pt with stable hemoglobin during hospitalization with no active bleeding or requirement for blood transfusion. Etiology most likely ACD due to rheumatoid arthritis.    Myoglobinuria - Pt with +Hg dipstick without RBCs with normal CK most likely due to chronic muscle breakdown in setting of immobilization from severe RA.   Anion Gap - Pt with persistent anion gap most likely due to lactic acidosis in setting of sepsis. Salicylate levels were within normal limits.   Gout - Pt without acute flare during hospitalization. Pt was continued on home colchicine daily until comfort care was initiated.     Signed: Juluis Mire, MD 11/09/2013, 8:37 PM

## 2013-12-01 NOTE — Progress Notes (Signed)
[id number]  0225/ nurse went in to check pt.Pt had no pulse/ no blood pressure or respirations/Grandson was asleep on couch/ made him aware/ notified son Tommie Cheetham/ Daughter did not answer phone/[facility] notified and  spoke with [facility] reference number [id number]. The physician on call stated that Dr Juluis Mire would sign the death certificate.  Grandson didn't know the name of a funeral home.  He is going to contact other members and call bed control/ admitting back with information.

## 2013-12-01 DEATH — deceased

## 2015-09-23 IMAGING — CR DG CHEST 1V PORT
1 series · 1 of 1 positions shown · non-contrast
Comparison: July 01, 2013

CLINICAL DATA: Chest pain

EXAM:
PORTABLE CHEST - 1 VIEW

[AP]
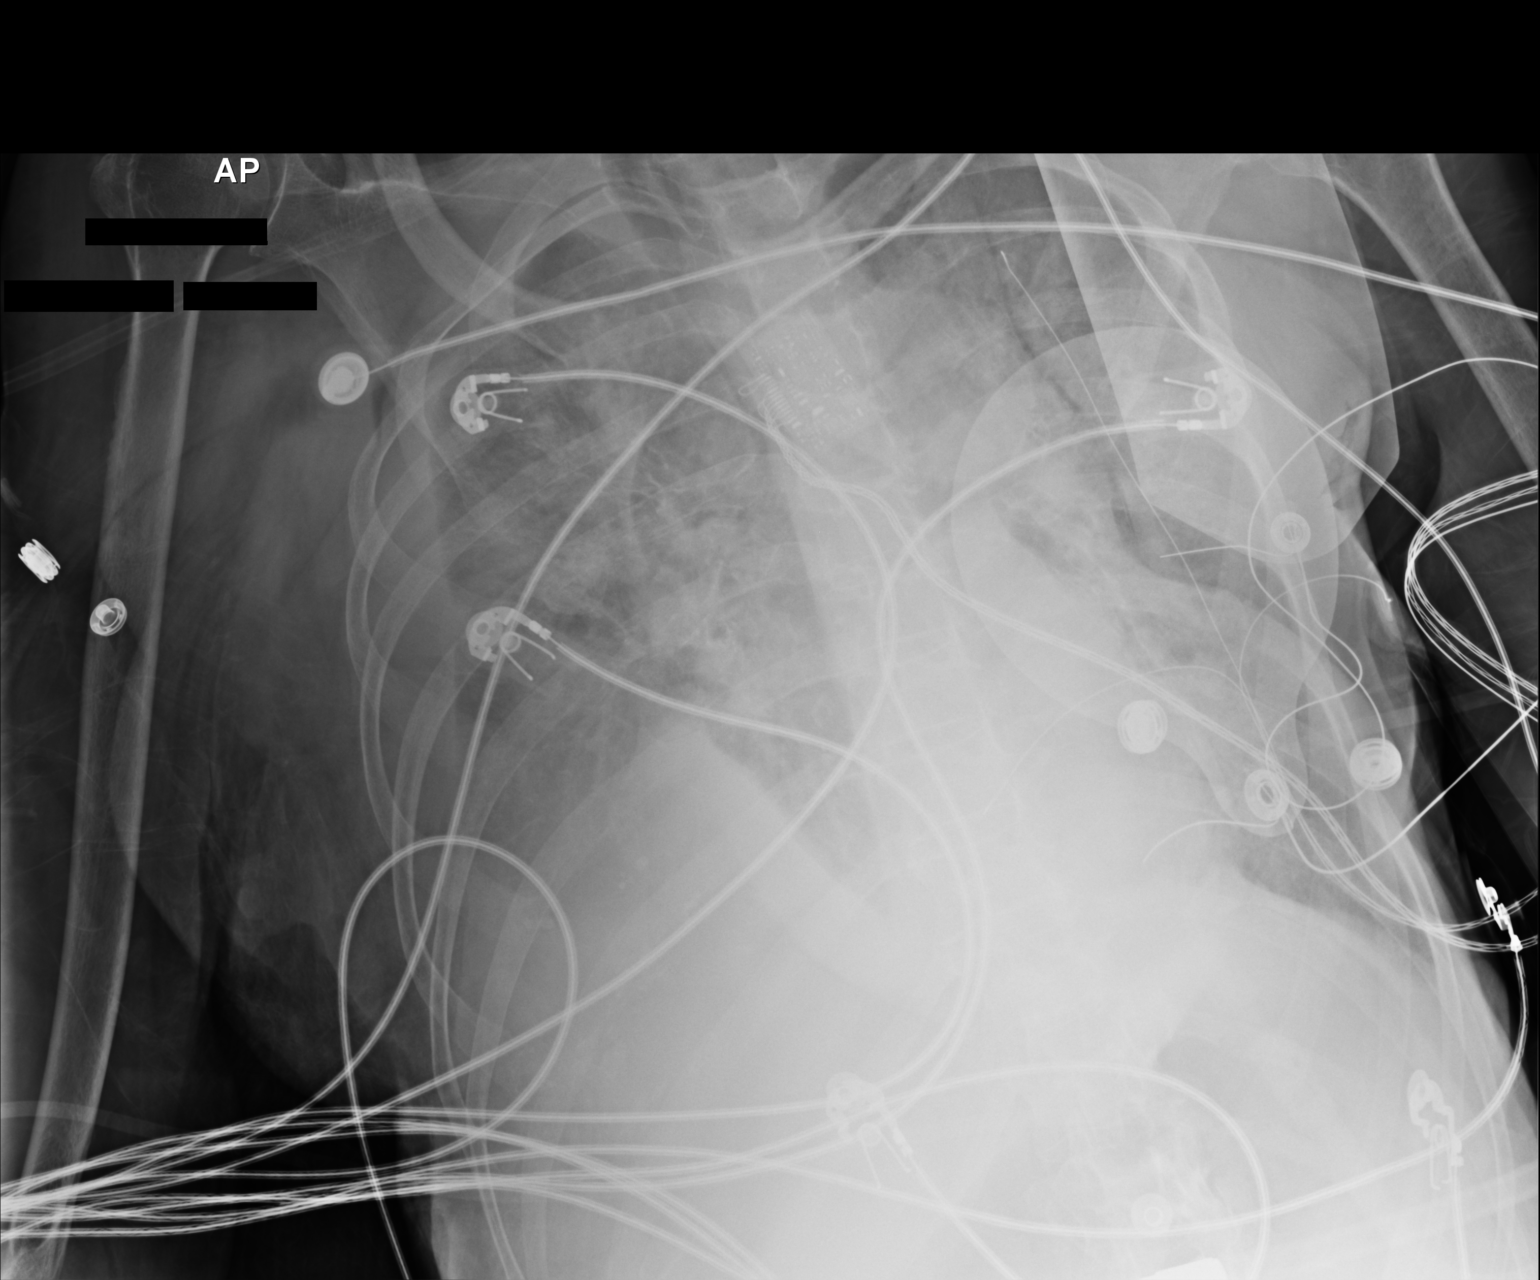

[1 of 1 positions shown; findings below may reference images not displayed]

FINDINGS: There is severe pulmonary edema. There is a moderate right pleural
effusion. Consolidation of the right lung base is identified.
Multiple overlying artifacts are noted. Mediastinal contour and
cardiac silhouette are stable.
IMPRESSION: Severe pulmonary edema.  Moderate right pleural effusion.

## 2015-09-23 IMAGING — CR DG KNEE 1-2V PORT*R*
2 series · 2 of 2 positions shown · non-contrast
Comparison: 11/13/2006

CLINICAL DATA: Pain

EXAM:
PORTABLE RIGHT KNEE - 1-2 VIEW

[AP]
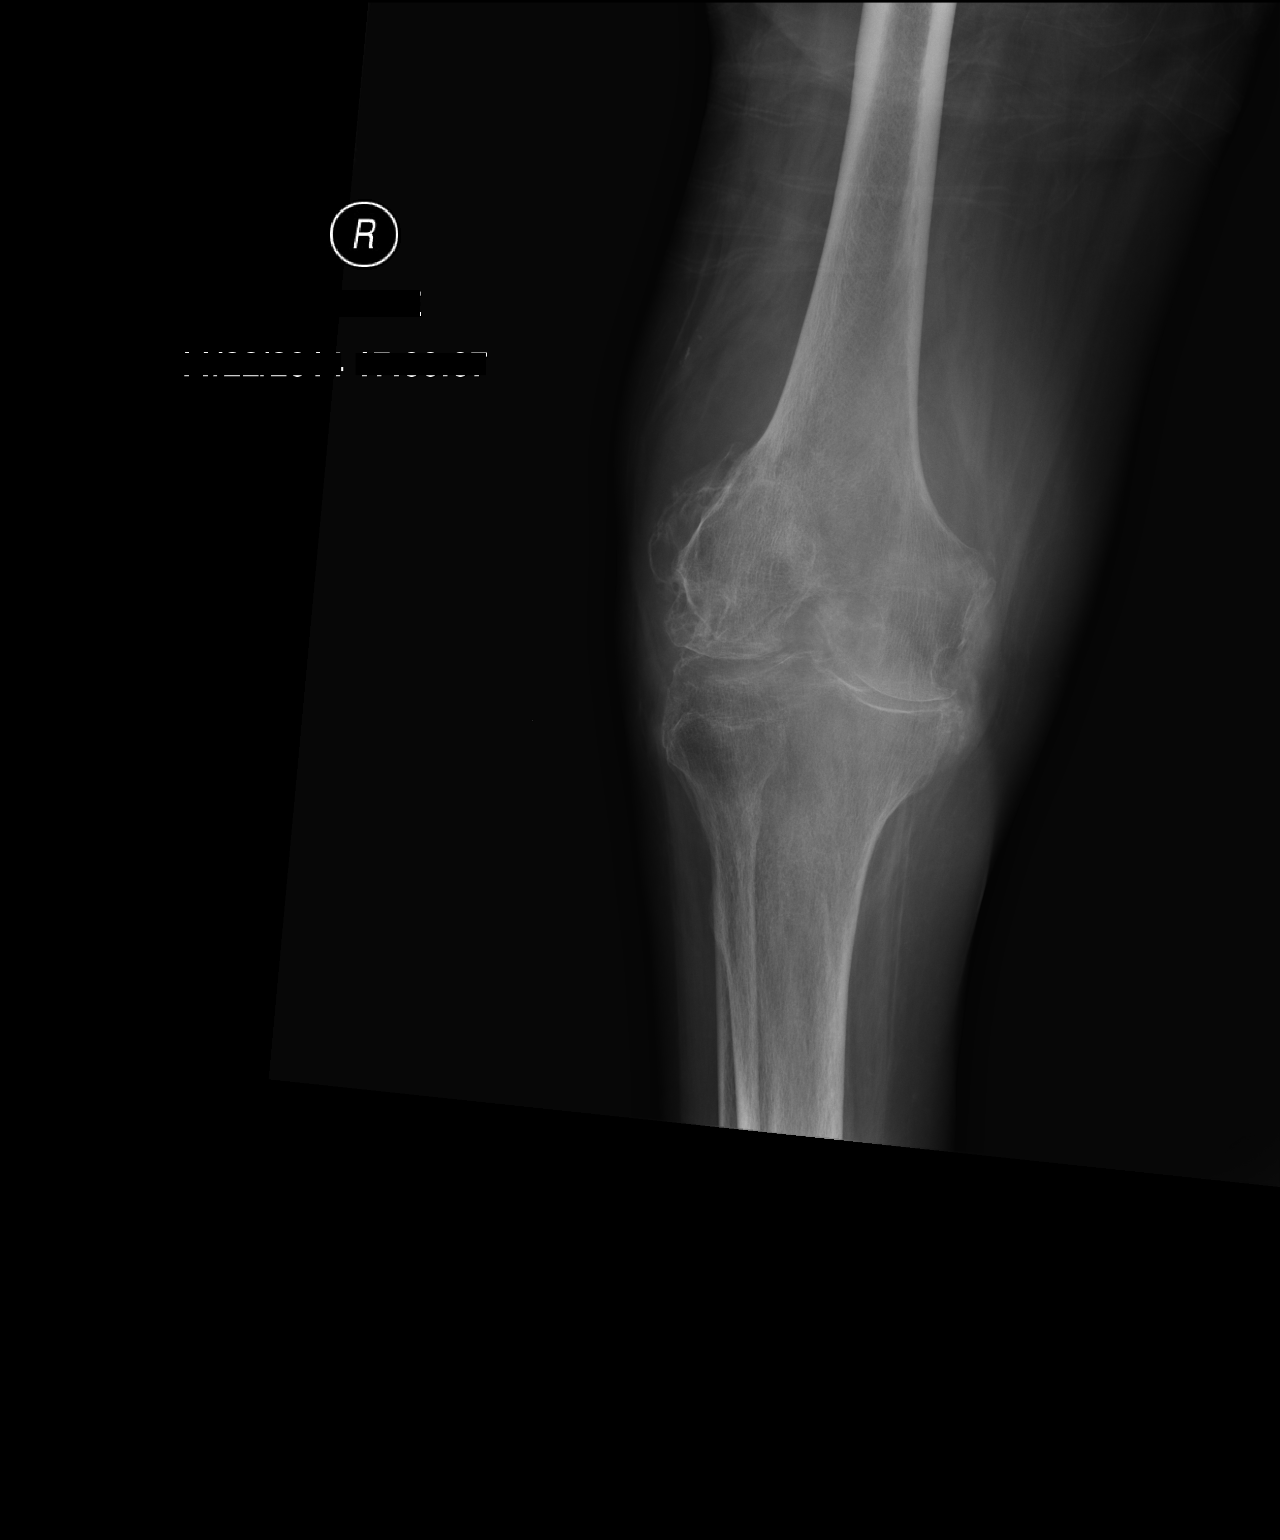

[xtable lateral]
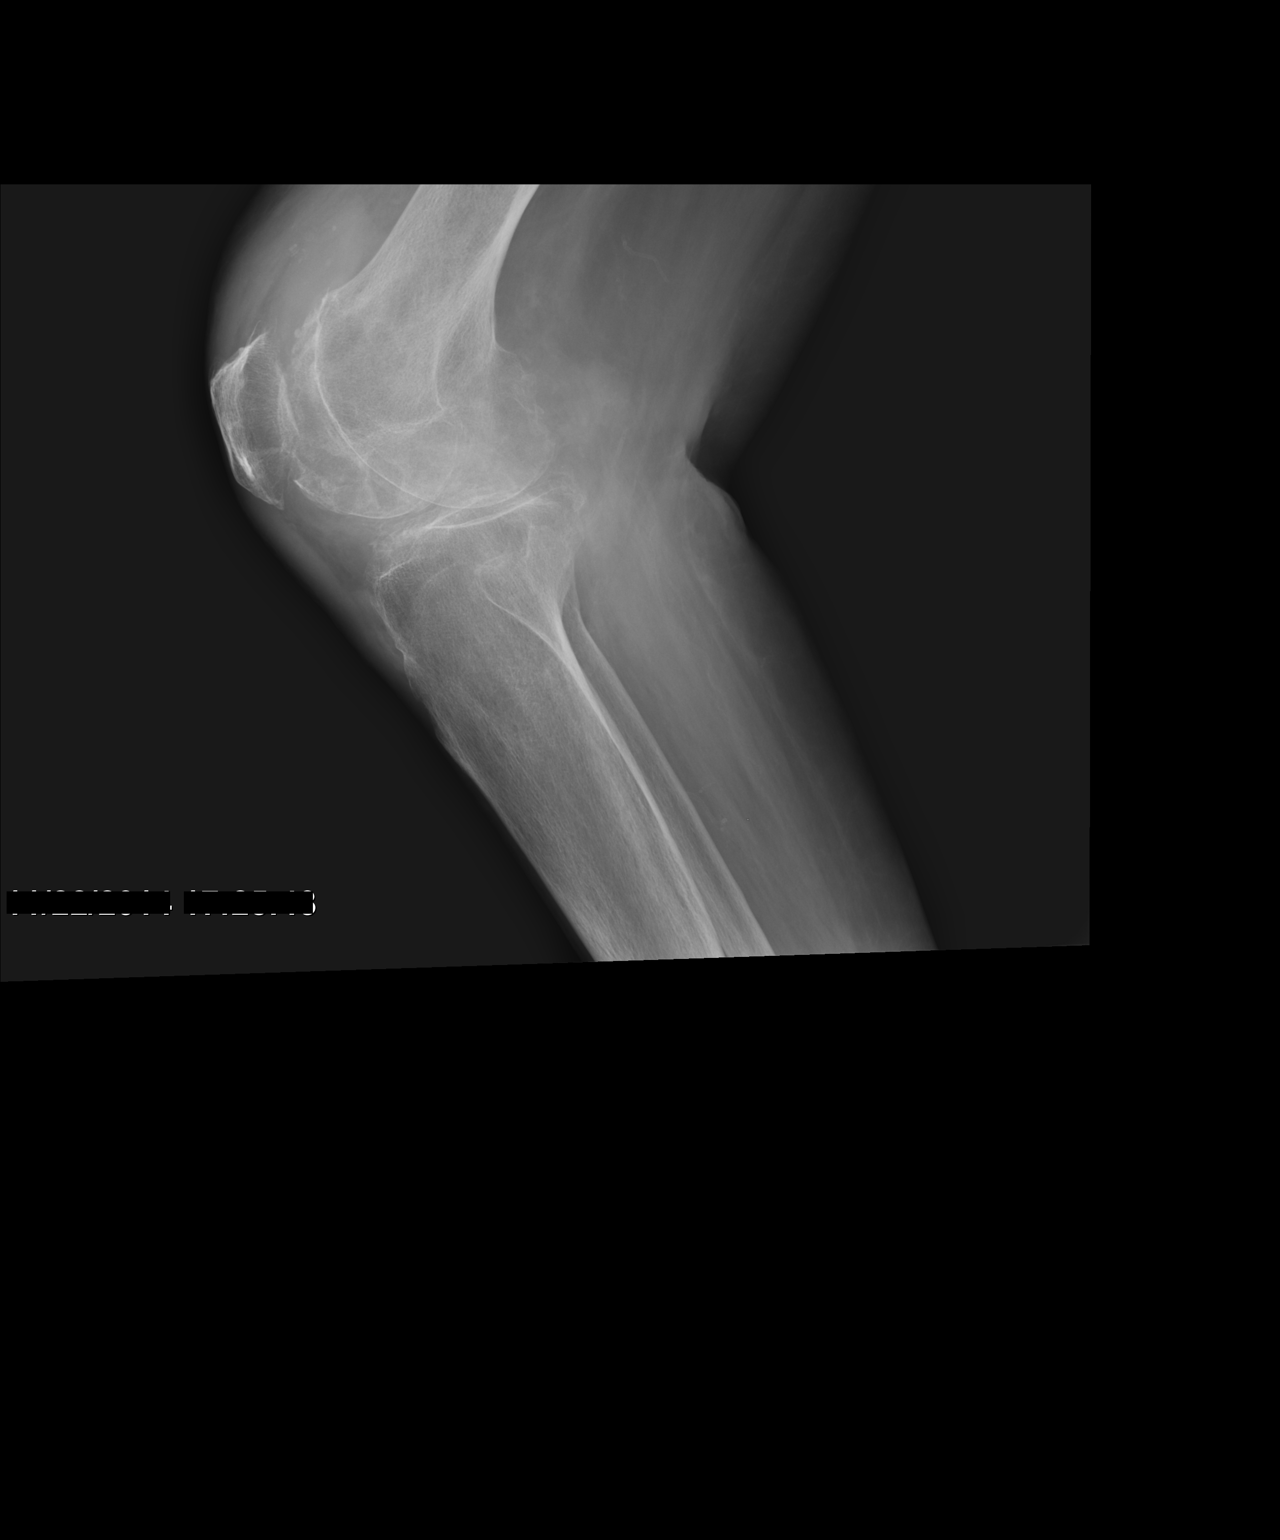

[2 of 2 positions shown; findings below may reference images not displayed]

FINDINGS: There is severe loss of joint space and there is cystic subchondral
degenerative change. There is a joint effusion. There are well
circumscribed erosive changes seen in the anterior condyles as well
as along the medial femoral condyle.
IMPRESSION: Severe degenerative change, with findings similar to prior study
although advanced in severity. As previously indicated, these
findings would be consistent with the clinical diagnosis of
rheumatoid arthritis.

## 2015-09-25 IMAGING — CR DG CHEST 1V PORT
1 series · 1 of 1 positions shown · non-contrast
Comparison: Prior chest x-ray 09/22/2013

CLINICAL DATA: Short of breath, severe pulmonary hypertension ;
pulmonary edema versus pneumonia

EXAM:
PORTABLE CHEST - 1 VIEW

[AP]
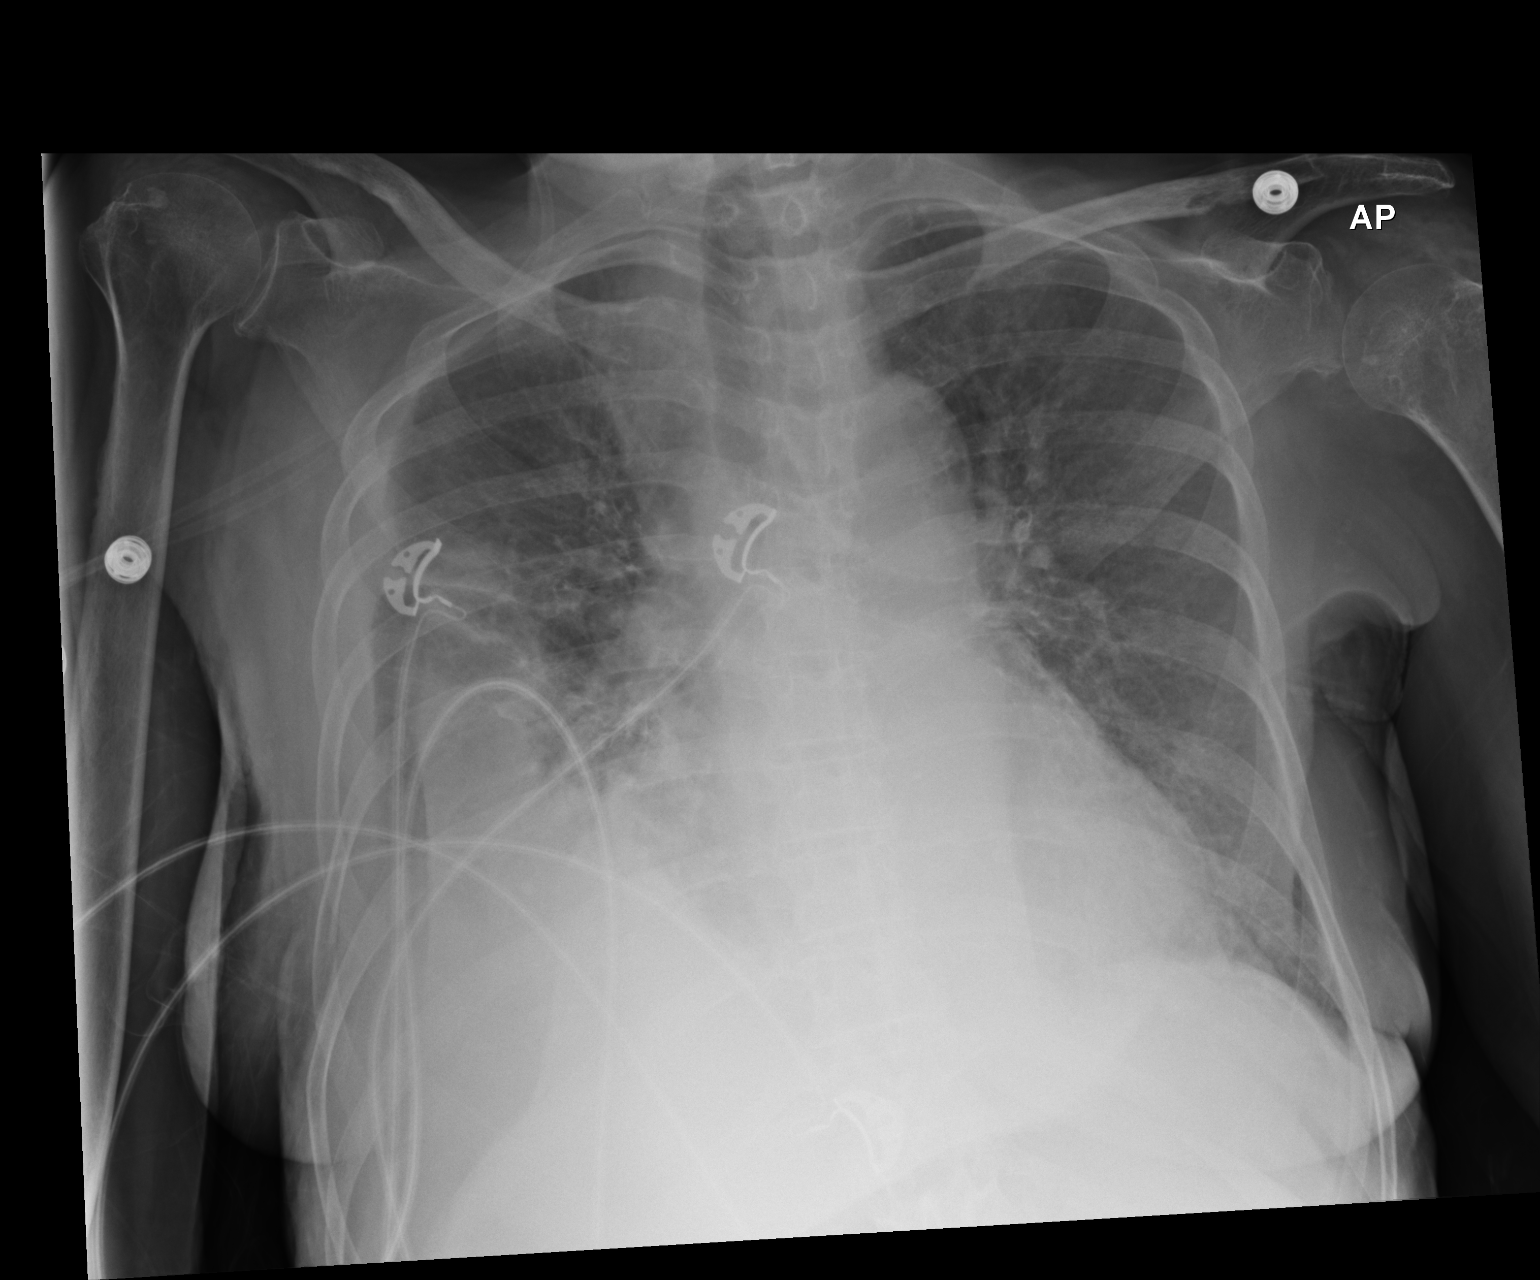

[1 of 1 positions shown; findings below may reference images not displayed]

FINDINGS: Stable cardiomegaly. Slightly increased right pleural effusion and
associated dense right basilar opacity. There may be minimal
pulmonary vascular congestion, but no overt edema. No pneumothorax.
No acute osseous abnormality.
IMPRESSION: Slightly enlarged right pleural effusion and persistent dense right
basilar opacity likely reflecting a combination of pleural fluid,
atelectasis and/or infiltrate.

Minimal vascular congestion without overt edema.
